# Patient Record
Sex: Male | Born: 1965 | Race: Asian | Hispanic: No | Marital: Married | Smoking: Never smoker
Health system: Southern US, Community
[De-identification: ages and names within clinical notes are randomized; demographics above are authoritative.]

## PROBLEM LIST (undated history)

## (undated) DIAGNOSIS — K4090 Unilateral inguinal hernia, without obstruction or gangrene, not specified as recurrent: Secondary | ICD-10-CM

## (undated) DIAGNOSIS — K429 Umbilical hernia without obstruction or gangrene: Secondary | ICD-10-CM

## (undated) DIAGNOSIS — I10 Essential (primary) hypertension: Secondary | ICD-10-CM

## (undated) DIAGNOSIS — I700 Atherosclerosis of aorta: Secondary | ICD-10-CM

## (undated) DIAGNOSIS — N185 Chronic kidney disease, stage 5: Secondary | ICD-10-CM

## (undated) DIAGNOSIS — N189 Chronic kidney disease, unspecified: Secondary | ICD-10-CM

## (undated) DIAGNOSIS — D509 Iron deficiency anemia, unspecified: Secondary | ICD-10-CM

## (undated) DIAGNOSIS — D689 Coagulation defect, unspecified: Secondary | ICD-10-CM

## (undated) DIAGNOSIS — D631 Anemia in chronic kidney disease: Secondary | ICD-10-CM

## (undated) DIAGNOSIS — N2581 Secondary hyperparathyroidism of renal origin: Secondary | ICD-10-CM

## (undated) DIAGNOSIS — D649 Anemia, unspecified: Secondary | ICD-10-CM

## (undated) DIAGNOSIS — Q613 Polycystic kidney, unspecified: Secondary | ICD-10-CM

## (undated) DIAGNOSIS — N184 Chronic kidney disease, stage 4 (severe): Secondary | ICD-10-CM

## (undated) HISTORY — DX: Essential (primary) hypertension: I10

## (undated) HISTORY — DX: Polycystic kidney, unspecified: Q61.3

## (undated) HISTORY — DX: Coagulation defect, unspecified: D68.9

## (undated) HISTORY — DX: Chronic kidney disease, unspecified: N18.9

---

## 2020-03-21 ENCOUNTER — Other Ambulatory Visit: Payer: Self-pay

## 2020-03-21 ENCOUNTER — Inpatient Hospital Stay
Admission: EM | Admit: 2020-03-21 | Discharge: 2020-03-26 | DRG: 300 | Disposition: A | Discharge status: Home / Self Care | Payer: Self-pay | Attending: Internal Medicine | Admitting: Internal Medicine

## 2020-03-21 ENCOUNTER — Emergency Department: Payer: Self-pay

## 2020-03-21 DIAGNOSIS — I10 Essential (primary) hypertension: Secondary | ICD-10-CM | POA: Diagnosis present

## 2020-03-21 DIAGNOSIS — R0602 Shortness of breath: Secondary | ICD-10-CM

## 2020-03-21 DIAGNOSIS — R81 Glycosuria: Secondary | ICD-10-CM | POA: Diagnosis present

## 2020-03-21 DIAGNOSIS — I7100 Dissection of unspecified site of aorta: Secondary | ICD-10-CM | POA: Diagnosis present

## 2020-03-21 DIAGNOSIS — I161 Hypertensive emergency: Secondary | ICD-10-CM | POA: Diagnosis present

## 2020-03-21 DIAGNOSIS — Z0184 Encounter for antibody response examination: Secondary | ICD-10-CM

## 2020-03-21 DIAGNOSIS — Q612 Polycystic kidney, adult type: Secondary | ICD-10-CM

## 2020-03-21 DIAGNOSIS — T508X5A Adverse effect of diagnostic agents, initial encounter: Secondary | ICD-10-CM | POA: Diagnosis present

## 2020-03-21 DIAGNOSIS — B951 Streptococcus, group B, as the cause of diseases classified elsewhere: Secondary | ICD-10-CM | POA: Diagnosis present

## 2020-03-21 DIAGNOSIS — G479 Sleep disorder, unspecified: Secondary | ICD-10-CM | POA: Diagnosis not present

## 2020-03-21 DIAGNOSIS — Z20822 Contact with and (suspected) exposure to covid-19: Secondary | ICD-10-CM | POA: Diagnosis present

## 2020-03-21 DIAGNOSIS — N179 Acute kidney failure, unspecified: Secondary | ICD-10-CM | POA: Diagnosis present

## 2020-03-21 DIAGNOSIS — Y92239 Unspecified place in hospital as the place of occurrence of the external cause: Secondary | ICD-10-CM | POA: Diagnosis present

## 2020-03-21 DIAGNOSIS — D649 Anemia, unspecified: Secondary | ICD-10-CM | POA: Diagnosis present

## 2020-03-21 DIAGNOSIS — R079 Chest pain, unspecified: Secondary | ICD-10-CM

## 2020-03-21 DIAGNOSIS — I7410 Embolism and thrombosis of unspecified parts of aorta: Secondary | ICD-10-CM

## 2020-03-21 DIAGNOSIS — I700 Atherosclerosis of aorta: Secondary | ICD-10-CM | POA: Diagnosis present

## 2020-03-21 DIAGNOSIS — Z888 Allergy status to other drugs, medicaments and biological substances status: Secondary | ICD-10-CM

## 2020-03-21 DIAGNOSIS — I7101 Dissection of thoracic aorta: Principal | ICD-10-CM | POA: Diagnosis present

## 2020-03-21 HISTORY — DX: Embolism and thrombosis of unspecified parts of aorta: I74.10

## 2020-03-21 LAB — CBC WITH DIFFERENTIAL/PLATELET
Abs Immature Granulocytes: 0.04 10*3/uL (ref 0.00–0.07)
Basophils Absolute: 0 10*3/uL (ref 0.0–0.1)
Basophils Relative: 0 %
Eosinophils Absolute: 0.1 10*3/uL (ref 0.0–0.5)
Eosinophils Relative: 1 %
HCT: 33.5 % — ABNORMAL LOW (ref 39.0–52.0)
Hemoglobin: 10.9 g/dL — ABNORMAL LOW (ref 13.0–17.0)
Immature Granulocytes: 1 %
Lymphocytes Relative: 16 %
Lymphs Abs: 1.4 10*3/uL (ref 0.7–4.0)
MCH: 29.9 pg (ref 26.0–34.0)
MCHC: 32.5 g/dL (ref 30.0–36.0)
MCV: 91.8 fL (ref 80.0–100.0)
Monocytes Absolute: 0.7 10*3/uL (ref 0.1–1.0)
Monocytes Relative: 8 %
Neutro Abs: 6.7 10*3/uL (ref 1.7–7.7)
Neutrophils Relative %: 74 %
Platelets: 165 10*3/uL (ref 150–400)
RBC: 3.65 MIL/uL — ABNORMAL LOW (ref 4.22–5.81)
RDW: 13.3 % (ref 11.5–15.5)
WBC: 8.8 10*3/uL (ref 4.0–10.5)
nRBC: 0 % (ref 0.0–0.2)

## 2020-03-21 LAB — COMPREHENSIVE METABOLIC PANEL
ALT: 17 U/L (ref 0–44)
AST: 23 U/L (ref 15–41)
Albumin: 3.9 g/dL (ref 3.5–5.0)
Alkaline Phosphatase: 40 U/L (ref 38–126)
Anion gap: 7 (ref 5–15)
BUN: 54 mg/dL — ABNORMAL HIGH (ref 6–20)
CO2: 24 mmol/L (ref 22–32)
Calcium: 8.1 mg/dL — ABNORMAL LOW (ref 8.9–10.3)
Chloride: 103 mmol/L (ref 98–111)
Creatinine, Ser: 2.8 mg/dL — ABNORMAL HIGH (ref 0.61–1.24)
GFR, Estimated: 26 mL/min — ABNORMAL LOW (ref >60)
Glucose, Bld: 111 mg/dL — ABNORMAL HIGH (ref 70–99)
Potassium: 3.6 mmol/L (ref 3.5–5.1)
Sodium: 134 mmol/L — ABNORMAL LOW (ref 135–145)
Total Bilirubin: 0.7 mg/dL (ref 0.3–1.2)
Total Protein: 6.9 g/dL (ref 6.5–8.1)

## 2020-03-21 LAB — TROPONIN I (HIGH SENSITIVITY): Troponin I (High Sensitivity): 7 ng/L (ref <18)

## 2020-03-21 MED ORDER — SODIUM CHLORIDE 0.9 % IV BOLUS (SEPSIS)
1000.0000 mL | Freq: Once | INTRAVENOUS | Status: AC
  Administered 2020-03-21: 1000 mL via INTRAVENOUS

## 2020-03-21 MED ORDER — MORPHINE SULFATE (PF) 4 MG/ML IV SOLN
4.0000 mg | Freq: Once | INTRAVENOUS | Status: AC
  Administered 2020-03-21: 4 mg via INTRAVENOUS
  Filled 2020-03-21: qty 1

## 2020-03-21 MED ORDER — ONDANSETRON HCL 4 MG/2ML IJ SOLN
4.0000 mg | Freq: Once | INTRAMUSCULAR | Status: AC
  Administered 2020-03-21: 4 mg via INTRAVENOUS
  Filled 2020-03-21: qty 2

## 2020-03-21 MED ORDER — IOHEXOL 350 MG/ML SOLN
80.0000 mL | Freq: Once | INTRAVENOUS | Status: AC | PRN
  Administered 2020-03-21: 80 mL via INTRAVENOUS

## 2020-03-21 MED ORDER — LABETALOL HCL 5 MG/ML IV SOLN
5.0000 mg | Freq: Once | INTRAVENOUS | Status: AC
  Administered 2020-03-21: 5 mg via INTRAVENOUS
  Filled 2020-03-21: qty 4

## 2020-03-21 NOTE — ED Provider Notes (Addendum)
[facility] Emergency Department Provider Note  ____________________________________________   Event Date/Time   First MD Initiated Contact with Patient 03/21/20 2303     (approximate)  I have reviewed the triage vital signs and the nursing notes.   HISTORY  Chief Complaint Back Pain    HPI Trevor Meyer is a 55 y.o. male with no significant past medical history who presents to the emergency department with complaints of chest, back and abdominal pain that started today.  He states pain started in his thoracic and lower back around 7:30 AM while taking a shower.  He denies any injury to his back, falls.  States pain then radiated into his abdomen, chest and into his throat.  He describes it as sharp, severe.  He has never had pain like this before.  He denies numbness, tingling, focal weakness, bowel or bladder incontinence, urinary retention.  States he has had chills but no fever.  No cough, vomiting or diarrhea.  Denies shortness of breath.  He states pain is worse with lying flat.  No alleviating factors.  He has no chronic medical issues.  No history of aortic pathology for himself or in his family.        History reviewed. No pertinent past medical history.  There are no problems to display for this patient.   History reviewed. No pertinent surgical history.  Prior to Admission medications   Not on File    Allergies Patient has no allergy information on record.  History reviewed. No pertinent family history.  Social History Social History   Tobacco Use  . Smoking status: Never Smoker  . Smokeless tobacco: Never Used  Substance Use Topics  . Alcohol use: Never    Review of Systems Constitutional: No fever. Eyes: No visual changes. ENT: No sore throat. Cardiovascular: + chest pain. Respiratory: Denies shortness of breath. Gastrointestinal: No nausea, vomiting, diarrhea. Genitourinary: Negative for dysuria. Musculoskeletal:  Negative for back pain. Skin: Negative for rash. Neurological: Negative for focal weakness or numbness.  ____________________________________________   PHYSICAL EXAM:  VITAL SIGNS: ED Triage Vitals  Enc Vitals Group     BP 03/21/20 2137 (!) 165/96     Pulse Rate 03/21/20 2137 75     Resp 03/21/20 2137 18     Temp 03/21/20 2137 98.2 F (36.8 C)     Temp Source 03/21/20 2137 Oral     SpO2 03/21/20 2136 100 %     Weight 03/21/20 2138 148 lb (67.1 kg)     Height 03/21/20 2138 '5\' 6"'$  (1.676 m)     Head Circumference --      Peak Flow --      Pain Score 03/21/20 2138 7     Pain Loc --      Pain Edu? --      Excl. in [place]? --    CONSTITUTIONAL: Alert and oriented and responds appropriately to questions. Well-appearing; well-nourished HEAD: Normocephalic EYES: Conjunctivae clear, pupils appear equal, EOM appear intact ENT: normal nose; moist mucous membranes, normal phonation NECK: Supple, normal ROM, no meningismus CARD: RRR; S1 and S2 appreciated; no murmurs, no clicks, no rubs, no gallops RESP: Normal chest excursion without splinting or tachypnea; breath sounds clear and equal bilaterally; no wheezes, no rhonchi, no rales, no hypoxia or respiratory distress, speaking full sentences ABD/GI: Normal bowel sounds; non-distended; soft, non-tender, no rebound, no guarding, no peritoneal signs, no hepatosplenomegaly BACK: The back appears normal, no midline spinal tenderness or step-off or deformity  EXT: Normal ROM in all joints; no deformity noted, no edema; no cyanosis, no calf tenderness or calf swelling, 2+ radial and DP pulses bilaterally SKIN: Normal color for age and race; warm; no rash on exposed skin NEURO: Moves all extremities equally, reports normal sensation diffusely, no saddle anesthesia, normal speech, no facial asymmetry PSYCH: The patient's mood and manner are appropriate.  ____________________________________________   LABS (all labs ordered are listed, but only  abnormal results are displayed)  Labs Reviewed  CBC WITH DIFFERENTIAL/PLATELET - Abnormal; Notable for the following components:      Result Value   RBC 3.65 (*)    Hemoglobin 10.9 (*)    HCT 33.5 (*)    All other components within normal limits  COMPREHENSIVE METABOLIC PANEL - Abnormal; Notable for the following components:   Sodium 134 (*)    Glucose, Bld 111 (*)    BUN 54 (*)    Creatinine, Ser 2.80 (*)    Calcium 8.1 (*)    GFR, Estimated 26 (*)    All other components within normal limits  LIPASE, BLOOD - Abnormal; Notable for the following components:   Lipase 60 (*)    All other components within normal limits  RESP PANEL BY RT-PCR (FLU A&B, COVID) ARPGX2  URINALYSIS, ROUTINE W REFLEX MICROSCOPIC  PROTIME-INR  APTT  TYPE AND SCREEN  TROPONIN I (HIGH SENSITIVITY)  TROPONIN I (HIGH SENSITIVITY)   ____________________________________________  EKG   EKG Interpretation  Date/Time:  Saturday March 21 2020 21:43:40 EST Ventricular Rate:  78 PR Interval:  154 QRS Duration: 80 QT Interval:  362 QTC Calculation: 412 R Axis:   17 Text Interpretation: Normal sinus rhythm Nonspecific T wave abnormality Abnormal ECG No old tracing to compare Confirmed by Pryor Curia [phone]) on 03/21/2020 11:05:37 PM       ____________________________________________  RADIOLOGY Jessie Foot Cartier Mapel, personally viewed and evaluated these images (plain radiographs) as part of my medical decision making, as well as reviewing the written report by the radiologist.  ED MD interpretation: Intramural aortic hematoma  Official radiology report(s): DG Chest 2 View  Result Date: 03/21/2020 CLINICAL DATA:  Thoracic back pain upon awakening this morning, intermittent chest pain EXAM: CHEST - 2 VIEW COMPARISON:  None. FINDINGS: Frontal and lateral views of the chest demonstrate an unremarkable cardiac silhouette. There is ectasia of the thoracic aorta, with prominence of the aortic knob. I do not have  any prior studies for direct comparison. If aortic pathology is suspected, CT angiography of the chest is recommended. No airspace disease, effusion, or pneumothorax. No acute bony abnormalities. IMPRESSION: 1. Dilated ectatic aortic arch. In light of patient's presenting symptoms and lack of comparison studies, CT angiography of the chest may be useful to assess for underlying aortic pathology. 2. No acute airspace disease. Electronically Signed   By: Randa Ngo M.D.   On: 03/21/2020 22:23   CT Angio Chest/Abd/Pel for Dissection W and/or Wo Contrast  Result Date: 03/22/2020 CLINICAL DATA:  Intermittent chest pain EXAM: CT ANGIOGRAPHY CHEST, ABDOMEN AND PELVIS TECHNIQUE: Non-contrast CT of the chest was initially obtained. Multidetector CT imaging through the chest, abdomen and pelvis was performed using the standard protocol during bolus administration of intravenous contrast. Multiplanar reconstructed images and MIPs were obtained and reviewed to evaluate the vascular anatomy. CONTRAST:  40m OMNIPAQUE IOHEXOL 350 MG/ML SOLN COMPARISON:  None. FINDINGS: CTA CHEST FINDINGS Cardiovascular: --Heart: The heart size is normal.  There is nopericardial effusion. --Aorta: There is crescentic hyperdensity around the  left lateral aspect of the thoracic aorta, beginning at the origin of the left subclavian artery and extending to the diaphragmatic hiatus. There is a conventional 3 vessel aortic arch branching pattern. The proximal arch vessels are widely patent. --Pulmonary Arteries: Contrast timing is optimized for preferential opacification of the aorta. Within that limitation, normal central pulmonary arteries. Mediastinum/Nodes: No mediastinal, hilar or axillary lymphadenopathy. The visualized thyroid and thoracic esophageal course are unremarkable. Lungs/Pleura: No pulmonary nodules or masses. No pleural effusion or pneumothorax. No focal airspace consolidation. No focal pleural abnormality. Musculoskeletal: No  chest wall abnormality. No acute osseous findings. Review of the MIP images confirms the above findings. CTA ABDOMEN AND PELVIS FINDINGS VASCULAR Aorta: Normal caliber aorta without aneurysm, dissection, vasculitis or hemodynamically significant stenosis. There is aortic atherosclerosis. Celiac: No aneurysm, dissection or hemodynamically significant stenosis. Normal branching pattern. SMA: Widely patent without dissection or stenosis. Renals: No aneurysm, dissection, stenosis or evidence of fibromuscular dysplasia. IMA: Patent without abnormality. Inflow: No aneurysm, stenosis or dissection. Veins: Normal course and caliber of the major veins. Assessment is otherwise limited by the arterial dominant contrast phase. Review of the MIP images confirms the above findings. NON-VASCULAR Hepatobiliary: Normal hepatic contours and density. No visible biliary dilatation. Normal gallbladder. Pancreas: Normal contours without ductal dilatation. No peripancreatic fluid collection. Spleen: Normal arterial phase splenic enhancement pattern. Adrenals/Urinary Tract: --Adrenal glands: Normal. --Right kidney/ureter: Enlarged with numerous large cysts. --Left kidney/ureter: Enlarged with numerous large cysts. --Urinary bladder: Unremarkable. Stomach/Bowel: --Stomach/Duodenum: No hiatal hernia or other gastric abnormality. Normal duodenal course and caliber. --Small bowel: No dilatation or inflammation. --Colon: No focal abnormality. --Appendix: Normal. Lymphatic:  No abdominal or pelvic lymphadenopathy. Reproductive: Normal prostate and seminal vesicles. Musculoskeletal. No bony spinal canal stenosis or focal osseous abnormality. Other: None. Review of the MIP images confirms the above findings. IMPRESSION: 1. Acute aortic syndrome - intramural hematoma extending from the left subclavian artery origin to the diaphragmatic hiatus. 2. Polycystic kidney disease with numerous large cysts. Aortic Atherosclerosis (ICD10-I70.0). Critical  Value/emergent results were called by telephone at the time of interpretation on 03/22/2020 at 12:31 am to provider [facility] , who verbally acknowledged these results. Electronically Signed   By: Ulyses Jarred M.D.   On: 03/22/2020 00:32    ____________________________________________   PROCEDURES  Procedure(s) performed (including Critical Care):  Procedures  CRITICAL CARE Performed by: Cyril Mourning Chelly Dombeck   Total critical care time: 62 minutes  Critical care time was exclusive of separately billable procedures and treating other patients.  Critical care was necessary to treat or prevent imminent or life-threatening deterioration.  Critical care was time spent personally by me on the following activities: development of treatment plan with patient and/or surrogate as well as nursing, discussions with consultants, evaluation of patient's response to treatment, examination of patient, obtaining history from patient or surrogate, ordering and performing treatments and interventions, ordering and review of laboratory studies, ordering and review of radiographic studies, pulse oximetry and re-evaluation of patient's condition.  ____________________________________________   INITIAL IMPRESSION / ASSESSMENT AND PLAN / ED COURSE  As part of my medical decision making, I reviewed the following data within the [place] History obtained from family, Nursing notes reviewed and incorporated, Interpreter needed, Labs reviewed , EKG interpreted , Old EKG reviewed, Old chart reviewed, Radiograph reviewed , A consult was requested and obtained from this/these consultant(s) Critical care and vascular and Notes from prior ED visits         Patient here with concerning story for  aortic pathology.  Chest x-ray obtained in triage shows an ectatic dilated aortic arch.  I feel he needs emergent CT imaging to rule out dissection, aneurysm, rupture.  He is hypertensive at this time with  equal blood pressures on both upper extremities.  No focal neurologic deficits.  His first troponin is 7.  His EKG is nonischemic.  He has no significant risk factors for ACS.  Doubt PE.  He has no focal neurologic deficits.  Low suspicion for cauda equina, spinal stenosis, epidural abscess or hematoma, discitis or osteomyelitis, transverse myelitis.    He does have a creatinine of 2.8 with no old for comparison.  He denies any known history of kidney disease.  Discussed with patient that this could be due to dissection.  I have counseled him extensively on risk and benefits of CT imaging with contrast with his nurse present at bedside.  Patient speaks English but an interpreter was used to clarify any medical terminology.  We will hydrate patient aggressively but he agrees that without further imaging done emergently that we could be missing life-threatening pathology that needs emergent intervention.  He agrees to CT imaging.  I also discussed this case with Dr. Jacalyn Lefevre with [facility] radiology who will agree is given abnormal chest x-ray, concerning story that patient needs CTA emergently.  CT tech aware of patient and will take him immediately after finishing the study on patient that is currently on the table.    Will give labetalol for elevated blood pressure, morphine for pain.  ED PROGRESS  11:41 PM  Pt transported to CT now.  BP improving.  Pain improving.  12:30 AM  Pt's CT reviewed by myself and radiologist Dr. Collins Scotland.  CT scan shows intramural hematoma extending from the left subclavian artery to the diaphragm without dissection, aneurysm, rupture, ulcer.  He has polycystic kidney disease as well.  Discussed with Dr. Bridgett Larsson on-call for vascular surgery.  He states this would be managed medically like a type B dissection with strict blood pressure control.  We will start him on IV esmolol and add nitroprusside as needed.  He recommends admission to critical care here at [facility].  States that this would  not be surgical at this time.  Recommends repeat imaging in 2 to 3 days as patient may develop a dissection at that time.  He states if patient does need surgical intervention it would be an endograft which she is not sure if this would be able to be done at [facility] or not but feels that given patient does not need emergent surgical evaluation now, he can be admitted here.  12:45 AM Spoke with Olga Millers, NP with critical care who accepts patient for admission.  Patient and family updated.  I suspect that this intramural hematoma is likely due to small intimal tears due to underlying hypertension from his polycystic kidney disease which he was unaware of.  1:30 AM  No significant improvement with esmolol.  We will continue to titrate and bolus as needed.  ICU provider at bedside.  Will likely need to start nitroprusside.  ICU team to assume care.  I reviewed all nursing notes and pertinent previous records as available.  I have reviewed and interpreted any EKGs, lab and urine results, imaging (as available).  ____________________________________________   FINAL CLINICAL IMPRESSION(S) / ED DIAGNOSES  Final diagnoses:  Intramural aortic hematoma [facility])     ED Discharge Orders    None      *Please note:  Trevor  Romel Meyer was evaluated in Emergency Department on 03/22/2020 for the symptoms described in the history of present illness. He was evaluated in the context of the global COVID-19 pandemic, which necessitated consideration that the patient might be at risk for infection with the SARS-CoV-2 virus that causes COVID-19. Institutional protocols and algorithms that pertain to the evaluation of patients at risk for COVID-19 are in a state of rapid change based on information released by regulatory bodies including the CDC and federal and state organizations. These policies and algorithms were followed during the patient's care in the ED.  Some ED evaluations and interventions may be delayed as  a result of limited staffing during and the pandemic.*   Note:  This document was prepared using Dragon voice recognition software and may include unintentional dictation errors.   Marinell Igarashi, Delice Bison, DO 03/22/20 0155    Khamari Sheehan, Delice Bison, DO 03/22/20 [place]:[id number]

## 2020-03-21 NOTE — ED Notes (Addendum)
Pt reports sharp/pressure pain in back, abdomen, and chest. Pt reports that pain began in throat and then moved to the back. Reports SOB when lying down only. Denies any numbness or tingling. Denies recent injury to back. No pain to back on palpation. Pt alert and oriented x4. Denies any cough, fever, or changes in bowel or bladder pattern.

## 2020-03-21 NOTE — ED Triage Notes (Signed)
Thoracic back pain without fall or injury. Reports onset upon waking this morning. Denies hx of back surgeries. Denies numbness or tingling in extremities. Reports non-specific cold like symptoms but also denies cough, congestion, or fever. Denies generalized body aches. Pt alert and oriented x4, breathing unlabored in NAD.

## 2020-03-21 NOTE — ED Notes (Signed)
Bilateral blood pressure obtained. Right arm 188/112. Left arm 188/101.

## 2020-03-21 NOTE — ED Triage Notes (Signed)
Pt now reporting intermittent chest pain and states that when pt lays flat chest pain occurs.

## 2020-03-21 NOTE — ED Notes (Signed)
MD at bedside. Translator used to communicate with pt to explain plan of care and assess.

## 2020-03-22 ENCOUNTER — Encounter: Payer: Self-pay | Admitting: Pulmonary Disease

## 2020-03-22 DIAGNOSIS — I776 Arteritis, unspecified: Secondary | ICD-10-CM

## 2020-03-22 DIAGNOSIS — I7100 Dissection of unspecified site of aorta: Secondary | ICD-10-CM | POA: Diagnosis present

## 2020-03-22 LAB — CBC
HCT: 29.5 % — ABNORMAL LOW (ref 39.0–52.0)
Hemoglobin: 9.6 g/dL — ABNORMAL LOW (ref 13.0–17.0)
MCH: 30.2 pg (ref 26.0–34.0)
MCHC: 32.5 g/dL (ref 30.0–36.0)
MCV: 92.8 fL (ref 80.0–100.0)
Platelets: 135 10*3/uL — ABNORMAL LOW (ref 150–400)
RBC: 3.18 MIL/uL — ABNORMAL LOW (ref 4.22–5.81)
RDW: 13.2 % (ref 11.5–15.5)
WBC: 7.9 10*3/uL (ref 4.0–10.5)
nRBC: 0 % (ref 0.0–0.2)

## 2020-03-22 LAB — LIPASE, BLOOD: Lipase: 60 U/L — ABNORMAL HIGH (ref 11–51)

## 2020-03-22 LAB — TYPE AND SCREEN
ABO/RH(D): B POS
Antibody Screen: NEGATIVE

## 2020-03-22 LAB — BASIC METABOLIC PANEL
Anion gap: 6 (ref 5–15)
BUN: 45 mg/dL — ABNORMAL HIGH (ref 6–20)
CO2: 22 mmol/L (ref 22–32)
Calcium: 7.6 mg/dL — ABNORMAL LOW (ref 8.9–10.3)
Chloride: 109 mmol/L (ref 98–111)
Creatinine, Ser: 2.75 mg/dL — ABNORMAL HIGH (ref 0.61–1.24)
GFR, Estimated: 26 mL/min — ABNORMAL LOW (ref >60)
Glucose, Bld: 130 mg/dL — ABNORMAL HIGH (ref 70–99)
Potassium: 4 mmol/L (ref 3.5–5.1)
Sodium: 137 mmol/L (ref 135–145)

## 2020-03-22 LAB — RESP PANEL BY RT-PCR (FLU A&B, COVID) ARPGX2
Influenza A by PCR: NEGATIVE
Influenza B by PCR: NEGATIVE
SARS Coronavirus 2 by RT PCR: NEGATIVE

## 2020-03-22 LAB — PHOSPHORUS: Phosphorus: 3.2 mg/dL (ref 2.5–4.6)

## 2020-03-22 LAB — APTT: aPTT: 30 seconds (ref 24–36)

## 2020-03-22 LAB — TROPONIN I (HIGH SENSITIVITY): Troponin I (High Sensitivity): 7 ng/L (ref <18)

## 2020-03-22 LAB — GLUCOSE, CAPILLARY
Glucose-Capillary: 99 mg/dL (ref 70–99)
Glucose-Capillary: 138 mg/dL — ABNORMAL HIGH (ref 70–99)
Glucose-Capillary: 143 mg/dL — ABNORMAL HIGH (ref 70–99)
Glucose-Capillary: 115 mg/dL — ABNORMAL HIGH (ref 70–99)
Glucose-Capillary: 121 mg/dL — ABNORMAL HIGH (ref 70–99)

## 2020-03-22 LAB — MRSA PCR SCREENING: MRSA by PCR: NEGATIVE

## 2020-03-22 LAB — HIV ANTIBODY (ROUTINE TESTING W REFLEX): HIV Screen 4th Generation wRfx: NONREACTIVE

## 2020-03-22 LAB — PROTIME-INR
INR: 1.1 (ref 0.8–1.2)
Prothrombin Time: 13.4 seconds (ref 11.4–15.2)

## 2020-03-22 LAB — SEDIMENTATION RATE: Sed Rate: 50 mm/hr — ABNORMAL HIGH (ref 0–20)

## 2020-03-22 LAB — SAR COV2 SEROLOGY (COVID19)AB(IGG),IA: SARS-CoV-2 Ab, IgG: REACTIVE — AB

## 2020-03-22 LAB — MAGNESIUM: Magnesium: 2 mg/dL (ref 1.7–2.4)

## 2020-03-22 LAB — C-REACTIVE PROTEIN: CRP: 4 mg/dL — ABNORMAL HIGH (ref <1.0)

## 2020-03-22 MED ORDER — LABETALOL HCL 100 MG PO TABS
100.0000 mg | ORAL_TABLET | Freq: Two times a day (BID) | ORAL | Status: DC
  Administered 2020-03-22 (×2): 100 mg via ORAL
  Filled 2020-03-22 (×4): qty 1

## 2020-03-22 MED ORDER — ISOSORBIDE MONONITRATE ER 30 MG PO TB24
30.0000 mg | ORAL_TABLET | Freq: Every day | ORAL | Status: DC
  Administered 2020-03-22 – 2020-03-26 (×5): 30 mg via ORAL
  Filled 2020-03-22 (×6): qty 1

## 2020-03-22 MED ORDER — LABETALOL HCL 5 MG/ML IV SOLN: 20.0000 mg | Freq: Once | INTRAVENOUS | Status: AC

## 2020-03-22 MED ORDER — SODIUM NITROPRUSSIDE 25 MG/ML IV SOLN: INTRAVENOUS | Status: DC

## 2020-03-22 MED ORDER — OXYCODONE HCL 5 MG PO TABS
10.0000 mg | ORAL_TABLET | ORAL | Status: DC | PRN
  Administered 2020-03-22: 10 mg via ORAL
  Filled 2020-03-22: qty 2

## 2020-03-22 MED ORDER — ESMOLOL BOLUS VIA INFUSION
500.0000 ug/kg | Freq: Once | INTRAVENOUS | Status: AC
  Administered 2020-03-22: 33550 ug via INTRAVENOUS

## 2020-03-22 MED ORDER — DOCUSATE SODIUM 100 MG PO CAPS: 100.0000 mg | ORAL_CAPSULE | Freq: Two times a day (BID) | ORAL | Status: DC | PRN

## 2020-03-22 MED ORDER — HEPARIN SODIUM (PORCINE) 5000 UNIT/ML IJ SOLN
5000.0000 [IU] | Freq: Three times a day (TID) | INTRAMUSCULAR | Status: DC
  Administered 2020-03-23 – 2020-03-26 (×7): 5000 [IU] via SUBCUTANEOUS
  Filled 2020-03-22 (×8): qty 1

## 2020-03-22 MED ORDER — NITROPRUSSIDE SODIUM-NACL 20-0.9 MG/100ML-% IV SOLN
0.0000 ug/kg/min | INTRAVENOUS | Status: DC
  Administered 2020-03-22: 0.3 ug/kg/min via INTRAVENOUS
  Administered 2020-03-22: 0.4 ug/kg/min via INTRAVENOUS
  Administered 2020-03-23: 1 ug/kg/min via INTRAVENOUS
  Filled 2020-03-22 (×6): qty 100

## 2020-03-22 MED ORDER — ESMOLOL HCL-SODIUM CHLORIDE 2000 MG/100ML IV SOLN
25.0000 ug/kg/min | INTRAVENOUS | Status: DC
  Administered 2020-03-22: 25 ug/kg/min via INTRAVENOUS
  Filled 2020-03-22 (×2): qty 100

## 2020-03-22 MED ORDER — LABETALOL HCL 5 MG/ML IV SOLN
0.5000 mg/min | Status: DC
  Administered 2020-03-22: 0.5 mg/min via INTRAVENOUS
  Administered 2020-03-22: 1 mg/min via INTRAVENOUS
  Filled 2020-03-22 (×2): qty 80

## 2020-03-22 MED ORDER — PANTOPRAZOLE SODIUM 40 MG IV SOLR
40.0000 mg | Freq: Every day | INTRAVENOUS | Status: DC
  Administered 2020-03-22: 40 mg via INTRAVENOUS
  Filled 2020-03-22: qty 40

## 2020-03-22 MED ORDER — CHLORHEXIDINE GLUCONATE CLOTH 2 % EX PADS
6.0000 | MEDICATED_PAD | Freq: Every day | CUTANEOUS | Status: DC
  Administered 2020-03-22 – 2020-03-23 (×2): 6 via TOPICAL

## 2020-03-22 MED ORDER — LABETALOL HCL 5 MG/ML IV SOLN
INTRAVENOUS | Status: AC
  Administered 2020-03-22: 20 mg via INTRAVENOUS
  Filled 2020-03-22: qty 4

## 2020-03-22 MED ORDER — POLYETHYLENE GLYCOL 3350 17 G PO PACK: 17.0000 g | PACK | Freq: Every day | ORAL | Status: DC | PRN

## 2020-03-22 NOTE — H&P (Signed)
NAME:  Trevor Meyer, MRN:  [id number], DOB:  1965/05/04, LOS: 0 ADMISSION DATE:  03/21/2020, CONSULTATION DATE: 03/22/2020 REFERRING MD: Dr. Leonides Schanz, CHIEF COMPLAINT: Back pain  Brief History:  55 year old male with an intramural aortic hematoma, admitted to ICU on a labetalol drip for blood pressure control.  History of Present Illness:  55 year old male with no significant past medical history presenting to the ED from home complaining of chest, back and abdominal pain that started earlier on 03/21/2020.  The patient reports this sharp/severe pain that started in his epigastric area early this morning radiating up his neck making his throat feels sore when swallowing.  He describes this pain as changing over the course of the morning settling more in the upper abdomen radiating to his back at times.  He stated the pain was worse when lying flat making it difficult to rest.  He took 2 doses of Tylenol at home without relief.  He denied any kind of injury or fall.  He denies taking any medication regularly at home or being followed regularly for any chronic medical issues.  No history of aortic pathology for himself or his family, and he states he has not experienced pain like this before. ED course: Chest x-ray obtained in triage showed an ectatic dilated aortic arch, and he was sent by Dr. Leonides Schanz in the ED for a stat CTa to rule out dissection, aneurysm.  CT demonstrated an intramural hematoma extending from the left subclavian artery to the diaphragm without dissection, aneurysm, rupture or ulcer.  Dr. Leonides Schanz discussed with Dr. Bridgett Larsson the on-call vascular surgeon who stated that this would be managed medically similar to a type B dissection with strict blood pressure control.  The patient was given a labetalol push which he responded to well and then started on an esmolol drip to maintain BP less than 120/80.  Dr. Bridgett Larsson did not feel the patient required emergent surgical intervention but may require transfer if at  any point the patient needed an endograft.  Dr. Bridgett Larsson stated he would see the patient as a consulting physician in the morning. Patient was not well controlled on esmolol drip and was transitioned to labetalol with better success. Initial vitals: Afebrile at 98.2, RR 18, NSR at 75, hypertensive at 165/96 with an SPO2 of 96% on room air. Significant labs: AKI with BUN 54/creatinine 2.8, hemoglobin 10.9. PCCM was consulted for admission.  Past Medical History:  No significant history  Significant Hospital Events:  03/22/20-admit to ICU on labetalol drip for BP control  Consults:  Vascular Nephrology  Procedures:    Significant Diagnostic Tests:  03/21/2020 CT angio chest abdomen pelvis >> acute aortic syndrome-intramural hematoma extending from the left subclavian artery origin to the diaphragmatic hiatus.  Polycystic kidney disease with numerous large cysts.  Aortic atherosclerosis.  Micro Data:  03/21/2020 COVID-19 >> negative 03/21/2020 influenza A/B >> negative 03/22/2020 MRSA PCR >> negative  Antimicrobials:     Interim History / Subjective:  Patient lying on bed appearing comfortable, states dull pain in mid-upper abdomen remains 5 out of 10.  Discussed plan of care, wife and son bedside.  No questions or concerns at this time. The results of previous CT scan and vascular surgeon recommendations were covered by Dr. Leonides Schanz with a [place] interpreter.  At the end of this explanation the patient stated he did not need interpreter for any further discussions of care.  Labs/ Imaging personally reviewed Na+/ K+: 134/3.6 BUN/Cr.:  54/2.8 Serum CO2/ AG: 24/7  Hgb: 10.9 WBC/ TMAX: 8.8/afebrile  CXR 03/21/2020: Dilated ectatic or aortic arch, no acute airspace disease Objective   Blood pressure (!) 166/90, pulse 95, temperature 98.2 F (36.8 C), temperature source Oral, resp. rate 16, height '5\' 6"'$  (1.676 m), weight 67.1 kg, SpO2 93 %.       No intake or output data in the 24 hours  ending 03/22/20 0105 Filed Weights   03/21/20 2138  Weight: 67.1 kg    Examination: General: Adult male, lying in bed, NAD HEENT: MM pink/moist, anicteric, atraumatic, neck supple Neuro: A&O x 4, able to follow commands, PERRL +3, MAE CV: s1s2 RRR, NSR on monitor, no r/m/g Pulm: Regular, non labored on room air, breath sounds clear-BUL & clear/diminished-BLL GI: soft, rounded, non tender, bs x 4 Skin: Limited exam- (patient still in street clothes) no rashes/lesions noted Extremities: warm/dry, pulses + 2 R/P, no edema noted  Resolved Hospital Problem list     Assessment & Plan:  Intramural aortic hematoma Per Dr. Bridgett Larsson with vascular surgery this would be medically managed similar to a type B with strict blood pressure control. Patient initially started on esmolol drip with very little impact on blood pressure.  Labetalol push dropped BP quickly and was started as a drip for beta-blockade. -Continuous cardiac monitoring -Strict BP control <120/80 -Continue labetalol drip to maintain BP parameters, initiate nitroprusside as second agent if needed -Oxycodone every 4 hours as needed for pain control -Vascular surgery consulted, appreciate input  Polycystic kidney disease- New finding for patient on CT angio, demonstrating right and left kidneys enlarged with numerous large cysts. Acute Kidney Injury  Cr on admission: 2.8 - Strict I/O's: alert provider if UOP < 0.5 mL/kg/hr - gentle IVF hydration  - Daily BMP, replace electrolytes PRN - Avoid nephrotoxic agents as able, ensure adequate renal perfusion - Nephrology consulted, appreciate input  -BP control as above  Best practice (evaluated daily)  Diet: N.p.o. Pain/Anxiety/Delirium protocol (if indicated): Oxycodone as needed VAP protocol (if indicated): N/A DVT prophylaxis: Heparin SQ GI prophylaxis: Protonix IV Glucose control: Monitor every 4 Mobility: Bedrest Disposition: ICU  Goals of Care:  Last date of  multidisciplinary goals of care discussion: 03/22/2020 Family and staff present: APP, patient, care RN, wife and son Summary of discussion: Plan of care discussed including blood pressure control and vascular surgery consult.  Follow up goals of care discussion due: 03/23/2020 Code Status: Full  Labs   CBC: Recent Labs  Lab 03/21/20 2147  WBC 8.8  NEUTROABS 6.7  HGB 10.9*  HCT 33.5*  MCV 91.8  PLT [id number]    Basic Metabolic Panel: Recent Labs  Lab 03/21/20 2147  NA 134*  K 3.6  CL 103  CO2 24  GLUCOSE 111*  BUN 54*  CREATININE 2.80*  CALCIUM 8.1*   GFR: Estimated Creatinine Clearance: 26.9 mL/min (A) (by C-G formula based on SCr of 2.8 mg/dL (H)). Recent Labs  Lab 03/21/20 2147  WBC 8.8    Liver Function Tests: Recent Labs  Lab 03/21/20 2147  AST 23  ALT 17  ALKPHOS 40  BILITOT 0.7  PROT 6.9  ALBUMIN 3.9   Recent Labs  Lab 03/21/20 2332  LIPASE 60*   No results for input(s): AMMONIA in the last 168 hours.  ABG No results found for: PHART, PCO2ART, PO2ART, HCO3, TCO2, ACIDBASEDEF, O2SAT   Coagulation Profile: No results for input(s): INR, PROTIME in the last 168 hours.  Cardiac Enzymes: No results for input(s): CKTOTAL, CKMB, CKMBINDEX, TROPONINI in the  last 168 hours.  HbA1C: No results found for: HGBA1C  CBG: No results for input(s): GLUCAP in the last 168 hours.  Review of Systems: Positives in bold  Gen: Denies fever, chills, weight change, fatigue, night sweats HEENT: Denies blurred vision, double vision, hearing loss, tinnitus, sinus congestion, rhinorrhea, sore throat, neck stiffness, dysphagia PULM: Denies shortness of breath, cough, sputum production, hemoptysis, wheezing CV: Denies chest pain, edema, orthopnea, paroxysmal nocturnal dyspnea, palpitations, back pain GI: Denies abdominal pain, nausea, vomiting, diarrhea, hematochezia, melena, constipation, change in bowel habits GU: Denies dysuria, hematuria, polyuria, oliguria, urethral  discharge Endocrine: Denies hot or cold intolerance, polyuria, polyphagia or appetite change Derm: Denies rash, dry skin, scaling or peeling skin change Heme: Denies easy bruising, bleeding, bleeding gums Neuro: Denies headache, numbness, weakness, slurred speech, loss of memory or consciousness Past Medical History:  He,  has no past medical history on file.   Surgical History:  History reviewed. No pertinent surgical history.   Social History:   reports that he has never smoked. He has never used smokeless tobacco. He reports that he does not drink alcohol.   Family History:  His family history is not on file.   Allergies Not on File   Home Medications  Prior to Admission medications   Not on File     Critical care time: 45 minutes      Venetia Night, AGACNP-BC Acute Care Nurse Practitioner [place]   [phone] / [phone] Please see Amion for pager details.

## 2020-03-22 NOTE — ED Notes (Signed)
Pt returned from CT °

## 2020-03-22 NOTE — Progress Notes (Signed)
Received patient from [facility], per critical care NP SBP should be under 120, when I got the patient his pressure was > 160, started Labetalol drip and blood pressure began to respond quickly. Blood pressure is now under 120.

## 2020-03-22 NOTE — ED Notes (Signed)
Per Domingo Pulse NP, titrate esmolol by 25 mcg per order every five minutes instead of every ten to decrease pt blood pressures.

## 2020-03-22 NOTE — Progress Notes (Signed)
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 Vascular Surgery was consulted and has recommended BP control and anti-impulse therapy with beta-blockade +/- nitroprusside drip. He is now admitted to the ICU for close titration of these medications and monitoring.  On exam, patient is sleeping comfortably. He is on a labetalol drip (suboptimal response to esmolol) although his BP is still elevated to 175/86. HR is 85.   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 Increase labetalol drip every 10 minutes until target SBP of 120 mmHg is reached. If HR < 60 and target SBP is not yet reached, start nitroprusside drip for isolated antihypertensive effect. Pain control PRN. - Repeat imaging as per Vascular Surgery's recommendation at ~48 hours.  # Respiratory: - NAI  # Renal: - Elevated creatinine: probably CKD (potentially from hypertensive nephropathy) as it is not from renal vascular compromise secondary to dissection (CTA excluded this); hydration and trend Cr for now.  # GI: - NPO for now.  DVT PPX: Heparin [facility] PPX: Not indicated Code Status: Full     Intervention Category Evaluation Type: New Patient Evaluation  Charlott Rakes 03/22/2020, 3:54 AM

## 2020-03-22 NOTE — Consult Note (Addendum)
VASCULAR SURGERY CONSULTATION  Requested by:  Pryor Curia, MD [facility] ED)  Reason for consultation: aortic intramural hematoma    History of Present Illness   Trevor Meyer is a 55 y.o. (Nov 05, 1965) male who presents with cc: chest, back, and abdominal pain.  Pt notes he was getting out of the shower yesterday when he developed pain starting in his abd/thoracic back and spread into chest.  Pt described the pain as sharp and severe.  He noted also some chills associated with this pain.  Pt denies any prior similar episodes.  Patient denies any prior chronic medical problems.  There is no known history of autoimmune disease or COVID exposure recently.   Past Medical History:  None.  Past Surgical History:  None   Social History   Socioeconomic History  . Marital status: Married    Spouse name: Not on file  . Number of children: Not on file  . Years of education: Not on file  . Highest education level: Not on file  Occupational History  . Not on file  Tobacco Use  . Smoking status: Never Smoker  . Smokeless tobacco: Never Used  Substance and Sexual Activity  . Alcohol use: Never  . Drug use: Not on file  . Sexual activity: Not on file  Other Topics Concern  . Not on file  Social History Narrative  . Not on file   Social Determinants of Health   Financial Resource Strain: Not on file  Food Insecurity: Not on file  Transportation Needs: Not on file  Physical Activity: Not on file  Stress: Not on file  Social Connections: Not on file  Intimate Partner Violence: Not on file    Family History: Pt denies any family history of similar sx   Current Facility-Administered Medications  Medication Dose Route Frequency Provider Last Rate Last Admin  . docusate sodium (COLACE) capsule 100 mg  100 mg Oral BID PRN Rust-Chester, Toribio Harbour L, NP      . heparin injection 5,000 Units  5,000 Units Subcutaneous Q8H Rust-Chester, Britton L, NP      . labetalol (NORMODYNE)  infusion 5 mg/mL  0.5-3 mg/min Intravenous Titrated Rust-Chester, Britton L, NP 6 mL/hr at 03/22/20 0935 0.5 mg/min at 03/22/20 0935  . nitroPRUSSide (NIPRIDE) 20 mg in NS 100 mL (0.2 mg/mL) infusion  0-1 mcg/kg/min Intravenous Titrated Rust-Chester, Britton L, NP 8.05 mL/hr at 03/22/20 0935 0.4 mcg/kg/min at 03/22/20 0935  . oxyCODONE (Oxy IR/ROXICODONE) immediate release tablet 10 mg  10 mg Oral Q4H PRN Rust-Chester, Britton L, NP   10 mg at 03/22/20 0159  . pantoprazole (PROTONIX) injection 40 mg  40 mg Intravenous QHS Rust-Chester, Britton L, NP   40 mg at 03/22/20 0159  . polyethylene glycol (MIRALAX / GLYCOLAX) packet 17 g  17 g Oral Daily PRN Rust-Chester, Britton L, NP        No Known Allergies  REVIEW OF SYSTEMS (negative unless checked):   Cardiac:  '[]'$  Chest pain or chest pressure? '[]'$  Shortness of breath upon activity? '[]'$  Shortness of breath when lying flat? '[]'$  Irregular heart rhythm?  Vascular:  '[]'$  Pain in calf, thigh, or hip brought on by walking? '[]'$  Pain in feet at night that wakes you up from your sleep? '[]'$  Blood clot in your veins? '[]'$  Leg swelling?  Pulmonary:  '[]'$  Oxygen at home? '[]'$  Productive cough? '[]'$  Wheezing?  Neurologic:  '[]'$  Sudden weakness in arms or legs? '[]'$  Sudden numbness in arms or legs? '[]'$   Sudden onset of difficult speaking or slurred speech? '[]'$  Temporary loss of vision in one eye? '[]'$  Problems with dizziness?  Gastrointestinal:  '[]'$  Blood in stool? '[]'$  Vomited blood?  Genitourinary:  '[]'$  Burning when urinating? '[]'$  Blood in urine?  Psychiatric:  '[]'$  Major depression  Hematologic:  '[]'$  Bleeding problems? '[]'$  Problems with blood clotting?  Dermatologic:  '[]'$  Rashes or ulcers?  Constitutional:  '[]'$  Fever or chills?  Ear/Nose/Throat:  '[]'$  Change in hearing? '[]'$  Nose bleeds? '[]'$  Sore throat?  Musculoskeletal:  '[x]'$  Back pain? '[]'$  Joint pain? '[]'$  Muscle pain?   Physical Examination     Vitals:   03/22/20 0845 03/22/20 0900 03/22/20 0915  03/22/20 0930  BP: 101/63 (!) 106/57 (!) 113/58 137/75  Pulse: 85 86 86 87  Resp: '14 14 18 15  '$ Temp:      TempSrc:      SpO2: 91% 94% 94% 93%  Weight:      Height:       Body mass index is 25.26 kg/m.  General Alert, O x 3, WD, NAD  Head [place]/AT,    Ear/Nose/ Throat Hearing grossly intact, nares without erythema or drainage, oropharynx without Erythema without Exudate, Mallampati score: 3,   Eyes PERRLA, EOMI,    Neck Supple, mid-line trachea, no LAD  Pulmonary Sym exp, good B air movt, CTA B  Cardiac RRR, Nl S1, S2, no Murmurs, No S3,S4  Vascular Vessel Right Left  Radial Palpable Palpable  Brachial Palpable Palpable  Carotid Palpable, No Bruit Palpable, No Bruit  Aorta Not palpable N/A  Femoral Palpable Palpable  Popliteal Not palpable Not palpable  PT Palpable Palpable  DP Palpable Palpable    Gastro- intestinal soft, non-distended, non-tender to palpation, No guarding or rebound, no HSM, no masses, no CVAT B, No palpable prominent aortic pulse  Musculo- skeletal M/S 5/5 throughout, Extremities without ischemic changes, No edema present,  Neurologic Cranial nerves grossly intact , Pain and light touch intact in extremities , Motor exam as listed above  Psychiatric Judgement intact, Mood & affect appropriate for pt's clinical situation  Dermatologic See M/S exam for extremity exam, No rashes otherwise noted  Lymphatic  Palpable lymph nodes: None    Laboratory   CBC CBC Latest Ref Rng & Units 03/22/2020 03/21/2020  WBC 4.0 - 10.5 K/uL 7.9 8.8  Hemoglobin 13.0 - 17.0 g/dL 9.6(L) 10.9(L)  Hematocrit 39.0 - 52.0 % 29.5(L) 33.5(L)  Platelets 150 - 400 K/uL 135(L) 165    BMP BMP Latest Ref Rng & Units 03/22/2020 03/21/2020  Glucose 70 - 99 mg/dL 130(H) 111(H)  BUN 6 - 20 mg/dL 45(H) 54(H)  Creatinine 0.61 - 1.24 mg/dL 2.75(H) 2.80(H)  Sodium 135 - 145 mmol/L 137 134(L)  Potassium 3.5 - 5.1 mmol/L 4.0 3.6  Chloride 98 - 111 mmol/L 109 103  CO2 22 - 32 mmol/L 22 24   Calcium 8.9 - 10.3 mg/dL 7.6(L) 8.1(L)    Coagulation Lab Results  Component Value Date   INR 1.1 03/22/2020   No results found for: PTT  Lipids No results found for: CHOL, TRIG, HDL, CHOLHDL, VLDL, LDLCALC, LDLDIRECT   Radiology     DG Chest 2 View  Result Date: 03/21/2020 CLINICAL DATA:  Thoracic back pain upon awakening this morning, intermittent chest pain EXAM: CHEST - 2 VIEW COMPARISON:  None. FINDINGS: Frontal and lateral views of the chest demonstrate an unremarkable cardiac silhouette. There is ectasia of the thoracic aorta, with prominence of the aortic knob. I do not have  any prior studies for direct comparison. If aortic pathology is suspected, CT angiography of the chest is recommended. No airspace disease, effusion, or pneumothorax. No acute bony abnormalities. IMPRESSION: 1. Dilated ectatic aortic arch. In light of patient's presenting symptoms and lack of comparison studies, CT angiography of the chest may be useful to assess for underlying aortic pathology. 2. No acute airspace disease. Electronically Signed   By: Randa Ngo M.D.   On: 03/21/2020 22:23   CT Angio Chest/Abd/Pel for Dissection W and/or Wo Contrast  Result Date: 03/22/2020 CLINICAL DATA:  Intermittent chest pain EXAM: CT ANGIOGRAPHY CHEST, ABDOMEN AND PELVIS TECHNIQUE: Non-contrast CT of the chest was initially obtained. Multidetector CT imaging through the chest, abdomen and pelvis was performed using the standard protocol during bolus administration of intravenous contrast. Multiplanar reconstructed images and MIPs were obtained and reviewed to evaluate the vascular anatomy. CONTRAST:  64m OMNIPAQUE IOHEXOL 350 MG/ML SOLN COMPARISON:  None. FINDINGS: CTA CHEST FINDINGS Cardiovascular: --Heart: The heart size is normal.  There is nopericardial effusion. --Aorta: There is crescentic hyperdensity around the left lateral aspect of the thoracic aorta, beginning at the origin of the left subclavian artery and  extending to the diaphragmatic hiatus. There is a conventional 3 vessel aortic arch branching pattern. The proximal arch vessels are widely patent. --Pulmonary Arteries: Contrast timing is optimized for preferential opacification of the aorta. Within that limitation, normal central pulmonary arteries. Mediastinum/Nodes: No mediastinal, hilar or axillary lymphadenopathy. The visualized thyroid and thoracic esophageal course are unremarkable. Lungs/Pleura: No pulmonary nodules or masses. No pleural effusion or pneumothorax. No focal airspace consolidation. No focal pleural abnormality. Musculoskeletal: No chest wall abnormality. No acute osseous findings. Review of the MIP images confirms the above findings. CTA ABDOMEN AND PELVIS FINDINGS VASCULAR Aorta: Normal caliber aorta without aneurysm, dissection, vasculitis or hemodynamically significant stenosis. There is aortic atherosclerosis. Celiac: No aneurysm, dissection or hemodynamically significant stenosis. Normal branching pattern. SMA: Widely patent without dissection or stenosis. Renals: No aneurysm, dissection, stenosis or evidence of fibromuscular dysplasia. IMA: Patent without abnormality. Inflow: No aneurysm, stenosis or dissection. Veins: Normal course and caliber of the major veins. Assessment is otherwise limited by the arterial dominant contrast phase. Review of the MIP images confirms the above findings. NON-VASCULAR Hepatobiliary: Normal hepatic contours and density. No visible biliary dilatation. Normal gallbladder. Pancreas: Normal contours without ductal dilatation. No peripancreatic fluid collection. Spleen: Normal arterial phase splenic enhancement pattern. Adrenals/Urinary Tract: --Adrenal glands: Normal. --Right kidney/ureter: Enlarged with numerous large cysts. --Left kidney/ureter: Enlarged with numerous large cysts. --Urinary bladder: Unremarkable. Stomach/Bowel: --Stomach/Duodenum: No hiatal hernia or other gastric abnormality. Normal  duodenal course and caliber. --Small bowel: No dilatation or inflammation. --Colon: No focal abnormality. --Appendix: Normal. Lymphatic:  No abdominal or pelvic lymphadenopathy. Reproductive: Normal prostate and seminal vesicles. Musculoskeletal. No bony spinal canal stenosis or focal osseous abnormality. Other: None. Review of the MIP images confirms the above findings. IMPRESSION: 1. Acute aortic syndrome - intramural hematoma extending from the left subclavian artery origin to the diaphragmatic hiatus. 2. Polycystic kidney disease with numerous large cysts. Aortic Atherosclerosis (ICD10-I70.0). Critical Value/emergent results were called by telephone at the time of interpretation on 03/22/2020 at 12:31 am [facility] who verbally acknowledged these results. Electronically Signed   By: KUlyses JarredM.D.   On: 03/22/2020 00:32     Medical Decision Making   VBjarne Fredais a 55y.o. male who presents with: intramural hematoma (IMH) vs aortitis   Over the last two year,  I have anecdotally notice an uptick in aortitis cases.  There also appears to be a COVID related vasculitis documented in the literature.  Pt's lack of co-morbidities suggests that the usual atherosclerotic penetrating aortic ulcer (PAU) mechanism for IMH development is less likely  CTA chest/abd/pelvis does not demonstrate any significant amount atherosclerotic plaque or aortic dissection  Regardless, impulse control with IV beta-blocker with transition to oral agent recommended  Vasculitis work-up with involvement of Rheumatology recommended  If initial labwork consistent with such, steroid use likely beneficial.  HTN might resolve as aortitis improves, attenuating the need for anti-HTN regimen  Dr. Lucky Cowboy and Ronalee Belts will be back tomorrow  Thank you for allowing [place] to participate in this patient's care.  Adele Barthel, MD, FACS, FSVS Covering for [facility]: [phone]  03/22/2020,  9:55 AM

## 2020-03-23 LAB — CBC WITH DIFFERENTIAL/PLATELET
Abs Immature Granulocytes: 0.04 10*3/uL (ref 0.00–0.07)
Basophils Absolute: 0 10*3/uL (ref 0.0–0.1)
Basophils Relative: 0 %
Eosinophils Absolute: 0.1 10*3/uL (ref 0.0–0.5)
Eosinophils Relative: 1 %
HCT: 29.3 % — ABNORMAL LOW (ref 39.0–52.0)
Hemoglobin: 9.8 g/dL — ABNORMAL LOW (ref 13.0–17.0)
Immature Granulocytes: 1 %
Lymphocytes Relative: 13 %
Lymphs Abs: 0.9 10*3/uL (ref 0.7–4.0)
MCH: 30.2 pg (ref 26.0–34.0)
MCHC: 33.4 g/dL (ref 30.0–36.0)
MCV: 90.2 fL (ref 80.0–100.0)
Monocytes Absolute: 0.7 10*3/uL (ref 0.1–1.0)
Monocytes Relative: 11 %
Neutro Abs: 4.8 10*3/uL (ref 1.7–7.7)
Neutrophils Relative %: 74 %
Platelets: 140 10*3/uL — ABNORMAL LOW (ref 150–400)
RBC: 3.25 MIL/uL — ABNORMAL LOW (ref 4.22–5.81)
RDW: 13.3 % (ref 11.5–15.5)
WBC: 6.6 10*3/uL (ref 4.0–10.5)
nRBC: 0 % (ref 0.0–0.2)

## 2020-03-23 LAB — COMPREHENSIVE METABOLIC PANEL
ALT: 13 U/L (ref 0–44)
AST: 15 U/L (ref 15–41)
Albumin: 3.1 g/dL — ABNORMAL LOW (ref 3.5–5.0)
Alkaline Phosphatase: 32 U/L — ABNORMAL LOW (ref 38–126)
Anion gap: 7 (ref 5–15)
BUN: 36 mg/dL — ABNORMAL HIGH (ref 6–20)
CO2: 23 mmol/L (ref 22–32)
Calcium: 8 mg/dL — ABNORMAL LOW (ref 8.9–10.3)
Chloride: 111 mmol/L (ref 98–111)
Creatinine, Ser: 2.74 mg/dL — ABNORMAL HIGH (ref 0.61–1.24)
GFR, Estimated: 27 mL/min — ABNORMAL LOW (ref >60)
Glucose, Bld: 117 mg/dL — ABNORMAL HIGH (ref 70–99)
Potassium: 3.7 mmol/L (ref 3.5–5.1)
Sodium: 141 mmol/L (ref 135–145)
Total Bilirubin: 1 mg/dL (ref 0.3–1.2)
Total Protein: 6 g/dL — ABNORMAL LOW (ref 6.5–8.1)

## 2020-03-23 LAB — MAGNESIUM: Magnesium: 2 mg/dL (ref 1.7–2.4)

## 2020-03-23 LAB — PHOSPHORUS: Phosphorus: 2.7 mg/dL (ref 2.5–4.6)

## 2020-03-23 MED ORDER — NITROPRUSSIDE SODIUM 25 MG/ML IV SOLN
0.0000 ug/kg/min | INTRAVENOUS | Status: DC
  Administered 2020-03-23: 1 ug/kg/min via INTRAVENOUS
  Filled 2020-03-23 (×3): qty 2

## 2020-03-23 MED ORDER — LABETALOL HCL 200 MG PO TABS
200.0000 mg | ORAL_TABLET | Freq: Two times a day (BID) | ORAL | Status: DC
  Administered 2020-03-23 – 2020-03-26 (×6): 200 mg via ORAL
  Filled 2020-03-23 (×7): qty 1

## 2020-03-23 MED ORDER — NITROPRUSSIDE SODIUM-NACL 20-0.9 MG/100ML-% IV SOLN: 0.0000 ug/kg/min | INTRAVENOUS | Status: DC

## 2020-03-23 MED ORDER — HYDRALAZINE HCL 20 MG/ML IJ SOLN
20.0000 mg | Freq: Once | INTRAMUSCULAR | Status: AC
  Administered 2020-03-23: 20 mg via INTRAVENOUS
  Filled 2020-03-23: qty 1

## 2020-03-23 MED ORDER — LABETALOL HCL 100 MG PO TABS
100.0000 mg | ORAL_TABLET | Freq: Once | ORAL | Status: AC
  Administered 2020-03-23: 100 mg via ORAL
  Filled 2020-03-23: qty 1

## 2020-03-23 MED ORDER — PANTOPRAZOLE SODIUM 40 MG IV SOLR
40.0000 mg | Freq: Every day | INTRAVENOUS | Status: DC
  Administered 2020-03-23: 40 mg via INTRAVENOUS
  Filled 2020-03-23: qty 40

## 2020-03-23 MED ORDER — NITROPRUSSIDE SODIUM-NACL 20-0.9 MG/100ML-% IV SOLN
0.0000 ug/kg/min | INTRAVENOUS | Status: DC
  Filled 2020-03-23: qty 100

## 2020-03-23 MED ORDER — CLONIDINE HCL 0.1 MG PO TABS
0.1000 mg | ORAL_TABLET | Freq: Two times a day (BID) | ORAL | Status: DC
  Administered 2020-03-23 – 2020-03-25 (×4): 0.1 mg via ORAL
  Filled 2020-03-23 (×4): qty 1

## 2020-03-23 MED ORDER — LABETALOL HCL 5 MG/ML IV SOLN
10.0000 mg | INTRAVENOUS | Status: DC | PRN
  Administered 2020-03-23 (×2): 10 mg via INTRAVENOUS
  Filled 2020-03-23 (×2): qty 4

## 2020-03-23 MED ORDER — AMLODIPINE BESYLATE 10 MG PO TABS
10.0000 mg | ORAL_TABLET | Freq: Every day | ORAL | Status: DC
  Administered 2020-03-23 – 2020-03-26 (×4): 10 mg via ORAL
  Filled 2020-03-23 (×4): qty 1

## 2020-03-23 MED ORDER — POTASSIUM CHLORIDE CRYS ER 20 MEQ PO TBCR
40.0000 meq | EXTENDED_RELEASE_TABLET | Freq: Once | ORAL | Status: AC
  Administered 2020-03-23: 40 meq via ORAL
  Filled 2020-03-23: qty 2

## 2020-03-23 NOTE — Progress Notes (Signed)
Noticed patient kneeling in bed, went to check that he was ok, he stated that he was just urinating. Then he stated that it hurts to urinate, asked where it hurt when he urinated he stated that his penis hurt when he urinated, notified NP of symptom, received order for UA.

## 2020-03-23 NOTE — Progress Notes (Signed)
Late note: Given prns for high B/Pall day. Goal today was SBP of 120s. Nipride remains at 20 mls or 1 mcg/kg/min. B/P remains in 140s SBP. States he has felt bad all day. Dr Mortimer Fries notified. Prn ordered.

## 2020-03-23 NOTE — Progress Notes (Signed)
At [place] patient received ordered dose of Hydralazine, at 1925 he complained of flushed feeling in face and feeling hot all over. Provided cool wash cloth and ice bag, then notified critical care NP of reaction.

## 2020-03-23 NOTE — Progress Notes (Signed)
PHARMACY CONSULT NOTE - FOLLOW UP  Pharmacy Consult for Electrolyte Monitoring and Replacement   Recent Labs: Potassium (mmol/L)  Date Value  03/23/2020 3.7   Magnesium (mg/dL)  Date Value  03/23/2020 2.0   Calcium (mg/dL)  Date Value  03/23/2020 8.0 (L)   Albumin (g/dL)  Date Value  03/23/2020 3.1 (L)   Phosphorus (mg/dL)  Date Value  03/23/2020 2.7   Sodium (mmol/L)  Date Value  03/23/2020 141   Corrected Ca: 8.72  Assessment: 55 yo M presented with chest, back and abdominal pain. 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 Pt with no pertinent PMH. Concern for aortitis or COVID-related vasculitis.  Goal of Therapy:  K 4.0-5.1 Mg 2.0-2.4 All other electrolytes WNL  Plan:  --K 3.7 - Ordered KCl 40 mEq PO x 1 dose --All other electrolytes WNL  --Monitor electrolytes with AM labs  Benn Moulder, PharmD Pharmacy Resident  03/23/2020 2:30 PM

## 2020-03-24 LAB — CBC
HCT: 30.7 % — ABNORMAL LOW (ref 39.0–52.0)
Hemoglobin: 10.4 g/dL — ABNORMAL LOW (ref 13.0–17.0)
MCH: 30.4 pg (ref 26.0–34.0)
MCHC: 33.9 g/dL (ref 30.0–36.0)
MCV: 89.8 fL (ref 80.0–100.0)
Platelets: 147 10*3/uL — ABNORMAL LOW (ref 150–400)
RBC: 3.42 MIL/uL — ABNORMAL LOW (ref 4.22–5.81)
RDW: 13.3 % (ref 11.5–15.5)
WBC: 6.2 10*3/uL (ref 4.0–10.5)
nRBC: 0 % (ref 0.0–0.2)

## 2020-03-24 LAB — BASIC METABOLIC PANEL
Anion gap: 5 (ref 5–15)
BUN: 32 mg/dL — ABNORMAL HIGH (ref 6–20)
CO2: 23 mmol/L (ref 22–32)
Calcium: 8.2 mg/dL — ABNORMAL LOW (ref 8.9–10.3)
Chloride: 109 mmol/L (ref 98–111)
Creatinine, Ser: 2.53 mg/dL — ABNORMAL HIGH (ref 0.61–1.24)
GFR, Estimated: 29 mL/min — ABNORMAL LOW (ref >60)
Glucose, Bld: 125 mg/dL — ABNORMAL HIGH (ref 70–99)
Potassium: 3.8 mmol/L (ref 3.5–5.1)
Sodium: 137 mmol/L (ref 135–145)

## 2020-03-24 LAB — URINALYSIS, ROUTINE W REFLEX MICROSCOPIC
Bilirubin Urine: NEGATIVE
Glucose, UA: 50 mg/dL — AB
Hgb urine dipstick: NEGATIVE
Ketones, ur: NEGATIVE mg/dL
Leukocytes,Ua: NEGATIVE
Nitrite: NEGATIVE
Protein, ur: NEGATIVE mg/dL
Specific Gravity, Urine: 1.009 (ref 1.005–1.030)
pH: 6 (ref 5.0–8.0)

## 2020-03-24 LAB — IGG 4: IgG, Subclass 4: 42 mg/dL (ref 2–96)

## 2020-03-24 LAB — PHOSPHORUS: Phosphorus: 2.5 mg/dL (ref 2.5–4.6)

## 2020-03-24 LAB — RPR: RPR Ser Ql: NONREACTIVE

## 2020-03-24 LAB — MAGNESIUM: Magnesium: 1.9 mg/dL (ref 1.7–2.4)

## 2020-03-24 MED ORDER — PANTOPRAZOLE SODIUM 40 MG PO TBEC
40.0000 mg | DELAYED_RELEASE_TABLET | Freq: Every day | ORAL | Status: DC
  Administered 2020-03-24 – 2020-03-25 (×2): 40 mg via ORAL
  Filled 2020-03-24 (×2): qty 1

## 2020-03-24 MED ORDER — POTASSIUM CHLORIDE CRYS ER 20 MEQ PO TBCR
40.0000 meq | EXTENDED_RELEASE_TABLET | Freq: Once | ORAL | Status: AC
  Administered 2020-03-24: 40 meq via ORAL
  Filled 2020-03-24: qty 2

## 2020-03-24 MED ORDER — MAGNESIUM SULFATE 2 GM/50ML IV SOLN
2.0000 g | Freq: Once | INTRAVENOUS | Status: AC
  Administered 2020-03-24: 2 g via INTRAVENOUS
  Filled 2020-03-24: qty 50

## 2020-03-24 MED ORDER — NICARDIPINE HCL IN NACL 20-0.86 MG/200ML-% IV SOLN
3.0000 mg/h | INTRAVENOUS | Status: DC
  Administered 2020-03-24: 3 mg/h via INTRAVENOUS
  Administered 2020-03-25: 5 mg/h via INTRAVENOUS
  Filled 2020-03-24 (×3): qty 200

## 2020-03-24 NOTE — H&P (Signed)
NAME:  Trevor Meyer, MRN:  [id number], DOB:  [birth year], LOS: 2 ADMISSION DATE:  03/21/2020, CONSULTATION DATE: 03/22/2020 REFERRING MD: Dr. Leonides Schanz, CHIEF COMPLAINT: Back pain  Brief History:  55 year old male with an intramural aortic hematoma, admitted to ICU on a labetalol drip for blood pressure control.  History of Present Illness:  55 year old male with no significant past medical history presenting to the ED from home complaining of chest, back and abdominal pain that started earlier on 03/21/2020.  The patient reports this sharp/severe pain that started in his epigastric area early this morning radiating up his neck making his throat feels sore when swallowing.  He describes this pain as changing over the course of the morning settling more in the upper abdomen radiating to his back at times.  He stated the pain was worse when lying flat making it difficult to rest.  He took 2 doses of Tylenol at home without relief.  He denied any kind of injury or fall.  He denies taking any medication regularly at home or being followed regularly for any chronic medical issues.  No history of aortic pathology for himself or his family, and he states he has not experienced pain like this before. ED course: Chest x-ray obtained in triage showed an ectatic dilated aortic arch, and he was sent by Dr. Leonides Schanz in the ED for a stat CTa to rule out dissection, aneurysm.  CT demonstrated an intramural hematoma extending from the left subclavian artery to the diaphragm without dissection, aneurysm, rupture or ulcer.  Dr. Leonides Schanz discussed with Dr. Bridgett Larsson the on-call vascular surgeon who stated that this would be managed medically similar to a type B dissection with strict blood pressure control.  The patient was given a labetalol push which he responded to well and then started on an esmolol drip to maintain BP less than 120/80.  Dr. Bridgett Larsson did not feel the patient required emergent surgical intervention but may require transfer if at  any point the patient needed an endograft.  Dr. Bridgett Larsson stated he would see the patient as a consulting physician in the morning. Patient was not well controlled on esmolol drip and was transitioned to labetalol with better success. Initial vitals: Afebrile at 98.2, RR 18, NSR at 75, hypertensive at 165/96 with an SPO2 of 96% on room air. Significant labs: AKI with BUN 54/creatinine 2.8, hemoglobin 10.9. PCCM was consulted for admission.  Past Medical History:  No significant history  Significant Hospital Events:  03/22/20-admit to ICU on labetalol drip for BP control 3/15 remains HTN Consults:  Vascular Nephrology  Procedures:    Significant Diagnostic Tests:  03/21/2020 CT angio chest abdomen pelvis >> acute aortic syndrome-intramural hematoma extending from the left subclavian artery origin to the diaphragmatic hiatus.  Polycystic kidney disease with numerous large cysts.  Aortic atherosclerosis.  Micro Data:  03/21/2020 COVID-19 >> negative 03/21/2020 influenza A/B >> negative 03/22/2020 MRSA PCR >> negative  Antimicrobials:     Interim History / Subjective:  Patient lying on bed appearing comfortable,  Elevated BP Remains on Nitro drip labetolol increased 200 mg BID added norvasc    Objective   Blood pressure (!) 142/88, pulse 87, temperature 98.4 F (36.9 C), temperature source Oral, resp. rate (!) 23, height '5\' 6"'$  (1.676 m), weight 71 kg, SpO2 95 %.        Intake/Output Summary (Last 24 hours) at 03/24/2020 0915 Last data filed at 03/24/2020 0844 Gross per 24 hour  Intake 480 ml  Output 1100 ml  Net -620 ml   Filed Weights   03/21/20 2138 03/22/20 0418  Weight: 67.1 kg 71 kg    Review of Systems:  Gen:  Denies  fever, sweats, chills weight loss  HEENT: Denies blurred vision, double vision, ear pain, eye pain, hearing loss, nose bleeds, sore throat Cardiac:  No dizziness, chest pain or heaviness, chest tightness,edema, No JVD Resp:   No cough, -sputum  production, -shortness of breath,-wheezing, -hemoptysis,  Gi: Denies swallowing difficulty, stomach pain, nausea or vomiting, diarrhea, constipation, bowel incontinence Gu:  Denies bladder incontinence, burning urine Ext:   Denies Joint pain, stiffness or swelling Skin: Denies  skin rash, easy bruising or bleeding or hives Endoc:  Denies polyuria, polydipsia , polyphagia or weight change Psych:   Denies depression, insomnia or hallucinations  Other:  All other systems negative Physical Examination:   General Appearance: No distress  Neuro:without focal findings,  speech normal,  HEENT: PERRLA, EOM intact.   Pulmonary: normal breath sounds, No wheezing.  CardiovascularNormal S1,S2.  No m/r/g.   Abdomen: Benign, Soft, non-tender. Renal:  No costovertebral tenderness  GU:  Not performed at this time. Endoc: No evident thyromegaly Skin:   warm, no rashes, no ecchymosis  Extremities: normal, no cyanosis, clubbing. PSYCHIATRIC: Mood, affect within normal limits.   ALL OTHER ROS ARE NEGATIVE   Resolved Hospital Problem list     Assessment & Plan:  Intramural aortic hematoma vasc surgery-medically managed similar to a type B with strict blood pressure control. -Continuous cardiac monitoring -Strict BP control <120/80  nitroprusside-wean as tolerated -Vascular surgery consulted, appreciate input  Polycystic kidney disease-  New finding for patient on CT angio, demonstrating right and left kidneys enlarged with numerous large cysts.   ACUTE KIDNEY INJURY/Renal Failure -continue Foley Catheter-assess need -Avoid nephrotoxic agents -Follow urine output, BMP -Ensure adequate renal perfusion, optimize oxygenation -Renal dose medications Nephrology consulted   DVT/GI PRX ordered and assessed TRANSFUSIONS AS NEEDED MONITOR FSBS I Assessed the need for Labs I Assessed the need for Foley I Assessed the need for Central Venous Line Family Discussion when available I Assessed  the need for Mobilization I made an Assessment of medications to be adjusted accordingly Safety Risk assessment completed  CASE DISCUSSED IN MULTIDISCIPLINARY ROUNDS WITH ICU TEAM     Labs   CBC: Recent Labs  Lab 03/21/20 2147 03/22/20 0501 03/23/20 0531 03/24/20 0455  WBC 8.8 7.9 6.6 6.2  NEUTROABS 6.7  --  4.8  --   HGB 10.9* 9.6* 9.8* 10.4*  HCT 33.5* 29.5* 29.3* 30.7*  MCV 91.8 92.8 90.2 89.8  PLT 165 135* 140* 147*    Basic Metabolic Panel: Recent Labs  Lab 03/21/20 2147 03/22/20 0501 03/23/20 0531 03/24/20 0455  NA 134* 137 141 137  K 3.6 4.0 3.7 3.8  CL 103 109 111 109  CO2 '24 22 23 23  '$ GLUCOSE 111* 130* 117* 125*  BUN 54* 45* 36* 32*  CREATININE 2.80* 2.75* 2.74* 2.53*  CALCIUM 8.1* 7.6* 8.0* 8.2*  MG  --  2.0 2.0 1.9  PHOS  --  3.2 2.7 2.5   GFR: Estimated Creatinine Clearance: 29.8 mL/min (A) (by C-G formula based on SCr of 2.53 mg/dL (H)). Recent Labs  Lab 03/21/20 2147 03/22/20 0501 03/23/20 0531 03/24/20 0455  WBC 8.8 7.9 6.6 6.2    Liver Function Tests: Recent Labs  Lab 03/21/20 2147 03/23/20 0531  AST 23 15  ALT 17 13  ALKPHOS 40 32*  BILITOT 0.7 1.0  PROT 6.9  6.0*  ALBUMIN 3.9 3.1*   Recent Labs  Lab 03/21/20 2332  LIPASE 60*   No results for input(s): AMMONIA in the last 168 hours.  ABG No results found for: PHART, PCO2ART, PO2ART, HCO3, TCO2, ACIDBASEDEF, O2SAT   Coagulation Profile: Recent Labs  Lab 03/22/20 0101  INR 1.1    Cardiac Enzymes: No results for input(s): CKTOTAL, CKMB, CKMBINDEX, TROPONINI in the last 168 hours.  HbA1C: No results found for: HGBA1C  CBG: Recent Labs  Lab 03/22/20 0316 03/22/20 0754 03/22/20 1115 03/22/20 1529 03/22/20 1922  GLUCAP 99 138* 143* 115* 121*     Allergies Allergies  Allergen Reactions  . Hydralazine Hcl Other (See Comments)    Flushing and tachycardia     Home Medications  Prior to Admission medications   Not on File    Remains SD status  Sheray Grist  Patricia Pesa, M.D.  [facility]  Medical Director [place] Director [facility] Cardio-Pulmonary Department

## 2020-03-24 NOTE — Progress Notes (Signed)
Pt's SBP 109 w/ Cardene at 3 mg/hr, reduced to 1 mg/hr.  SBP 125.  Turned Cardene off, SBP in low 130s.  Per Dr Candiss Norse ok to leave off for now (cardene SBP goal is 125) and see how patient does with his PO meds.  Per Dr Mortimer Fries tx to stepdown status

## 2020-03-24 NOTE — Progress Notes (Signed)
PHARMACY CONSULT NOTE - FOLLOW UP  Pharmacy Consult for Electrolyte Monitoring and Replacement   Recent Labs: Potassium (mmol/L)  Date Value  03/24/2020 3.8   Magnesium (mg/dL)  Date Value  03/24/2020 1.9   Calcium (mg/dL)  Date Value  03/24/2020 8.2 (L)   Albumin (g/dL)  Date Value  03/23/2020 3.1 (L)   Phosphorus (mg/dL)  Date Value  03/24/2020 2.5   Sodium (mmol/L)  Date Value  03/24/2020 137   Corrected Ca: 8.92  Assessment: 55 yo M presented with chest, back and abdominal pain. 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 Pt with no pertinent PMH. Concern for aortitis or COVID-related vasculitis.  Goal of Therapy:  K 4.0-5.1 Mg 2.0-2.4 All other electrolytes WNL  Plan:  --K 3.8 - Ordered KCl 40 mEq PO x 1 dose --Mg 1.9 - Ordered Mg 2g IV x 1 dose --All other electrolytes WNL  --Monitor electrolytes with AM labs  Benn Moulder, PharmD Pharmacy Resident  03/24/2020 7:15 AM

## 2020-03-24 NOTE — Consult Note (Signed)
[facility] Consult Note: Date of consult 03/24/20    Date of Admission:  03/21/2020           Reason for Consult: AKI    Referring Provider: Flora Lipps, MD Primary Care Provider: Patient, No Pcp Per   History of Presenting Illness:  Trevor Meyer is a 55 y.o. male no known past medical problems.  No care everywhere records.  He presents to the emergency room from home complaining of chest, back and abdominal pain which was sharp.  Localized in the epigastric area radiating up his neck. Patient underwent CT angiogram of the chest abdomen and pelvis on March 13.  Results show acute aortic syndrome-intramural hematoma extending from the left subclavian artery origin to the diaphragmatic hiatus.  Polycystic kidneys with numerous large cysts. No baseline creatinine information is available Admission creatinine was 2.80, which is slowly improving on a daily basis.  Today's creatinine is down to 2.5 today.  Patient is also noted to be anemic with hemoglobin of 10.4 ESR elevated at 50. C-reactive protein, RPR, ESR, HIV tests have been ordered and results are pending  Review of Systems: Review of Systems  Constitutional: Negative.   HENT: Negative.   Eyes: Negative.   Respiratory: Negative.   Cardiovascular: Negative.   Gastrointestinal: Negative.   Genitourinary: Negative.   Musculoskeletal: Negative.   Skin: Negative.   Neurological: Negative.   Endo/Heme/Allergies: Negative.   Psychiatric/Behavioral: Negative.     History reviewed. No pertinent past medical history.  Social History   Tobacco Use  . Smoking status: Never Smoker  . Smokeless tobacco: Never Used  Substance Use Topics  . Alcohol use: Never    History reviewed. No pertinent family history.   OBJECTIVE: Blood pressure (!) 142/88, pulse 87, temperature 98.4 F (36.9 C), temperature source Oral, resp. rate (!) 23, height 5' 6"  (1.676 m), weight 71 kg, SpO2 95 %.  Physical  Exam Constitutional:      Appearance: Normal appearance.  HENT:     Head: Normocephalic.     Nose: Nose normal.     Mouth/Throat:     Mouth: Mucous membranes are moist.     Pharynx: Oropharynx is clear.  Eyes:     Extraocular Movements: Extraocular movements intact.     Conjunctiva/sclera: Conjunctivae normal.  Cardiovascular:     Rate and Rhythm: Normal rate and regular rhythm.     Heart sounds: Normal heart sounds.  Pulmonary:     Effort: Pulmonary effort is normal.  Abdominal:     General: Abdomen is flat.     Palpations: Abdomen is soft.  Musculoskeletal:     Cervical back: Normal range of motion and neck supple.  Skin:    General: Skin is warm and dry.  Neurological:     General: No focal deficit present.     Mental Status: He is alert and oriented to person, place, and time.  Psychiatric:        Mood and Affect: Mood normal.        Behavior: Behavior normal.     Lab Results Lab Results  Component Value Date   WBC 6.2 03/24/2020   HGB 10.4 (L) 03/24/2020   HCT 30.7 (L) 03/24/2020   MCV 89.8 03/24/2020   PLT 147 (L) 03/24/2020    Lab Results  Component Value Date   CREATININE 2.53 (H) 03/24/2020   BUN 32 (H) 03/24/2020   NA 137 03/24/2020   K 3.8 03/24/2020   CL 109  03/24/2020   CO2 23 03/24/2020    Lab Results  Component Value Date   ALT 13 03/23/2020   AST 15 03/23/2020   ALKPHOS 32 (L) 03/23/2020   BILITOT 1.0 03/23/2020     Microbiology: Recent Results (from the past 240 hour(s))  Resp Panel by RT-PCR (Flu A&B, Covid) Nasopharyngeal Swab     Status: None   Collection Time: 03/21/20 11:32 PM   Specimen: Nasopharyngeal Swab; Nasopharyngeal(NP) swabs in vial transport medium  Result Value Ref Range Status   SARS Coronavirus 2 by RT PCR NEGATIVE NEGATIVE Final    Comment: (NOTE) SARS-CoV-2 target nucleic acids are NOT DETECTED.  The SARS-CoV-2 RNA is generally detectable in upper respiratory specimens during the acute phase of infection. The  lowest concentration of SARS-CoV-2 viral copies this assay can detect is 138 copies/mL. A negative result does not preclude SARS-Cov-2 infection and should not be used as the sole basis for treatment or other patient management decisions. A negative result may occur with  improper specimen collection/handling, submission of specimen other than nasopharyngeal swab, presence of viral mutation(s) within the areas targeted by this assay, and inadequate number of viral copies(<138 copies/mL). A negative result must be combined with clinical observations, patient history, and epidemiological information. The expected result is Negative.  Fact Sheet for Patients:  EntrepreneurPulse.com.au  Fact Sheet for Healthcare Providers:  IncredibleEmployment.be  This test is no t yet approved or cleared by the [place] FDA and  has been authorized for detection and/or diagnosis of SARS-CoV-2 by FDA under an Emergency Use Authorization (EUA). This EUA will remain  in effect (meaning this test can be used) for the duration of the COVID-19 declaration under Section 564(b)(1) of the Act, 21 U.S.C.section 360bbb-3(b)(1), unless the authorization is terminated  or revoked sooner.       Influenza A by PCR NEGATIVE NEGATIVE Final   Influenza B by PCR NEGATIVE NEGATIVE Final    Comment: (NOTE) The Xpert Xpress SARS-CoV-2/FLU/RSV plus assay is intended as an aid in the diagnosis of influenza from Nasopharyngeal swab specimens and should not be used as a sole basis for treatment. Nasal washings and aspirates are unacceptable for Xpert Xpress SARS-CoV-2/FLU/RSV testing.  Fact Sheet for Patients: EntrepreneurPulse.com.au  Fact Sheet for Healthcare Providers: IncredibleEmployment.be  This test is not yet approved or cleared by the [place] FDA and has been authorized for detection and/or diagnosis of SARS-CoV-2 by FDA under  an Emergency Use Authorization (EUA). This EUA will remain in effect (meaning this test can be used) for the duration of the COVID-19 declaration under Section 564(b)(1) of the Act, 21 U.S.C. section 360bbb-3(b)(1), unless the authorization is terminated or revoked.  Performed at [facility], [address] [place], [place] [address]   MRSA PCR Screening     Status: None   Collection Time: 03/22/20  6:18 AM   Specimen: Nasopharyngeal  Result Value Ref Range Status   MRSA by PCR NEGATIVE NEGATIVE Final    Comment:        The GeneXpert MRSA Assay (FDA approved for NASAL specimens only), is one component of a comprehensive MRSA colonization surveillance program. It is not intended to diagnose MRSA infection nor to guide or monitor treatment for MRSA infections. Performed at [facility], [address] [place], [place] [address]     Medications: Scheduled Meds: . amLODipine  10 mg Oral Daily  . Chlorhexidine Gluconate Cloth  6 each Topical Daily  . cloNIDine  0.1 mg Oral  BID  . heparin  5,000 Units Subcutaneous Q8H  . isosorbide mononitrate  30 mg Oral Daily  . labetalol  200 mg Oral BID  . pantoprazole (PROTONIX) IV  40 mg Intravenous QHS  . potassium chloride  40 mEq Oral Once   Continuous Infusions: . magnesium sulfate bolus IVPB    . nitroPRUSSide 1 mcg/kg/min (03/23/20 1209)   PRN Meds:.docusate sodium, labetalol, oxyCODONE, polyethylene glycol  Allergies  Allergen Reactions  . Hydralazine Hcl Other (See Comments)    Flushing and tachycardia    Urinalysis: Recent Labs    03/23/20 2344  COLORURINE STRAW*  LABSPEC 1.009  PHURINE 6.0  GLUCOSEU 50*  HGBUR NEGATIVE  BILIRUBINUR NEGATIVE  KETONESUR NEGATIVE  PROTEINUR NEGATIVE  NITRITE NEGATIVE  LEUKOCYTESUR NEGATIVE      Imaging: No results found.    Assessment/Plan:  Trevor Meyer is a 55 y.o. 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  Negative for protein and blood. Imaging: CT angiogram with IV contrast.  March 13.  No baseline creatinine is available.  IV contrast likely contributed to acute kidney injury. Neg for smoking and alcohol. No PCP. No known family h/o Kidney disease  Plan: Agree with strict BP control We briefly discussed natural course of PCKD We also discussed that he will need regular medical follow ups Will follow  Case d/w wife also.  Harmeet Candiss Norse 03/24/20

## 2020-03-25 ENCOUNTER — Inpatient Hospital Stay: Payer: Self-pay

## 2020-03-25 LAB — BASIC METABOLIC PANEL
Anion gap: 7 (ref 5–15)
BUN: 28 mg/dL — ABNORMAL HIGH (ref 6–20)
CO2: 24 mmol/L (ref 22–32)
Calcium: 8.6 mg/dL — ABNORMAL LOW (ref 8.9–10.3)
Chloride: 109 mmol/L (ref 98–111)
Creatinine, Ser: 2.4 mg/dL — ABNORMAL HIGH (ref 0.61–1.24)
GFR, Estimated: 31 mL/min — ABNORMAL LOW (ref >60)
Glucose, Bld: 98 mg/dL (ref 70–99)
Potassium: 4.2 mmol/L (ref 3.5–5.1)
Sodium: 140 mmol/L (ref 135–145)

## 2020-03-25 LAB — URINE CULTURE: Culture: 10000 — AB

## 2020-03-25 LAB — CBC
HCT: 33.2 % — ABNORMAL LOW (ref 39.0–52.0)
Hemoglobin: 11.4 g/dL — ABNORMAL LOW (ref 13.0–17.0)
MCH: 30.6 pg (ref 26.0–34.0)
MCHC: 34.3 g/dL (ref 30.0–36.0)
MCV: 89.2 fL (ref 80.0–100.0)
Platelets: 175 10*3/uL (ref 150–400)
RBC: 3.72 MIL/uL — ABNORMAL LOW (ref 4.22–5.81)
RDW: 12.7 % (ref 11.5–15.5)
WBC: 6.1 10*3/uL (ref 4.0–10.5)
nRBC: 0 % (ref 0.0–0.2)

## 2020-03-25 LAB — MAGNESIUM: Magnesium: 2.1 mg/dL (ref 1.7–2.4)

## 2020-03-25 LAB — PHOSPHORUS: Phosphorus: 2.9 mg/dL (ref 2.5–4.6)

## 2020-03-25 MED ORDER — CLONIDINE HCL 0.1 MG PO TABS
0.2000 mg | ORAL_TABLET | Freq: Two times a day (BID) | ORAL | Status: DC
  Administered 2020-03-25 – 2020-03-26 (×2): 0.2 mg via ORAL
  Filled 2020-03-25 (×2): qty 2

## 2020-03-25 MED ORDER — DM-GUAIFENESIN ER 30-600 MG PO TB12: 1.0000 | ORAL_TABLET | Freq: Two times a day (BID) | ORAL | Status: DC | PRN

## 2020-03-25 MED ORDER — CLONIDINE HCL 0.1 MG PO TABS
0.1000 mg | ORAL_TABLET | Freq: Once | ORAL | Status: AC
  Administered 2020-03-25: 0.1 mg via ORAL
  Filled 2020-03-25: qty 1

## 2020-03-25 MED ORDER — MAGNESIUM SULFATE 2 GM/50ML IV SOLN
2.0000 g | Freq: Once | INTRAVENOUS | Status: AC
  Administered 2020-03-25: 2 g via INTRAVENOUS
  Filled 2020-03-25: qty 50

## 2020-03-25 MED ORDER — IRBESARTAN 150 MG PO TABS
75.0000 mg | ORAL_TABLET | Freq: Every day | ORAL | Status: DC
  Administered 2020-03-25 – 2020-03-26 (×2): 75 mg via ORAL
  Filled 2020-03-25 (×2): qty 1

## 2020-03-25 MED ORDER — SODIUM CHLORIDE 0.9 % IV SOLN
1.0000 g | INTRAVENOUS | Status: DC
  Administered 2020-03-25: 1 g via INTRAVENOUS
  Filled 2020-03-25: qty 1

## 2020-03-25 NOTE — Progress Notes (Signed)
NAME:  Trevor Meyer, MRN:  [id number], DOB:  June 29, 1965, LOS: 3 ADMISSION DATE:  03/21/2020, CONSULTATION DATE: 03/22/2020 REFERRING MD: Dr. Leonides Schanz, CHIEF COMPLAINT: Back pain  Brief History:  55 year old male with an intramural aortic hematoma, admitted to ICU on a labetalol drip for blood pressure control.  History of Present Illness:  55 year old male with no significant past medical history presenting to the ED from home complaining of chest, back and abdominal pain that started earlier on 03/21/2020.  The patient reports this sharp/severe pain that started in his epigastric area early this morning radiating up his neck making his throat feels sore when swallowing.  He describes this pain as changing over the course of the morning settling more in the upper abdomen radiating to his back at times.  He stated the pain was worse when lying flat making it difficult to rest.  He took 2 doses of Tylenol at home without relief.  He denied any kind of injury or fall.  He denies taking any medication regularly at home or being followed regularly for any chronic medical issues.  No history of aortic pathology for himself or his family, and he states he has not experienced pain like this before. ED course: Chest x-ray obtained in triage showed an ectatic dilated aortic arch, and he was sent by Dr. Leonides Schanz in the ED for a stat CTa to rule out dissection, aneurysm.  CT demonstrated an intramural hematoma extending from the left subclavian artery to the diaphragm without dissection, aneurysm, rupture or ulcer.  Dr. Leonides Schanz discussed with Dr. Bridgett Larsson the on-call vascular surgeon who stated that this would be managed medically similar to a type B dissection with strict blood pressure control.  The patient was given a labetalol push which he responded to well and then started on an esmolol drip to maintain BP less than 120/80.  Dr. Bridgett Larsson did not feel the patient required emergent surgical intervention but may require transfer if at  any point the patient needed an endograft.  Dr. Bridgett Larsson stated he would see the patient as a consulting physician in the morning. Patient was not well controlled on esmolol drip and was transitioned to labetalol with better success. Initial vitals: Afebrile at 98.2, RR 18, NSR at 75, hypertensive at 165/96 with an SPO2 of 96% on room air. Significant labs: AKI with BUN 54/creatinine 2.8, hemoglobin 10.9. PCCM was consulted for admission.  Past Medical History:  No significant history  Significant Hospital Events:  03/22/20-admit to ICU on labetalol drip for BP control 3/15: Remains Hypertensive, Cardene gtt started and Nitroglycerin gtt d/c 3/16: Cardene gtt weaned off this morning; to remain in Stepdown today to observe BP trend on PO meds  Consults:  Vascular Surgery Nephrology  Procedures:  N/A  Significant Diagnostic Tests:  03/21/2020 CT angio chest abdomen pelvis >> acute aortic syndrome-intramural hematoma extending from the left subclavian artery origin to the diaphragmatic hiatus.  Polycystic kidney disease with numerous large cysts.  Aortic atherosclerosis.  Micro Data:  03/21/2020 COVID-19 >> negative 03/21/2020 influenza A/B >> negative 03/22/2020 MRSA PCR >> negative 03/23/2020 Urine>>  Antimicrobials:  N/A  Interim History / Subjective:  -No acute events reported overnight -Afebrile, Hemodynamically stable -Nicardipine has been weaned off since approximately 06:00 this morning -Urine output 1.4 L yesterday, Creatinine slightly improved today (2.40 today) -Pt reports "feels like there is fluid in my lower chest" and "feel like I have some phlegm stuck in my throat" ~ Chest X-ray and Mucinex ordered -Denies chest  pain, back pain, SOB, cough, wheezing, abdominal pain, N/V/D, fever, chills   Objective   Blood pressure 136/80, pulse 88, temperature (!) 97.5 F (36.4 C), temperature source Oral, resp. rate (!) 22, height '5\' 6"'$  (1.676 m), weight 67.2 kg, SpO2 96 %.         Intake/Output Summary (Last 24 hours) at 03/25/2020 0906 Last data filed at 03/25/2020 0640 Gross per 24 hour  Intake 1444.94 ml  Output 1175 ml  Net 269.94 ml   Filed Weights   03/21/20 2138 03/22/20 0418 03/25/20 0500  Weight: 67.1 kg 71 kg 67.2 kg    Examination: General: Adult male, sitting up in bed, on room air, no acute distress HEENT: Atraumatic, normocephalic, neck supple, no JVD, mucous membranes moist Neuro: Awake, alert and oriented x4, follows commands, no focal deficits, speech clear, pupils PERRLA CV: Regular rate and rhythm (normal sinus rhythm on telemetry), S1-S2, no murmurs, rubs, gallops, 2+ distal pulses Pulm: Breath sounds clear bilaterally, diminished in the bases bilaterally, no wheezing, even, nonlabored GI: Soft, nontender, nondistended, no guarding rebound tenderness, bowel sounds positive x4 Skin: Warm and dry.  No obvious rashes, lesions, ulcerations Extremities: Warm and dry.  2+ radial and DP pulses, no deformities, no edema  Resolved Hospital Problem list     Assessment & Plan:   Intramural aortic hematoma Per Vascular surgery this would be medically managed similar to a type B with strict blood pressure control. -Vascular Surgery following, appreciate input -Continuous Cardiac monitoring -Strict BP control: Per discussion with Dr. Lucky Cowboy, goal SBP 120-140 -Nicardipine if needed ~ currently weaned off -Continue PO Norvasc, Clonidine, Labetalol, Imdur -Oxycodone q4h prn for pain control   Polycystic kidney disease- New finding for patient on CT angio, demonstrating right and left kidneys enlarged with numerous large cysts. Acute Kidney Injury  Cr on admission: 2.8 -Monitor I&O's / urinary output -Follow BMP -Ensure adequate renal perfusion -Avoid nephrotoxic agents as able -Replace electrolytes as indicated -Nephrology following, appreciate input of Dr. Deatra Ina practice (evaluated daily)  Diet: Heart  healthy Pain/Anxiety/Delirium protocol (if indicated): Oxycodone as needed VAP protocol (if indicated): N/A DVT prophylaxis: Heparin SQ GI prophylaxis: Protonix IV Glucose control: Monitor every 4 Mobility: As tolerated Disposition: Stepdown  Goals of Care:  Last date of multidisciplinary goals of care discussion: 03/25/2020 Family and staff present: APP, patient and wife, care RN Summary of discussion: Plan of care discussed including blood pressure control, will keep in Stepdown today to see BP trend with PO meds now that Nicardipine weaned off this morning Follow up goals of care discussion due: 03/26/2020 Code Status: Full  Labs   CBC: Recent Labs  Lab 03/21/20 2147 03/22/20 0501 03/23/20 0531 03/24/20 0455 03/25/20 0509  WBC 8.8 7.9 6.6 6.2 6.1  NEUTROABS 6.7  --  4.8  --   --   HGB 10.9* 9.6* 9.8* 10.4* 11.4*  HCT 33.5* 29.5* 29.3* 30.7* 33.2*  MCV 91.8 92.8 90.2 89.8 89.2  PLT 165 135* 140* 147* 0000000    Basic Metabolic Panel: Recent Labs  Lab 03/21/20 2147 03/22/20 0501 03/23/20 0531 03/24/20 0455 03/25/20 0509 03/25/20 0510  NA 134* 137 141 137 140  --   K 3.6 4.0 3.7 3.8 4.2  --   CL 103 109 111 109 109  --   CO2 '24 22 23 23 24  '$ --   GLUCOSE 111* 130* 117* 125* 98  --   BUN 54* 45* 36* 32* 28*  --  CREATININE 2.80* 2.75* 2.74* 2.53* 2.40*  --   CALCIUM 8.1* 7.6* 8.0* 8.2* 8.6*  --   MG  --  2.0 2.0 1.9  --  2.1  PHOS  --  3.2 2.7 2.5  --  2.9   GFR: Estimated Creatinine Clearance: 31.4 mL/min (A) (by C-G formula based on SCr of 2.4 mg/dL (H)). Recent Labs  Lab 03/22/20 0501 03/23/20 0531 03/24/20 0455 03/25/20 0509  WBC 7.9 6.6 6.2 6.1    Liver Function Tests: Recent Labs  Lab 03/21/20 2147 03/23/20 0531  AST 23 15  ALT 17 13  ALKPHOS 40 32*  BILITOT 0.7 1.0  PROT 6.9 6.0*  ALBUMIN 3.9 3.1*   Recent Labs  Lab 03/21/20 2332  LIPASE 60*   No results for input(s): AMMONIA in the last 168 hours.  ABG No results found for: PHART,  PCO2ART, PO2ART, HCO3, TCO2, ACIDBASEDEF, O2SAT   Coagulation Profile: Recent Labs  Lab 03/22/20 0101  INR 1.1    Cardiac Enzymes: No results for input(s): CKTOTAL, CKMB, CKMBINDEX, TROPONINI in the last 168 hours.  HbA1C: No results found for: HGBA1C  CBG: Recent Labs  Lab 03/22/20 0316 03/22/20 0754 03/22/20 1115 03/22/20 1529 03/22/20 1922  GLUCAP 99 138* 143* 115* 121*    Review of Systems: Positives in bold: Pt denies all complaints  Gen: Denies fever, chills, weight change, fatigue, night sweats HEENT: Denies blurred vision, double vision, hearing loss, tinnitus, sinus congestion, rhinorrhea, sore throat, neck stiffness, dysphagia PULM: Denies shortness of breath, cough, sputum production, hemoptysis, wheezing CV: Denies chest pain, edema, orthopnea, paroxysmal nocturnal dyspnea, palpitations, back pain GI: Denies abdominal pain, nausea, vomiting, diarrhea, hematochezia, melena, constipation, change in bowel habits GU: Denies dysuria, hematuria, polyuria, oliguria, urethral discharge Endocrine: Denies hot or cold intolerance, polyuria, polyphagia or appetite change Derm: Denies rash, dry skin, scaling or peeling skin change Heme: Denies easy bruising, bleeding, bleeding gums Neuro: Denies headache, numbness, weakness, slurred speech, loss of memory or consciousness Past Medical History:  He,  has no past medical history on file.   Surgical History:  History reviewed. No pertinent surgical history.   Social History:   reports that he has never smoked. He has never used smokeless tobacco. He reports that he does not drink alcohol.   Family History:  His family history is not on file.   Allergies Allergies  Allergen Reactions  . Hydralazine Hcl Other (See Comments)    Flushing and tachycardia     Home Medications  Prior to Admission medications   Not on File     Critical care time: 32 minutes     Darel Hong, [facility] Pager: [phone]

## 2020-03-25 NOTE — Progress Notes (Signed)
Notified by nursing that Urine Culture growing Group B Strep (S. Agalactiae).  Will initiate Ceftriaxone 2g IV q24 hours.    Darel Hong, AGACNP-BC [facility] Pager: [phone]

## 2020-03-25 NOTE — Progress Notes (Signed)
PHARMACY CONSULT NOTE - FOLLOW UP  Pharmacy Consult for Electrolyte Monitoring and Replacement   Recent Labs: Potassium (mmol/L)  Date Value  03/25/2020 4.2   Magnesium (mg/dL)  Date Value  03/25/2020 2.1   Calcium (mg/dL)  Date Value  03/25/2020 8.6 (L)   Albumin (g/dL)  Date Value  03/23/2020 3.1 (L)   Phosphorus (mg/dL)  Date Value  03/25/2020 2.9   Sodium (mmol/L)  Date Value  03/25/2020 140   Corrected Ca: 8.92  Assessment: 55 yo M presented with chest, back and abdominal pain. 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 Pt with no pertinent PMH. Concern for aortitis or COVID-related vasculitis.  Goal of Therapy:  K 4.0-5.1 Mg 2.0-2.4 All other electrolytes WNL  Plan:  --Mg 2 g IV x 1 dose ordered by NP --Electrolytes WNL, no replacement needed at this time --Monitor electrolytes with AM labs  Benn Moulder, PharmD Pharmacy Resident  03/25/2020 7:42 AM

## 2020-03-25 NOTE — Progress Notes (Signed)
[place], [place] 03/25/20  Subjective:   Hospital day # 3  Patient is doing fair.  Denies any acute complaints. Off nicardipine drip since this morning.  Blood pressure still above goal.  Current blood pressure 139/83 No shortness of breath Able to eat without nausea or vomiting No leg edema  Objective:  Vital signs in last 24 hours:  Temp:  [97.5 F (36.4 C)-98.5 F (36.9 C)] 97.5 F (36.4 C) (03/16 0300) Pulse Rate:  [82-99] 99 (03/16 0921) Resp:  [15-26] 22 (03/16 0600) BP: (119-172)/(71-116) 140/89 (03/16 0921) SpO2:  [86 %-100 %] 96 % (03/16 0600) Weight:  [67.2 kg] 67.2 kg (03/16 0500)  Weight change:  Filed Weights   03/21/20 2138 03/22/20 0418 03/25/20 0500  Weight: 67.1 kg 71 kg 67.2 kg    Intake/Output:    Intake/Output Summary (Last 24 hours) at 03/25/2020 1006 Last data filed at 03/25/2020 0640 Gross per 24 hour  Intake 1375.03 ml  Output 1175 ml  Net 200.03 ml    Physical Exam: General:  No acute distress, laying in the bed  HEENT  anicteric, moist oral mucous membrane  Pulm/lungs  normal breathing effort, lungs are clear to auscultation  CVS/Heart  regular rhythm, no rub or gallop  Abdomen:   Soft, nontender  Extremities:  No peripheral edema  Neurologic:  Alert, oriented, able to follow commands  Skin:  No acute rashes    Basic Metabolic Panel:  Recent Labs  Lab 03/21/20 2147 03/22/20 0501 03/23/20 0531 03/24/20 0455 03/25/20 0509 03/25/20 0510  NA 134* 137 141 137 140  --   K 3.6 4.0 3.7 3.8 4.2  --   CL 103 109 111 109 109  --   CO2 '24 22 23 23 24  '$ --   GLUCOSE 111* 130* 117* 125* 98  --   BUN 54* 45* 36* 32* 28*  --   CREATININE 2.80* 2.75* 2.74* 2.53* 2.40*  --   CALCIUM 8.1* 7.6* 8.0* 8.2* 8.6*  --   MG  --  2.0 2.0 1.9  --  2.1  PHOS  --  3.2 2.7 2.5  --  2.9     CBC: Recent Labs  Lab 03/21/20 2147 03/22/20 0501 03/23/20 0531 03/24/20 0455 03/25/20 0509  WBC 8.8 7.9 6.6 6.2 6.1   NEUTROABS 6.7  --  4.8  --   --   HGB 10.9* 9.6* 9.8* 10.4* 11.4*  HCT 33.5* 29.5* 29.3* 30.7* 33.2*  MCV 91.8 92.8 90.2 89.8 89.2  PLT 165 135* 140* 147* 175     No results found for: HEPBSAG, HEPBSAB, HEPBIGM    Microbiology:  Recent Results (from the past 240 hour(s))  Resp Panel by RT-PCR (Flu A&B, Covid) Nasopharyngeal Swab     Status: None   Collection Time: 03/21/20 11:32 PM   Specimen: Nasopharyngeal Swab; Nasopharyngeal(NP) swabs in vial transport medium  Result Value Ref Range Status   SARS Coronavirus 2 by RT PCR NEGATIVE NEGATIVE Final    Comment: (NOTE) SARS-CoV-2 target nucleic acids are NOT DETECTED.  The SARS-CoV-2 RNA is generally detectable in upper respiratory specimens during the acute phase of infection. The lowest concentration of SARS-CoV-2 viral copies this assay can detect is 138 copies/mL. A negative result does not preclude SARS-Cov-2 infection and should not be used as the sole basis for treatment or other patient management decisions. A negative result may occur with  improper specimen collection/handling, submission of specimen other than nasopharyngeal swab, presence of viral  mutation(s) within the areas targeted by this assay, and inadequate number of viral copies(<138 copies/mL). A negative result must be combined with clinical observations, patient history, and epidemiological information. The expected result is Negative.  Fact Sheet for Patients:  EntrepreneurPulse.com.au  Fact Sheet for Healthcare Providers:  IncredibleEmployment.be  This test is no t yet approved or cleared by the [place] FDA and  has been authorized for detection and/or diagnosis of SARS-CoV-2 by FDA under an Emergency Use Authorization (EUA). This EUA will remain  in effect (meaning this test can be used) for the duration of the COVID-19 declaration under Section 564(b)(1) of the Act, 21 U.S.C.section 360bbb-3(b)(1), unless  the authorization is terminated  or revoked sooner.       Influenza A by PCR NEGATIVE NEGATIVE Final   Influenza B by PCR NEGATIVE NEGATIVE Final    Comment: (NOTE) The Xpert Xpress SARS-CoV-2/FLU/RSV plus assay is intended as an aid in the diagnosis of influenza from Nasopharyngeal swab specimens and should not be used as a sole basis for treatment. Nasal washings and aspirates are unacceptable for Xpert Xpress SARS-CoV-2/FLU/RSV testing.  Fact Sheet for Patients: EntrepreneurPulse.com.au  Fact Sheet for Healthcare Providers: IncredibleEmployment.be  This test is not yet approved or cleared by the [place] FDA and has been authorized for detection and/or diagnosis of SARS-CoV-2 by FDA under an Emergency Use Authorization (EUA). This EUA will remain in effect (meaning this test can be used) for the duration of the COVID-19 declaration under Section 564(b)(1) of the Act, 21 U.S.C. section 360bbb-3(b)(1), unless the authorization is terminated or revoked.  Performed at [facility], [address] [place], [place] [address]   MRSA PCR Screening     Status: None   Collection Time: 03/22/20  6:18 AM   Specimen: Nasopharyngeal  Result Value Ref Range Status   MRSA by PCR NEGATIVE NEGATIVE Final    Comment:        The GeneXpert MRSA Assay (FDA approved for NASAL specimens only), is one component of a comprehensive MRSA colonization surveillance program. It is not intended to diagnose MRSA infection nor to guide or monitor treatment for MRSA infections. Performed at [facility], [address] [place], [place] [address]     Coagulation Studies: No results for input(s): LABPROT, INR in the last 72 hours.  Urinalysis: Recent Labs    03/23/20 2344  COLORURINE STRAW*  LABSPEC 1.009  PHURINE 6.0  GLUCOSEU 50*  HGBUR NEGATIVE  BILIRUBINUR NEGATIVE  KETONESUR NEGATIVE  PROTEINUR NEGATIVE  NITRITE  NEGATIVE  LEUKOCYTESUR NEGATIVE      Imaging: No results found.   Medications:   . niCARDipine Stopped (03/25/20 [id number])   . amLODipine  10 mg Oral Daily  . Chlorhexidine Gluconate Cloth  6 each Topical Daily  . cloNIDine  0.1 mg Oral BID  . heparin  5,000 Units Subcutaneous Q8H  . isosorbide mononitrate  30 mg Oral Daily  . labetalol  200 mg Oral BID  . pantoprazole  40 mg Oral QHS   dextromethorphan-guaiFENesin, docusate sodium, labetalol, oxyCODONE, polyethylene glycol  Assessment/ Plan:  55 y.o. 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  Negative for protein and blood. Imaging: CT angiogram with IV contrast.  March 13.  No baseline creatinine is available.  IV contrast likely contributed to acute kidney injury. Neg for smoking and  alcohol. No PCP. Patient works at [employer] No known family h/o Kidney disease  03/15 0701 - 03/16 0700 In: 1813.9 [P.O.:1530; I.V.:242.4; IV Piggyback:41.6] Out: 1475 [Urine:1475]   Lab Results  Component Value Date   CREATININE 2.40 (H) 03/25/2020   CREATININE 2.53 (H) 03/24/2020   CREATININE 2.74 (H) 03/23/2020   S Creatinine continues to improve slowly   Plan: Agree with strict BP control We briefly discussed natural course of PCKD We also discussed that he will need regular medical follow ups Start low dose irbesartan Will follow  Case d/w wife and patients brother by phone on 3/15    LOS: [place] 3/16/202210:06 AM  [place], [place]  Note: This note was prepared with Dragon dictation. Any transcription errors are unintentional

## 2020-03-26 ENCOUNTER — Other Ambulatory Visit (INDEPENDENT_AMBULATORY_CARE_PROVIDER_SITE_OTHER): Payer: Self-pay | Admitting: Vascular Surgery

## 2020-03-26 DIAGNOSIS — R079 Chest pain, unspecified: Secondary | ICD-10-CM

## 2020-03-26 DIAGNOSIS — R0602 Shortness of breath: Secondary | ICD-10-CM

## 2020-03-26 LAB — CBC
HCT: 31.4 % — ABNORMAL LOW (ref 39.0–52.0)
Hemoglobin: 10.5 g/dL — ABNORMAL LOW (ref 13.0–17.0)
MCH: 30.3 pg (ref 26.0–34.0)
MCHC: 33.4 g/dL (ref 30.0–36.0)
MCV: 90.5 fL (ref 80.0–100.0)
Platelets: 160 10*3/uL (ref 150–400)
RBC: 3.47 MIL/uL — ABNORMAL LOW (ref 4.22–5.81)
RDW: 12.6 % (ref 11.5–15.5)
WBC: 6.3 10*3/uL (ref 4.0–10.5)
nRBC: 0 % (ref 0.0–0.2)

## 2020-03-26 LAB — BASIC METABOLIC PANEL
Anion gap: 8 (ref 5–15)
BUN: 40 mg/dL — ABNORMAL HIGH (ref 6–20)
CO2: 23 mmol/L (ref 22–32)
Calcium: 8.7 mg/dL — ABNORMAL LOW (ref 8.9–10.3)
Chloride: 106 mmol/L (ref 98–111)
Creatinine, Ser: 2.81 mg/dL — ABNORMAL HIGH (ref 0.61–1.24)
GFR, Estimated: 26 mL/min — ABNORMAL LOW (ref >60)
Glucose, Bld: 100 mg/dL — ABNORMAL HIGH (ref 70–99)
Potassium: 4.7 mmol/L (ref 3.5–5.1)
Sodium: 137 mmol/L (ref 135–145)

## 2020-03-26 LAB — MAGNESIUM: Magnesium: 2.3 mg/dL (ref 1.7–2.4)

## 2020-03-26 LAB — PHOSPHORUS: Phosphorus: 4.5 mg/dL (ref 2.5–4.6)

## 2020-03-26 MED ORDER — LABETALOL HCL 200 MG PO TABS: 200.0000 mg | ORAL_TABLET | Freq: Two times a day (BID) | ORAL | 0 refills | Status: AC

## 2020-03-26 MED ORDER — IRBESARTAN 150 MG PO TABS: 75.0000 mg | ORAL_TABLET | Freq: Every evening | ORAL | Status: DC

## 2020-03-26 MED ORDER — DIPHENHYDRAMINE HCL 25 MG PO CAPS
25.0000 mg | ORAL_CAPSULE | Freq: Every evening | ORAL | Status: DC | PRN
  Filled 2020-03-26: qty 1

## 2020-03-26 MED ORDER — ISOSORBIDE MONONITRATE ER 30 MG PO TB24
30.0000 mg | ORAL_TABLET | Freq: Every day | ORAL | 0 refills | Status: AC
Start: 2020-03-27 — End: 2027-07-21

## 2020-03-26 MED ORDER — CLONIDINE HCL 0.1 MG PO TABS: 0.1000 mg | ORAL_TABLET | Freq: Two times a day (BID) | ORAL | Status: DC

## 2020-03-26 MED ORDER — IRBESARTAN 75 MG PO TABS: 75.0000 mg | ORAL_TABLET | Freq: Every evening | ORAL | 0 refills | Status: DC

## 2020-03-26 MED ORDER — AMLODIPINE BESYLATE 10 MG PO TABS: 10.0000 mg | ORAL_TABLET | Freq: Every day | ORAL | 0 refills | Status: AC

## 2020-03-26 MED ORDER — CLONIDINE HCL 0.1 MG PO TABS: 0.1000 mg | ORAL_TABLET | Freq: Two times a day (BID) | ORAL | 11 refills | Status: AC

## 2020-03-26 NOTE — Discharge Instructions (Signed)

## 2020-03-26 NOTE — Progress Notes (Signed)
[place], [place] 03/26/20  Subjective:   Hospital day # 4  Patient is doing fair.  Denies any acute complaints. No shortness of breath Able to eat without nausea or vomiting No leg edema BP now low normal Reports difficulty sleeping at night  Objective:  Vital signs in last 24 hours:  Temp:  [97.9 F (36.6 C)-99.3 F (37.4 C)] 99.3 F (37.4 C) (03/17 0800) Pulse Rate:  [69-85] 73 (03/17 1100) Resp:  [14-28] 18 (03/17 1100) BP: (106-156)/(66-104) 109/67 (03/17 1100) SpO2:  [96 %-99 %] 97 % (03/17 1100) Weight:  [73.8 kg] 73.8 kg (03/17 0500)  Weight change: 6.6 kg Filed Weights   03/22/20 0418 03/25/20 0500 03/26/20 0500  Weight: 71 kg 67.2 kg 73.8 kg    Intake/Output:    Intake/Output Summary (Last 24 hours) at 03/26/2020 1137 Last data filed at 03/26/2020 0800 Gross per 24 hour  Intake 430.13 ml  Output 2225 ml  Net -1794.87 ml    Physical Exam: General:  No acute distress, laying in the bed  HEENT  anicteric, moist oral mucous membrane  Pulm/lungs  normal breathing effort, lungs are clear to auscultation  CVS/Heart  regular rhythm, no rub or gallop  Abdomen:   Soft, nontender  Extremities:  No peripheral edema  Neurologic:  Alert, oriented, able to follow commands  Skin:  No acute rashes    Basic Metabolic Panel:  Recent Labs  Lab 03/22/20 0501 03/23/20 0531 03/24/20 0455 03/25/20 0509 03/25/20 0510 03/26/20 0438  NA 137 141 137 140  --  137  K 4.0 3.7 3.8 4.2  --  4.7  CL 109 111 109 109  --  106  CO2 '22 23 23 24  '$ --  23  GLUCOSE 130* 117* 125* 98  --  100*  BUN 45* 36* 32* 28*  --  40*  CREATININE 2.75* 2.74* 2.53* 2.40*  --  2.81*  CALCIUM 7.6* 8.0* 8.2* 8.6*  --  8.7*  MG 2.0 2.0 1.9  --  2.1 2.3  PHOS 3.2 2.7 2.5  --  2.9 4.5     CBC: Recent Labs  Lab 03/21/20 2147 03/22/20 0501 03/23/20 0531 03/24/20 0455 03/25/20 0509 03/26/20 0438  WBC 8.8 7.9 6.6 6.2 6.1 6.3  NEUTROABS 6.7  --  4.8  --   --    --   HGB 10.9* 9.6* 9.8* 10.4* 11.4* 10.5*  HCT 33.5* 29.5* 29.3* 30.7* 33.2* 31.4*  MCV 91.8 92.8 90.2 89.8 89.2 90.5  PLT 165 135* 140* 147* 175 160     No results found for: HEPBSAG, HEPBSAB, HEPBIGM    Microbiology:  Recent Results (from the past 240 hour(s))  Resp Panel by RT-PCR (Flu A&B, Covid) Nasopharyngeal Swab     Status: None   Collection Time: 03/21/20 11:32 PM   Specimen: Nasopharyngeal Swab; Nasopharyngeal(NP) swabs in vial transport medium  Result Value Ref Range Status   SARS Coronavirus 2 by RT PCR NEGATIVE NEGATIVE Final    Comment: (NOTE) SARS-CoV-2 target nucleic acids are NOT DETECTED.  The SARS-CoV-2 RNA is generally detectable in upper respiratory specimens during the acute phase of infection. The lowest concentration of SARS-CoV-2 viral copies this assay can detect is 138 copies/mL. A negative result does not preclude SARS-Cov-2 infection and should not be used as the sole basis for treatment or other patient management decisions. A negative result may occur with  improper specimen collection/handling, submission of specimen other than nasopharyngeal swab, presence of viral mutation(s)  within the areas targeted by this assay, and inadequate number of viral copies(<138 copies/mL). A negative result must be combined with clinical observations, patient history, and epidemiological information. The expected result is Negative.  Fact Sheet for Patients:  EntrepreneurPulse.com.au  Fact Sheet for Healthcare Providers:  IncredibleEmployment.be  This test is no t yet approved or cleared by the [place] FDA and  has been authorized for detection and/or diagnosis of SARS-CoV-2 by FDA under an Emergency Use Authorization (EUA). This EUA will remain  in effect (meaning this test can be used) for the duration of the COVID-19 declaration under Section 564(b)(1) of the Act, 21 U.S.C.section 360bbb-3(b)(1), unless the  authorization is terminated  or revoked sooner.       Influenza A by PCR NEGATIVE NEGATIVE Final   Influenza B by PCR NEGATIVE NEGATIVE Final    Comment: (NOTE) The Xpert Xpress SARS-CoV-2/FLU/RSV plus assay is intended as an aid in the diagnosis of influenza from Nasopharyngeal swab specimens and should not be used as a sole basis for treatment. Nasal washings and aspirates are unacceptable for Xpert Xpress SARS-CoV-2/FLU/RSV testing.  Fact Sheet for Patients: EntrepreneurPulse.com.au  Fact Sheet for Healthcare Providers: IncredibleEmployment.be  This test is not yet approved or cleared by the [place] FDA and has been authorized for detection and/or diagnosis of SARS-CoV-2 by FDA under an Emergency Use Authorization (EUA). This EUA will remain in effect (meaning this test can be used) for the duration of the COVID-19 declaration under Section 564(b)(1) of the Act, 21 U.S.C. section 360bbb-3(b)(1), unless the authorization is terminated or revoked.  Performed at [facility], [address] [place], [place] [address]   MRSA PCR Screening     Status: None   Collection Time: 03/22/20  6:18 AM   Specimen: Nasopharyngeal  Result Value Ref Range Status   MRSA by PCR NEGATIVE NEGATIVE Final    Comment:        The GeneXpert MRSA Assay (FDA approved for NASAL specimens only), is one component of a comprehensive MRSA colonization surveillance program. It is not intended to diagnose MRSA infection nor to guide or monitor treatment for MRSA infections. Performed at [facility], [address] [place], [place] [address]   Urine Culture     Status: Abnormal   Collection Time: 03/23/20 11:44 PM   Specimen: Urine, Clean Catch  Result Value Ref Range Status   Specimen Description   Final    URINE, CLEAN CATCH Performed at [facility], [address], [place], [place] [address]    Special Requests    Final    NONE Performed at [facility], [place], [place] [address]    Culture (A)  Final    10,000 COLONIES/mL GROUP B STREP(S.AGALACTIAE)ISOLATED TESTING AGAINST S. AGALACTIAE NOT ROUTINELY PERFORMED DUE TO PREDICTABILITY OF AMP/PEN/VAN SUSCEPTIBILITY. Performed at [facility], [place] [address] [place], [place] [address]    Report Status 03/25/2020 FINAL  Final    Coagulation Studies: No results for input(s): LABPROT, INR in the last 72 hours.  Urinalysis: Recent Labs    03/23/20 2344  COLORURINE STRAW*  LABSPEC 1.009  PHURINE 6.0  GLUCOSEU 50*  HGBUR NEGATIVE  BILIRUBINUR NEGATIVE  KETONESUR NEGATIVE  PROTEINUR NEGATIVE  NITRITE NEGATIVE  LEUKOCYTESUR NEGATIVE      Imaging: DG Chest Port 1 View  Result Date: 03/25/2020 CLINICAL DATA:  Short of breath EXAM: PORTABLE CHEST 1 VIEW COMPARISON:  03/21/2020 FINDINGS: Heart size upper normal.  Vascularity normal. Small left pleural effusion slightly progressive. Left lower lobe airspace disease unchanged. Right lung clear. IMPRESSION: Left lower lobe atelectasis/infiltrate unchanged. Improved aeration right lung. Small left pleural effusion slightly increased. Electronically Signed   By: Franchot Gallo M.D.   On: 03/25/2020 11:06     Medications:    . amLODipine  10 mg Oral Daily  . Chlorhexidine Gluconate Cloth  6 each Topical Daily  . cloNIDine  0.2 mg Oral BID  . heparin  5,000 Units Subcutaneous Q8H  . irbesartan  75 mg Oral Daily  . isosorbide mononitrate  30 mg Oral Daily  . labetalol  200 mg Oral BID  . pantoprazole  40 mg Oral QHS   dextromethorphan-guaiFENesin, docusate sodium, labetalol, oxyCODONE, polyethylene glycol  Assessment/ Plan:  55 y.o. 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   Negative for protein and blood. Imaging: CT angiogram with IV contrast.  March 13.  No baseline creatinine is available.  IV contrast likely contributed to acute kidney injury. Neg for smoking and alcohol. No PCP. Patient works at [employer] No known family h/o Kidney disease  03/16 0701 - 03/17 0700 In: 827.9 [P.O.:700; IV Piggyback:127.9] Out: 2075 [Urine:2075]   Lab Results  Component Value Date   CREATININE 2.81 (H) 03/26/2020   CREATININE 2.40 (H) 03/25/2020   CREATININE 2.53 (H) 03/24/2020   S Creatinine higher today ? Whether related to low BP vs ARB  Plan: Agree with strict BP control We briefly discussed natural course of PCKD We also discussed that he will need regular medical follow ups Continue irbesartan Lower dose of clonidine to 0.1 mg Goal BP  [id number] systolic Will follow  Case d/w wife and patients brother by phone on 3/15    LOS: [place] 3/17/202211:37 AM  [facility] [place], [place]  Note: This note was prepared with Dragon dictation. Any transcription errors are unintentional

## 2020-03-26 NOTE — Progress Notes (Signed)
PHARMACY CONSULT NOTE - FOLLOW UP  Pharmacy Consult for Electrolyte Monitoring and Replacement   Recent Labs: Potassium (mmol/L)  Date Value  03/26/2020 4.7   Magnesium (mg/dL)  Date Value  03/26/2020 2.3   Calcium (mg/dL)  Date Value  03/26/2020 8.7 (L)   Albumin (g/dL)  Date Value  03/23/2020 3.1 (L)   Phosphorus (mg/dL)  Date Value  03/26/2020 4.5   Sodium (mmol/L)  Date Value  03/26/2020 137   Corrected Ca: 9.42  Assessment: 55 yo M presented with chest, back and abdominal pain. Found to have aortic intramural hematoma and polycystic kidney disease. Pt with no pertinent PMH. Concern for aortitis or COVID-related vasculitis. Pt on irbesartan. Pharmacy consulted to monitor and replace electrolytes.   Goal of Therapy:  K 4.0-5.1 Mg 2.0-2.4 All other electrolytes WNL  Plan:  --Electrolytes WNL, no replacement needed at this time --Monitor electrolytes with AM labs  Benn Moulder, PharmD Pharmacy Resident  03/26/2020 7:19 AM

## 2020-03-27 LAB — C4 COMPLEMENT: Complement C4, Body Fluid: 29 mg/dL (ref 12–38)

## 2020-03-27 LAB — C3 COMPLEMENT: C3 Complement: 136 mg/dL (ref 82–167)

## 2020-03-27 LAB — ANA W/REFLEX IF POSITIVE: Anti Nuclear Antibody (ANA): NEGATIVE

## 2020-03-28 NOTE — Discharge Summary (Signed)
[facility] at [place] NAME: Jaylani Sonsalla    MR#:  [id number]  DATE OF BIRTH:  23-Aug-1965  DATE OF ADMISSION:  03/21/2020   ADMITTING PHYSICIAN: No admitting provider for patient encounter.  DATE OF DISCHARGE: 03/26/2020  2:46 PM  PRIMARY CARE PHYSICIAN: Patient, No Pcp Per   ADMISSION DIAGNOSIS:  Intramural aortic hematoma (HCC) [I71.00] Chest pain [R07.9] DISCHARGE DIAGNOSIS:  Active Problems:   Intramural aortic hematoma (HCC)   Shortness of breath   Chest pain  SECONDARY DIAGNOSIS:  History reviewed. No pertinent past medical history. HOSPITAL COURSE:  55 y m with intramural aortic hematoma initially admitted to ICU on a labetalol drip for blood pressure control  Intramural aortic hematoma Per Vascular surgery this would be medically managed similar to a type B with strict blood pressure control. - BP much better controlled and he was recommended to have outpt vascular surgery f/up  Polycystic kidney disease- New finding for patient on CT angio, demonstrating right and left kidneys enlarged with numerous large cysts. Acute Kidney Injury  Cr on admission: 2.8 No baseline creatinine is available. IV contrast likely contributed to acute kidney injury. Patient feeling fine and has good UOP. He prefers to go home and agreeable to f/up outpt with Nephro and vascular surgery.  He is requested to have close f/up with nephro (will need monitoring of kidney function) and vascular surgery   DISCHARGE CONDITIONS:  stable CONSULTS OBTAINED:  Treatment Team:  Algernon Huxley, MD DRUG ALLERGIES:   Allergies  Allergen Reactions  . Hydralazine Hcl Other (See Comments)    Flushing and tachycardia   DISCHARGE MEDICATIONS:   Allergies as of 03/26/2020      Reactions   Hydralazine Hcl Other (See Comments)   Flushing and tachycardia      Medication List    TAKE these medications   acetaminophen 160 MG/5ML liquid Commonly known as: TYLENOL Take by mouth  every 4 (four) hours as needed for fever.   amLODipine 10 MG tablet Commonly known as: NORVASC Take 1 tablet (10 mg total) by mouth daily.   cloNIDine 0.1 MG tablet Commonly known as: CATAPRES Take 1 tablet (0.1 mg total) by mouth 2 (two) times daily.   Fish Oil 1000 MG Caps Take by mouth.   irbesartan 75 MG tablet Commonly known as: AVAPRO Take 1 tablet (75 mg total) by mouth every evening.   isosorbide mononitrate 30 MG 24 hr tablet Commonly known as: IMDUR Take 1 tablet (30 mg total) by mouth daily.   labetalol 200 MG tablet Commonly known as: NORMODYNE Take 1 tablet (200 mg total) by mouth 2 (two) times daily.   PROSTATE HEALTH PO Take by mouth.   Protein Powd Take by mouth.      DISCHARGE INSTRUCTIONS:   DIET:  Renal diet DISCHARGE CONDITION:  Stable ACTIVITY:  Activity as tolerated OXYGEN:  Home Oxygen: No.  Oxygen Delivery: room air DISCHARGE LOCATION:  home   If you experience worsening of your admission symptoms, develop shortness of breath, life threatening emergency, suicidal or homicidal thoughts you must seek medical attention immediately by calling 911 or calling your MD immediately  if symptoms less severe.  You Must read complete instructions/literature along with all the possible adverse reactions/side effects for all the Medicines you take and that have been prescribed to you. Take any new Medicines after you have completely understood and accpet all the possible adverse reactions/side effects.   Please note  You were cared  for by a hospitalist during your hospital stay. If you have any questions about your discharge medications or the care you received while you were in the hospital after you are discharged, you can call the unit and asked to speak with the hospitalist on call if the hospitalist that took care of you is not available. Once you are discharged, your primary care physician will handle any further medical issues. Please note that NO  REFILLS for any discharge medications will be authorized once you are discharged, as it is imperative that you return to your primary care physician (or establish a relationship with a primary care physician if you do not have one) for your aftercare needs so that they can reassess your need for medications and monitor your lab values.    On the day of Discharge:  VITAL SIGNS:  Blood pressure (!) 100/52, pulse 71, temperature 98.9 F (37.2 C), temperature source Oral, resp. rate 18, height '5\' 6"'$  (1.676 m), weight 73.8 kg, SpO2 98 %. PHYSICAL EXAMINATION:  GENERAL:  55 y.o.-year-old patient lying in the bed with no acute distress.  EYES: Pupils equal, round, reactive to light and accommodation. No scleral icterus. Extraocular muscles intact.  HEENT: Head atraumatic, normocephalic. Oropharynx and nasopharynx clear.  NECK:  Supple, no jugular venous distention. No thyroid enlargement, no tenderness.  LUNGS: Normal breath sounds bilaterally, no wheezing, rales,rhonchi or crepitation. No use of accessory muscles of respiration.  CARDIOVASCULAR: S1, S2 normal. No murmurs, rubs, or gallops.  ABDOMEN: Soft, non-tender, non-distended. Bowel sounds present. No organomegaly or mass.  EXTREMITIES: No pedal edema, cyanosis, or clubbing.  NEUROLOGIC: Cranial nerves II through XII are intact. Muscle strength 5/5 in all extremities. Sensation intact. Gait not checked.  PSYCHIATRIC: The patient is alert and oriented x 3.  SKIN: No obvious rash, lesion, or ulcer.  DATA REVIEW:   CBC Recent Labs  Lab 03/26/20 0438  WBC 6.3  HGB 10.5*  HCT 31.4*  PLT 160    Chemistries  Recent Labs  Lab 03/23/20 0531 03/24/20 0455 03/26/20 0438  NA 141   < > 137  K 3.7   < > 4.7  CL 111   < > 106  CO2 23   < > 23  GLUCOSE 117*   < > 100*  BUN 36*   < > 40*  CREATININE 2.74*   < > 2.81*  CALCIUM 8.0*   < > 8.7*  MG 2.0   < > 2.3  AST 15  --   --   ALT 13  --   --   ALKPHOS 32*  --   --   BILITOT 1.0  --    --    < > = values in this interval not displayed.     Outpatient follow-up  Follow-up Information    Dew, Erskine Squibb, MD. Schedule an appointment as soon as possible for a visit in 1 week(s).   Specialties: Vascular Surgery, Radiology, Interventional Cardiology Contact information: [place] [address] [phone]        Emmaline Kluver., MD. Schedule an appointment as soon as possible for a visit in 2 week(s).   Specialty: Rheumatology Contact information: [place] [address] [phone]        Murlean Iba, MD. Schedule an appointment as soon as possible for a visit in 2 week(s).   Specialty: Nephrology Contact information: [place] [address] [phone]  30 Day Unplanned Readmission Risk Score   Flowsheet Row ED to Hosp-Admission (Discharged) from 03/21/2020 in No Name ICU/CCU  30 Day Unplanned Readmission Risk Score (%) 15.24 Filed at 03/26/2020 1200     This score is the patient's risk of an unplanned readmission within 30 days of being discharged (0 -100%). The score is based on dignosis, age, lab data, medications, orders, and past utilization.   Low:  0-14.9   Medium: 15-21.9   High: 22-29.9   Extreme: 30 and above         Management plans discussed with the patient, family and they are in agreement.  CODE STATUS: Prior   TOTAL TIME TAKING CARE OF THIS PATIENT: 45 minutes.    Max Sane M.D on 03/28/2020 at 9:13 PM  [facility]   CC: Primary care physician; Patient, No Pcp Per   Note: This dictation was prepared with Dragon dictation along with smaller phrase technology. Any transcriptional errors that result from this process are unintentional.

## 2020-04-07 ENCOUNTER — Other Ambulatory Visit (INDEPENDENT_AMBULATORY_CARE_PROVIDER_SITE_OTHER): Payer: Self-pay | Admitting: Nurse Practitioner

## 2020-04-07 ENCOUNTER — Encounter (INDEPENDENT_AMBULATORY_CARE_PROVIDER_SITE_OTHER): Payer: Self-pay | Admitting: Vascular Surgery

## 2020-04-07 DIAGNOSIS — I7100 Dissection of unspecified site of aorta: Secondary | ICD-10-CM

## 2021-02-18 ENCOUNTER — Other Ambulatory Visit
Admission: RE | Admit: 2021-02-18 | Discharge: 2021-02-18 | Disposition: A | Discharge status: Home / Self Care | Payer: Self-pay | Attending: Nephrology | Admitting: Nephrology

## 2021-02-18 DIAGNOSIS — I129 Hypertensive chronic kidney disease with stage 1 through stage 4 chronic kidney disease, or unspecified chronic kidney disease: Secondary | ICD-10-CM | POA: Insufficient documentation

## 2021-02-18 DIAGNOSIS — N184 Chronic kidney disease, stage 4 (severe): Secondary | ICD-10-CM | POA: Insufficient documentation

## 2021-02-18 DIAGNOSIS — Q613 Polycystic kidney, unspecified: Secondary | ICD-10-CM | POA: Insufficient documentation

## 2021-02-18 LAB — URINALYSIS, COMPLETE (UACMP) WITH MICROSCOPIC
Bacteria, UA: NONE SEEN
Bilirubin Urine: NEGATIVE
Glucose, UA: NEGATIVE mg/dL
Hgb urine dipstick: NEGATIVE
Ketones, ur: NEGATIVE mg/dL
Leukocytes,Ua: NEGATIVE
Nitrite: NEGATIVE
Protein, ur: NEGATIVE mg/dL
Specific Gravity, Urine: 1.011 (ref 1.005–1.030)
pH: 6 (ref 5.0–8.0)

## 2021-02-18 LAB — RENAL FUNCTION PANEL
Albumin: 3.8 g/dL (ref 3.5–5.0)
Anion gap: 5 (ref 5–15)
BUN: 58 mg/dL — ABNORMAL HIGH (ref 6–20)
CO2: 25 mmol/L (ref 22–32)
Calcium: 8.8 mg/dL — ABNORMAL LOW (ref 8.9–10.3)
Chloride: 110 mmol/L (ref 98–111)
Creatinine, Ser: 3.85 mg/dL — ABNORMAL HIGH (ref 0.61–1.24)
GFR, Estimated: 18 mL/min — ABNORMAL LOW (ref >60)
Glucose, Bld: 101 mg/dL — ABNORMAL HIGH (ref 70–99)
Phosphorus: 3.3 mg/dL (ref 2.5–4.6)
Potassium: 4.4 mmol/L (ref 3.5–5.1)
Sodium: 140 mmol/L (ref 135–145)

## 2021-02-18 LAB — CBC
HCT: 36.3 % — ABNORMAL LOW (ref 39.0–52.0)
Hemoglobin: 12 g/dL — ABNORMAL LOW (ref 13.0–17.0)
MCH: 30.3 pg (ref 26.0–34.0)
MCHC: 33.1 g/dL (ref 30.0–36.0)
MCV: 91.7 fL (ref 80.0–100.0)
Platelets: 171 10*3/uL (ref 150–400)
RBC: 3.96 MIL/uL — ABNORMAL LOW (ref 4.22–5.81)
RDW: 13.2 % (ref 11.5–15.5)
WBC: 5.8 10*3/uL (ref 4.0–10.5)
nRBC: 0 % (ref 0.0–0.2)

## 2021-02-18 LAB — PROTEIN / CREATININE RATIO, URINE
Creatinine, Urine: 78 mg/dL
Protein Creatinine Ratio: 0.28 mg/mg{Cre} — ABNORMAL HIGH (ref 0.00–0.15)
Total Protein, Urine: 22 mg/dL

## 2022-04-15 IMAGING — CR DG CHEST 2V
1 series · 2 of 2 positions shown · non-contrast
Comparison: None.

CLINICAL DATA: Thoracic back pain upon awakening this morning,
intermittent chest pain

EXAM:
CHEST - 2 VIEW

[Series 1: dg chest 2 view · 0.14mm/px · 2 of 2 slices shown]
[im 1/2]
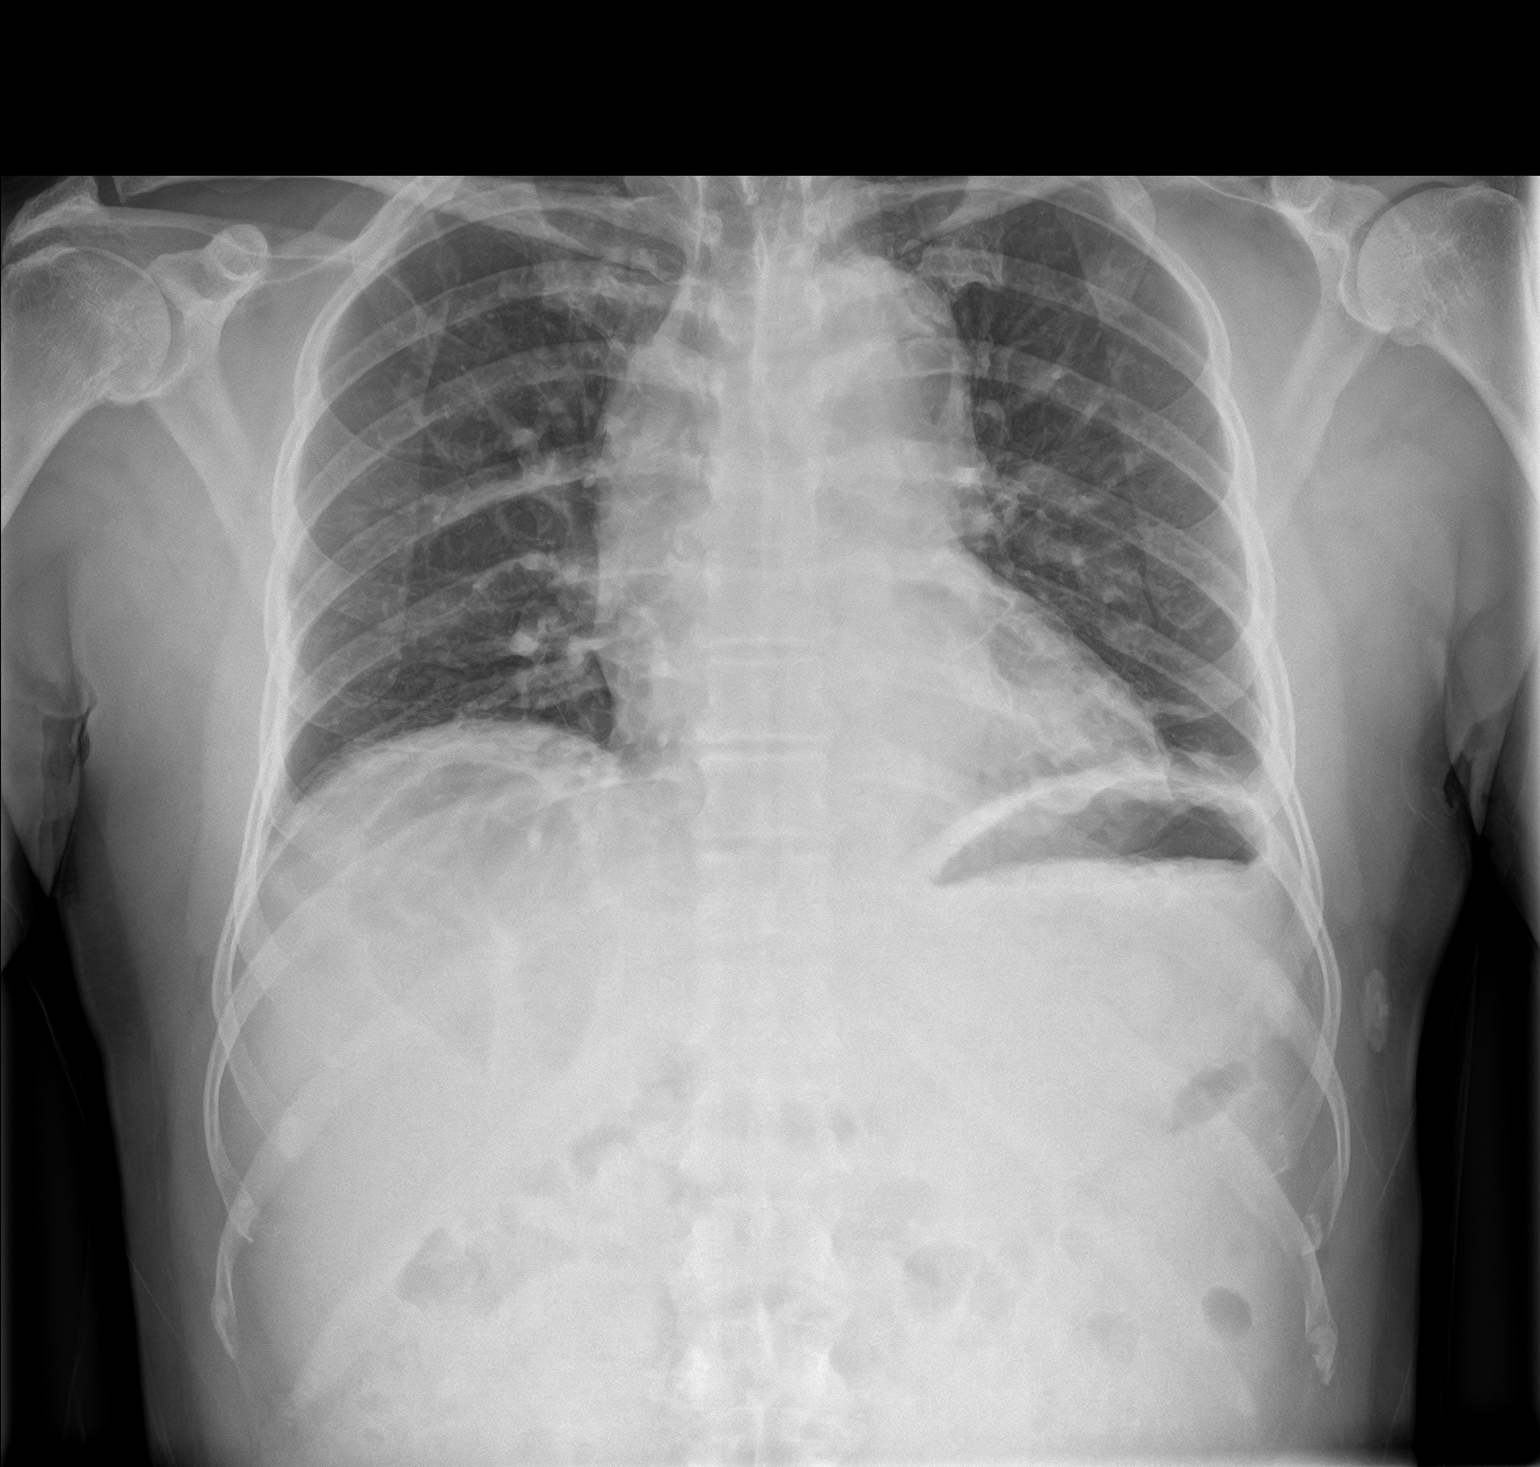
[im 2/2]
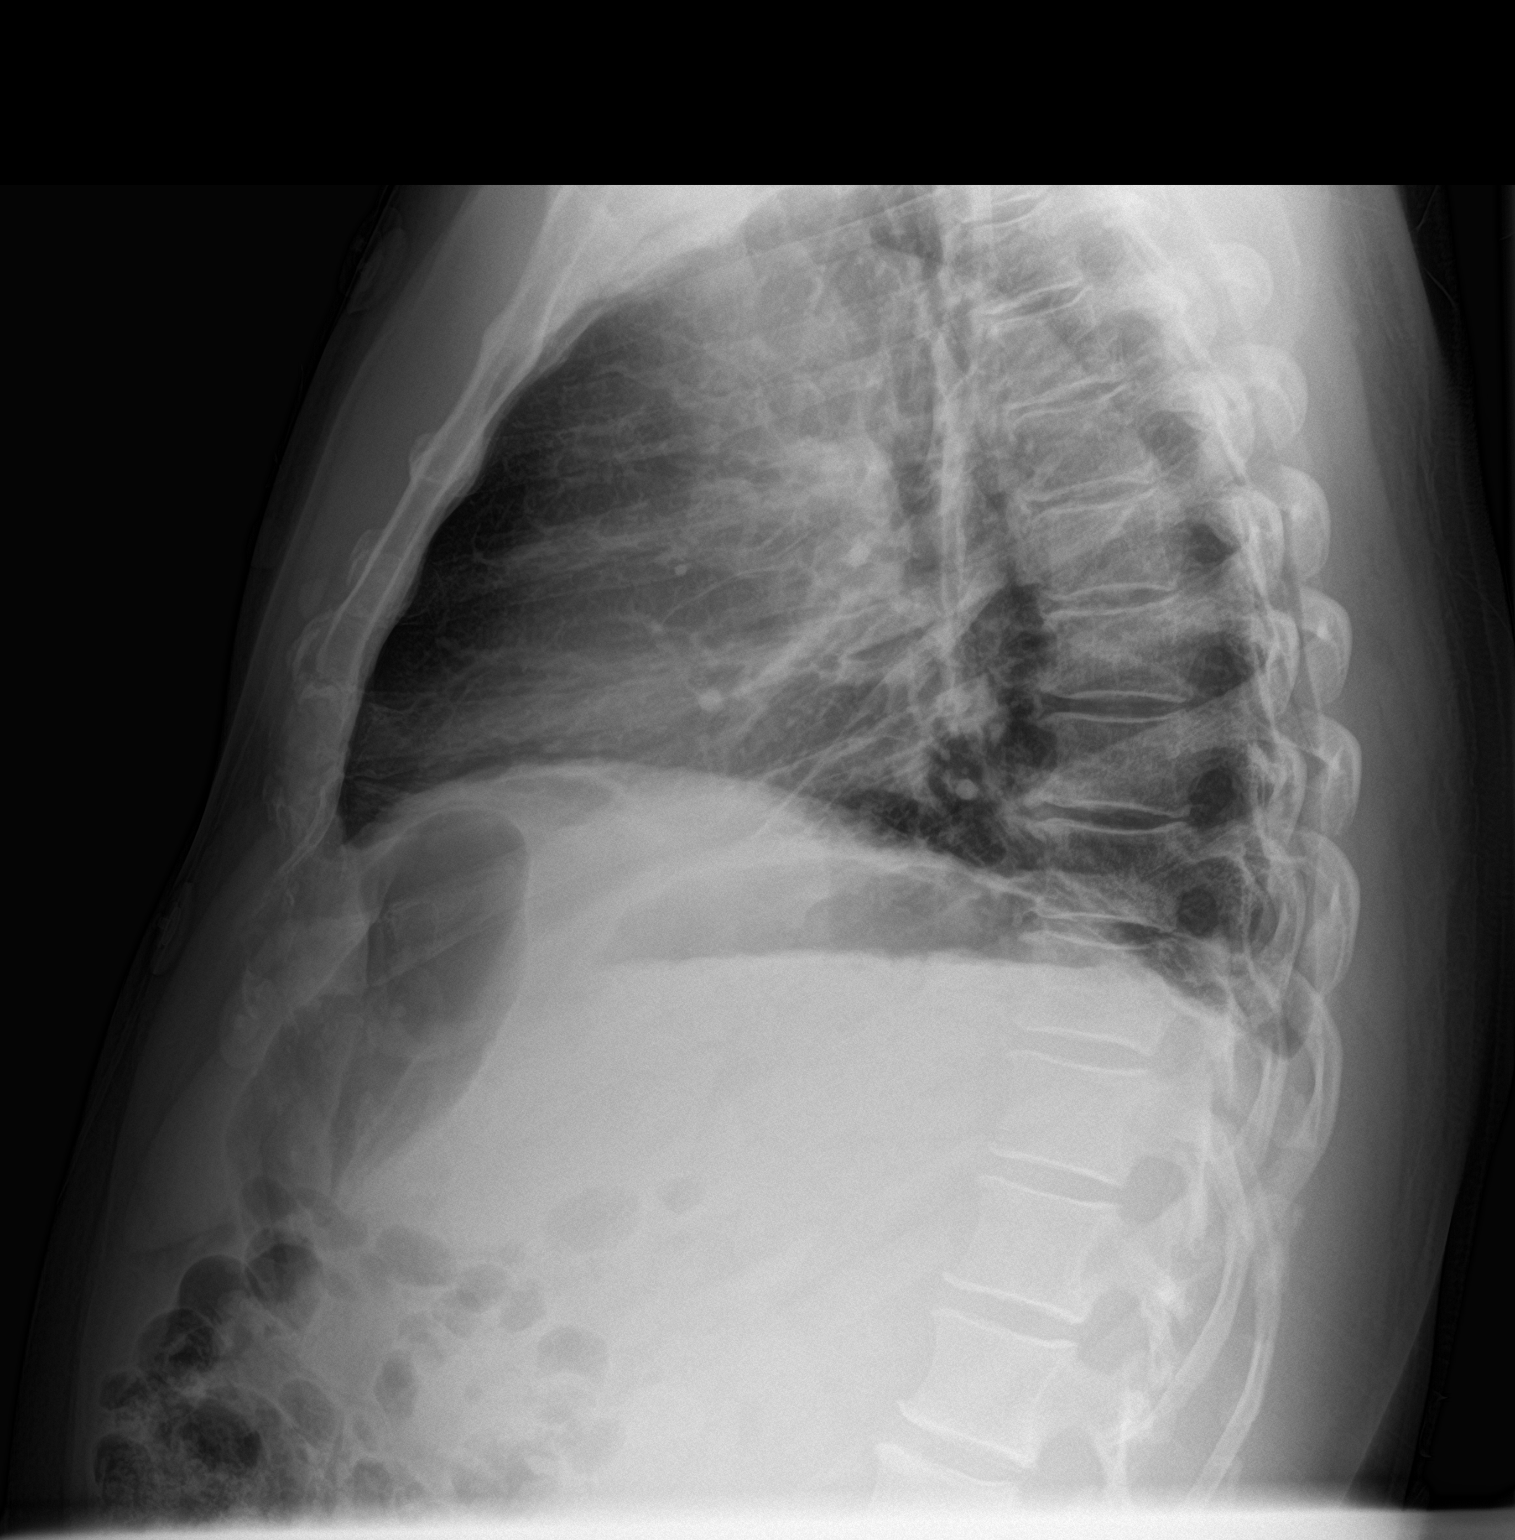

[2 of 2 positions shown; findings below may reference images not displayed]

FINDINGS: Frontal and lateral views of the chest demonstrate an unremarkable
cardiac silhouette. There is ectasia of the thoracic aorta, with
prominence of the aortic knob. I do not have any prior studies for
direct comparison. If aortic pathology is suspected, CT angiography
of the chest is recommended. No airspace disease, effusion, or
pneumothorax. No acute bony abnormalities.
IMPRESSION: 1. Dilated ectatic aortic arch. In light of patient's presenting
symptoms and lack of comparison studies, CT angiography of the chest
may be useful to assess for underlying aortic pathology.
2. No acute airspace disease.

## 2023-06-01 ENCOUNTER — Ambulatory Visit: Payer: Self-pay | Admitting: Physician Assistant

## 2023-06-01 ENCOUNTER — Telehealth: Payer: Self-pay | Admitting: Physician Assistant

## 2023-06-01 ENCOUNTER — Encounter: Payer: Self-pay | Admitting: Physician Assistant

## 2023-06-01 VITALS — BP 150/84 | HR 77 | Temp 97.8°F | Ht 66.0 in | Wt 148.0 lb

## 2023-06-01 DIAGNOSIS — Z23 Encounter for immunization: Secondary | ICD-10-CM | POA: Diagnosis not present

## 2023-06-01 DIAGNOSIS — Z1159 Encounter for screening for other viral diseases: Secondary | ICD-10-CM

## 2023-06-01 DIAGNOSIS — I1A0 Resistant hypertension: Secondary | ICD-10-CM | POA: Insufficient documentation

## 2023-06-01 DIAGNOSIS — N184 Chronic kidney disease, stage 4 (severe): Secondary | ICD-10-CM | POA: Insufficient documentation

## 2023-06-01 DIAGNOSIS — Q612 Polycystic kidney, adult type: Secondary | ICD-10-CM | POA: Insufficient documentation

## 2023-06-01 DIAGNOSIS — Z1211 Encounter for screening for malignant neoplasm of colon: Secondary | ICD-10-CM

## 2023-06-01 DIAGNOSIS — Z125 Encounter for screening for malignant neoplasm of prostate: Secondary | ICD-10-CM

## 2023-06-01 DIAGNOSIS — I10 Essential (primary) hypertension: Secondary | ICD-10-CM | POA: Insufficient documentation

## 2023-06-01 DIAGNOSIS — Z13220 Encounter for screening for lipoid disorders: Secondary | ICD-10-CM

## 2023-06-01 DIAGNOSIS — Z131 Encounter for screening for diabetes mellitus: Secondary | ICD-10-CM

## 2023-06-01 DIAGNOSIS — I700 Atherosclerosis of aorta: Secondary | ICD-10-CM | POA: Insufficient documentation

## 2023-06-01 DIAGNOSIS — I71012 Dissection of descending thoracic aorta: Secondary | ICD-10-CM | POA: Insufficient documentation

## 2023-06-01 DIAGNOSIS — Z0000 Encounter for general adult medical examination without abnormal findings: Secondary | ICD-10-CM

## 2023-06-01 NOTE — Patient Instructions (Addendum)
-  It was a pleasure to see you today! Please review your visit summary for helpful information -Lab results are usually available within 1-2 days and we will call once reviewed -I would encourage you to follow your care via MyChart where you can access lab results, notes, messages, and more -If you feel that we did a nice job today, please complete your after-visit survey and leave us  a Google review! Your CMA today was Kieandra and your provider was Cody Das, PA-C, DMSc -Please return for follow-up in about 2 weeks. Please record your blood pressure twice per day and bring me a log!

## 2023-06-01 NOTE — Progress Notes (Signed)
 Date:  06/01/2023   Name:  Trevor Meyer   DOB:  Dec 31, 1965   MRN:  [id number]   Chief Complaint: Establish Care  HPI Trevor Meyer is a very pleasant 59 y.o. male with history of CKD4, polycystic kidney, resistant HTN, and history of aortic dissection/hematoma in 2022 who presents new to the clinic to establish care. Surprisingly, he has not had any medical follow up since his last nephrology visit with Dr. Zelda Hickman in Feb 2023; this provider has been filling his antihypertensives for the last several years and encouraging him to get PCP.   Patient reports good compliance with medications, has a BP cuff at home but has not been checking recently. Due to getting lightheaded with medications, he usually takes most of them in the late morning with food, then for clonidine  and labetalol  he takes a second dose later in the evening.   He reports good urine output, clear-yellow in color. Denies hematuria or dark urine.  Regarding the aorta,  seen by Rheum 04/07/20 to r/o vasculitis but they suspected intramural descending aortic dissection secondary to uncontrolled HTN and suggested to proceed with vascular and nephrology. Apparently vascular recommended management as a "type B dissection"  He is long overdue for all routine care and has never had an adult physical. Reports no particular concerns today though he is curious to see his kidney function.   Last Physical: Never Last Dental Exam: Mar 2025 Last Eye Exam: 1 ago [facility] Last CRC screen: Never Last PSA: Never Immunizations Due: Tdap, Shingrix   Medication list has been reviewed and updated.  Current Meds  Medication Sig   amLODipine  (NORVASC ) 10 MG tablet Take 1 tablet (10 mg total) by mouth daily.   cloNIDine  (CATAPRES ) 0.1 MG tablet Take 1 tablet (0.1 mg total) by mouth 2 (two) times daily.   irbesartan  (AVAPRO ) 75 MG tablet Take 1 tablet (75 mg total) by mouth every evening.   isosorbide  mononitrate (IMDUR ) 30 MG 24  hr tablet Take 1 tablet (30 mg total) by mouth daily.   labetalol  (NORMODYNE ) 200 MG tablet Take 1 tablet (200 mg total) by mouth 2 (two) times daily.   Misc Natural Products (PROSTATE HEALTH PO) Take by mouth.   Omega-3 Fatty Acids (FISH OIL) 1000 MG CAPS Take by mouth.   [DISCONTINUED] acetaminophen (TYLENOL) 160 MG/5ML liquid Take by mouth every 4 (four) hours as needed for fever.   [DISCONTINUED] Protein POWD Take by mouth.     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  History reviewed. No pertinent surgical history.  Social History   Tobacco Use   Smoking status: Never   Smokeless tobacco: Never  Vaping Use   Vaping status: Never Used  Substance Use Topics   Alcohol use: Never   Drug use: Never    Family History  Problem Relation Age of Onset  Cancer Mother    Stroke Father         06/01/2023   10:31 AM  GAD 7 : Generalized Anxiety Score  Nervous, Anxious, on Edge 0  Control/stop worrying 0  Worry too much - different things 0  Trouble relaxing 0  Restless 0  Easily annoyed or irritable 0  Afraid - awful might happen 0  Total GAD 7 Score 0  Anxiety Difficulty Not difficult at all       06/01/2023   10:31 AM  Depression screen PHQ 2/9  Decreased Interest 0  Down, Depressed, Hopeless 0  PHQ - 2 Score 0    BP Readings from Last 3 Encounters:  06/01/23 (!) 150/84   03/26/20 (!) 100/52    Wt Readings from Last 3 Encounters:  06/01/23 148 lb (67.1 kg)  03/26/20 162 lb 11.2 oz (73.8 kg)    BP (!) 150/84 (BP Location: Left Arm, Cuff Size: Normal)   Pulse 77   Temp 97.8 F (36.6 C)   Ht 5\' 6"  (1.676 m)   Wt 148 lb (67.1 kg)   SpO2 98%   BMI 23.89 kg/m   Physical Exam Vitals and nursing note reviewed.  Constitutional:      Appearance: Normal appearance.  HENT:     Ears:     Comments: EAC clear bilaterally with good view of TM which is without effusion or erythema.     Nose: Nose normal.     Mouth/Throat:     Mouth: Mucous membranes are moist. No oral lesions.     Dentition: Normal dentition.     Pharynx: No posterior oropharyngeal erythema.  Eyes:     Extraocular Movements: Extraocular movements intact.     Conjunctiva/sclera: Conjunctivae normal.     Pupils: Pupils are equal, round, and reactive to light.  Neck:     Thyroid: No thyromegaly.  Cardiovascular:     Rate and Rhythm: Normal rate and regular rhythm.     Heart sounds: No murmur heard.    No friction rub. No gallop.     Comments: Pulses 2+ at radial, PT, DP bilaterally. No carotid bruit. No peripheral edema Pulmonary:     Effort: Pulmonary effort is normal.     Breath sounds: Normal breath sounds.  Abdominal:     General: Bowel sounds are normal.     Palpations: Abdomen is soft. There is no mass.     Tenderness: There is no abdominal tenderness.  Genitourinary:    Prostate: Normal. Not enlarged, not tender and no nodules present.     Rectum: Normal. Guaiac result negative. No mass.     Comments: Genital exam deferred.  Musculoskeletal:     Comments: Full ROM with strength 5/5 bilateral upper and lower extremities  Lymphadenopathy:     Cervical: No cervical adenopathy.  Skin:    General: Skin is warm.     Capillary Refill: Capillary refill takes less than 2 seconds.     Findings: No lesion or rash.  Neurological:     Mental Status: He is alert and oriented to  person, place, and time.     Gait: Gait is intact.  Psychiatric:        Mood and Affect: Mood normal.        Behavior: Behavior normal.     Recent Labs     Component Value Date/Time   NA 140 02/18/2021 1057   K 4.4 02/18/2021 1057   CL 110 02/18/2021 1057   CO2  25 02/18/2021 1057   GLUCOSE 101 (H) 02/18/2021 1057   BUN 58 (H) 02/18/2021 1057   CREATININE 3.85 (H) 02/18/2021 1057   CALCIUM 8.8 (L) 02/18/2021 1057   PROT 6.0 (L) 03/23/2020 0531   ALBUMIN 3.8 02/18/2021 1057   AST 15 03/23/2020 0531   ALT 13 03/23/2020 0531   ALKPHOS 32 (L) 03/23/2020 0531   BILITOT 1.0 03/23/2020 0531   GFRNONAA 18 (L) 02/18/2021 1057    Lab Results  Component Value Date   WBC 5.8 02/18/2021   HGB 12.0 (L) 02/18/2021   HCT 36.3 (L) 02/18/2021   MCV 91.7 02/18/2021   PLT 171 02/18/2021   No results found for: "HGBA1C" No results found for: "CHOL", "HDL", "LDLCALC", "LDLDIRECT", "TRIG", "CHOLHDL" No results found for: "TSH"   Assessment and Plan:  1. Annual physical exam (Primary) No abnormalities on exam, though he does have a complex medical history. Encouraged healthy lifestyle including regular physical activity and consumption of whole fruits and vegetables. Encouraged routine dental and eye exams. Vaccinations updated today.   - CBC with Differential/Platelet - Comprehensive metabolic panel with GFR - TSH - Lipid panel - PSA - Hepatitis C antibody - Microalbumin / creatinine urine ratio - Hemoglobin A1c  2. Autosomal dominant polycystic kidney disease Check labs, continue ongoing management with nephrology.  - CBC with Differential/Platelet - Comprehensive metabolic panel with GFR  3. Resistant hypertension Elevated today, but not terribly so. This is also before he has taken most of his daily medications. Encouraged home bp log for my review.  Short interval f/y 2 weeks. Take all meds as prescribed until then.  - CBC with Differential/Platelet - Comprehensive metabolic  panel with GFR - TSH - Lipid panel  4. Chronic kidney disease, stage 4 (severe) (HCC) Check renal function and micral.  - CBC with Differential/Platelet - Comprehensive metabolic panel with GFR - Microalbumin / creatinine urine ratio - Hemoglobin A1c  5. Dissection of descending thoracic aorta (HCC) Plan for CTA chest/abd/pelv if renal function allows for contrast.   6. Aortic atherosclerosis (HCC) Check fasting lipids, assess appropriateness of statin therapy once labs have returned.   7. Need for hepatitis C screening test - Hepatitis C antibody  8. Screening for colon cancer Discussed colonoscopy vs Cologuard. Through shared decision making, will proceed with colonoscopy for initial CRC screen in this patient long overdue.  - Ambulatory referral to Gastroenterology  9. Screening PSA (prostate specific antigen) Normal prostate exam today, check PSA - PSA  10. Screening for hyperlipidemia Check fasting lipids. - Lipid panel  11. Screening for diabetes mellitus Check A1c due to advanced renal disease - Hemoglobin A1c  12. Encounter for immunization Tdap and Shingrix #1 given today. Will be due in 2 months for Shingrix #2 - Tdap vaccine greater than or equal to 7yo IM - Varicella-zoster vaccine IM    Return in about 2 weeks (around 06/15/2023) for OV f/u chronic conditions.    Cody Das, PA-C, DMSc, Nutritionist [facility] [phone]

## 2023-06-01 NOTE — Telephone Encounter (Signed)
 Called [facility], confirmed that they actually never saw this patient at all for outpatient follow up after hospitalization 2022.

## 2023-06-02 ENCOUNTER — Emergency Department

## 2023-06-02 ENCOUNTER — Inpatient Hospital Stay
Admission: EM | Admit: 2023-06-02 | Discharge: 2023-06-05 | DRG: 683 | Disposition: A | Discharge status: Home / Self Care | Source: Ambulatory Visit | Attending: Student | Admitting: Student

## 2023-06-02 ENCOUNTER — Telehealth: Payer: Self-pay | Admitting: Physician Assistant

## 2023-06-02 ENCOUNTER — Other Ambulatory Visit: Payer: Self-pay

## 2023-06-02 ENCOUNTER — Ambulatory Visit: Payer: Self-pay | Admitting: Physician Assistant

## 2023-06-02 DIAGNOSIS — D649 Anemia, unspecified: Secondary | ICD-10-CM

## 2023-06-02 DIAGNOSIS — Z809 Family history of malignant neoplasm, unspecified: Secondary | ICD-10-CM

## 2023-06-02 DIAGNOSIS — N186 End stage renal disease: Secondary | ICD-10-CM | POA: Diagnosis present

## 2023-06-02 DIAGNOSIS — I10 Essential (primary) hypertension: Secondary | ICD-10-CM | POA: Diagnosis present

## 2023-06-02 DIAGNOSIS — Z888 Allergy status to other drugs, medicaments and biological substances status: Secondary | ICD-10-CM | POA: Diagnosis not present

## 2023-06-02 DIAGNOSIS — E8721 Acute metabolic acidosis: Secondary | ICD-10-CM | POA: Diagnosis present

## 2023-06-02 DIAGNOSIS — Z823 Family history of stroke: Secondary | ICD-10-CM | POA: Diagnosis not present

## 2023-06-02 DIAGNOSIS — D631 Anemia in chronic kidney disease: Secondary | ICD-10-CM | POA: Diagnosis present

## 2023-06-02 DIAGNOSIS — N179 Acute kidney failure, unspecified: Principal | ICD-10-CM | POA: Diagnosis present

## 2023-06-02 DIAGNOSIS — Q612 Polycystic kidney, adult type: Secondary | ICD-10-CM

## 2023-06-02 DIAGNOSIS — I1A0 Resistant hypertension: Secondary | ICD-10-CM | POA: Diagnosis present

## 2023-06-02 DIAGNOSIS — R7303 Prediabetes: Secondary | ICD-10-CM | POA: Insufficient documentation

## 2023-06-02 DIAGNOSIS — E875 Hyperkalemia: Secondary | ICD-10-CM | POA: Diagnosis present

## 2023-06-02 DIAGNOSIS — N2581 Secondary hyperparathyroidism of renal origin: Secondary | ICD-10-CM | POA: Diagnosis present

## 2023-06-02 DIAGNOSIS — Z79899 Other long term (current) drug therapy: Secondary | ICD-10-CM | POA: Diagnosis not present

## 2023-06-02 DIAGNOSIS — N185 Chronic kidney disease, stage 5: Secondary | ICD-10-CM

## 2023-06-02 DIAGNOSIS — E782 Mixed hyperlipidemia: Secondary | ICD-10-CM | POA: Insufficient documentation

## 2023-06-02 LAB — CBC WITH DIFFERENTIAL/PLATELET
Basophils Absolute: 0 10*3/uL (ref 0.0–0.2)
Basos: 1 %
EOS (ABSOLUTE): 0.2 10*3/uL (ref 0.0–0.4)
Eos: 4 %
Hematocrit: 24.6 % — ABNORMAL LOW (ref 37.5–51.0)
Hemoglobin: 7.9 g/dL — ABNORMAL LOW (ref 13.0–17.7)
Immature Grans (Abs): 0 10*3/uL (ref 0.0–0.1)
Immature Granulocytes: 0 %
Lymphocytes Absolute: 1 10*3/uL (ref 0.7–3.1)
Lymphs: 24 %
MCH: 30.2 pg (ref 26.6–33.0)
MCHC: 32.1 g/dL (ref 31.5–35.7)
MCV: 94 fL (ref 79–97)
Monocytes Absolute: 0.4 10*3/uL (ref 0.1–0.9)
Monocytes: 10 %
Neutrophils Absolute: 2.6 10*3/uL (ref 1.4–7.0)
Neutrophils: 61 %
Platelets: 173 10*3/uL (ref 150–450)
RBC: 2.62 x10E6/uL — CL (ref 4.14–5.80)
RDW: 12.3 % (ref 11.6–15.4)
WBC: 4.3 10*3/uL (ref 3.4–10.8)

## 2023-06-02 LAB — COMPREHENSIVE METABOLIC PANEL WITH GFR
ALT: 11 IU/L (ref 0–44)
ALT: 14 U/L (ref 0–44)
AST: 11 IU/L (ref 0–40)
AST: 13 U/L — ABNORMAL LOW (ref 15–41)
Albumin: 4.3 g/dL (ref 3.8–4.9)
Albumin: 3.6 g/dL (ref 3.5–5.0)
Alkaline Phosphatase: 53 IU/L (ref 44–121)
Alkaline Phosphatase: 44 U/L (ref 38–126)
Anion gap: 12 (ref 5–15)
BUN/Creatinine Ratio: 11 (ref 9–20)
BUN: 99 mg/dL (ref 6–24)
BUN: 97 mg/dL — ABNORMAL HIGH (ref 6–20)
Bilirubin Total: <0.2 mg/dL (ref 0.0–1.2)
CO2: 15 mmol/L — ABNORMAL LOW (ref 20–29)
CO2: 17 mmol/L — ABNORMAL LOW (ref 22–32)
Calcium: 8.6 mg/dL — ABNORMAL LOW (ref 8.7–10.2)
Calcium: 8.7 mg/dL — ABNORMAL LOW (ref 8.9–10.3)
Chloride: 113 mmol/L — ABNORMAL HIGH (ref 96–106)
Chloride: 111 mmol/L (ref 98–111)
Creatinine, Ser: 8.68 mg/dL — ABNORMAL HIGH (ref 0.76–1.27)
Creatinine, Ser: 8.39 mg/dL — ABNORMAL HIGH (ref 0.61–1.24)
GFR, Estimated: 7 mL/min — ABNORMAL LOW (ref >60)
Globulin, Total: 2.4 g/dL (ref 1.5–4.5)
Glucose, Bld: 116 mg/dL — ABNORMAL HIGH (ref 70–99)
Glucose: 95 mg/dL (ref 70–99)
Potassium: 6.3 mmol/L — ABNORMAL HIGH (ref 3.5–5.2)
Potassium: 6.1 mmol/L — ABNORMAL HIGH (ref 3.5–5.1)
Sodium: 144 mmol/L (ref 134–144)
Sodium: 140 mmol/L (ref 135–145)
Total Bilirubin: 0.7 mg/dL (ref 0.0–1.2)
Total Protein: 6.7 g/dL (ref 6.0–8.5)
Total Protein: 7 g/dL (ref 6.5–8.1)
eGFR: 7 mL/min/{1.73_m2} — ABNORMAL LOW (ref >59)

## 2023-06-02 LAB — CBC
HCT: 26.1 % — ABNORMAL LOW (ref 39.0–52.0)
Hemoglobin: 7.9 g/dL — ABNORMAL LOW (ref 13.0–17.0)
MCH: 29.8 pg (ref 26.0–34.0)
MCHC: 30.3 g/dL (ref 30.0–36.0)
MCV: 98.5 fL (ref 80.0–100.0)
Platelets: 153 10*3/uL (ref 150–400)
RBC: 2.65 MIL/uL — ABNORMAL LOW (ref 4.22–5.81)
RDW: 12.5 % (ref 11.5–15.5)
WBC: 4.3 10*3/uL (ref 4.0–10.5)
nRBC: 0 % (ref 0.0–0.2)

## 2023-06-02 LAB — LIPID PANEL
Chol/HDL Ratio: 5.4 ratio — ABNORMAL HIGH (ref 0.0–5.0)
Cholesterol, Total: 212 mg/dL — ABNORMAL HIGH (ref 100–199)
HDL: 39 mg/dL — ABNORMAL LOW (ref >39)
LDL Chol Calc (NIH): 153 mg/dL — ABNORMAL HIGH (ref 0–99)
Triglycerides: 110 mg/dL (ref 0–149)
VLDL Cholesterol Cal: 20 mg/dL (ref 5–40)

## 2023-06-02 LAB — HEPATITIS C ANTIBODY: Hep C Virus Ab: NONREACTIVE

## 2023-06-02 LAB — TSH: TSH: 1.94 u[IU]/mL (ref 0.450–4.500)

## 2023-06-02 LAB — POTASSIUM: Potassium: 5.5 mmol/L — ABNORMAL HIGH (ref 3.5–5.1)

## 2023-06-02 LAB — HEMOGLOBIN A1C
Est. average glucose Bld gHb Est-mCnc: 117 mg/dL
Hgb A1c MFr Bld: 5.7 % — ABNORMAL HIGH (ref 4.8–5.6)

## 2023-06-02 LAB — PROTIME-INR
INR: 1 (ref 0.8–1.2)
Prothrombin Time: 13.7 s (ref 11.4–15.2)

## 2023-06-02 LAB — MICROALBUMIN / CREATININE URINE RATIO
Creatinine, Urine: 69.7 mg/dL
Microalb/Creat Ratio: 24 mg/g{creat} (ref 0–29)
Microalbumin, Urine: 16.8 ug/mL

## 2023-06-02 LAB — PSA: Prostate Specific Ag, Serum: 2.7 ng/mL (ref 0.0–4.0)

## 2023-06-02 MED ORDER — SODIUM ZIRCONIUM CYCLOSILICATE 5 G PO PACK
5.0000 g | PACK | ORAL | Status: AC
  Administered 2023-06-02: 5 g via ORAL
  Filled 2023-06-02: qty 1

## 2023-06-02 MED ORDER — ALBUTEROL SULFATE (2.5 MG/3ML) 0.083% IN NEBU: 2.5000 mg | INHALATION_SOLUTION | RESPIRATORY_TRACT | Status: DC

## 2023-06-02 MED ORDER — HEPARIN SODIUM (PORCINE) 5000 UNIT/ML IJ SOLN
5000.0000 [IU] | Freq: Three times a day (TID) | INTRAMUSCULAR | Status: DC
  Administered 2023-06-03 (×3): 5000 [IU] via SUBCUTANEOUS
  Filled 2023-06-02 (×6): qty 1

## 2023-06-02 MED ORDER — LABETALOL HCL 100 MG PO TABS
200.0000 mg | ORAL_TABLET | Freq: Two times a day (BID) | ORAL | Status: DC
  Administered 2023-06-03 – 2023-06-05 (×5): 200 mg via ORAL
  Filled 2023-06-02 (×3): qty 2
  Filled 2023-06-02: qty 1
  Filled 2023-06-02 (×4): qty 2

## 2023-06-02 MED ORDER — POLYETHYLENE GLYCOL 3350 17 G PO PACK: 17.0000 g | PACK | Freq: Every day | ORAL | Status: DC | PRN

## 2023-06-02 MED ORDER — ALBUTEROL SULFATE (2.5 MG/3ML) 0.083% IN NEBU: 2.5000 mg | INHALATION_SOLUTION | RESPIRATORY_TRACT | Status: AC | PRN

## 2023-06-02 MED ORDER — ISOSORBIDE MONONITRATE ER 30 MG PO TB24
30.0000 mg | ORAL_TABLET | Freq: Every day | ORAL | Status: DC
  Administered 2023-06-03 – 2023-06-05 (×3): 30 mg via ORAL
  Filled 2023-06-02 (×3): qty 1

## 2023-06-02 MED ORDER — AMLODIPINE BESYLATE 10 MG PO TABS
10.0000 mg | ORAL_TABLET | Freq: Every day | ORAL | Status: DC
  Administered 2023-06-03 – 2023-06-05 (×3): 10 mg via ORAL
  Filled 2023-06-02 (×3): qty 1

## 2023-06-02 MED ORDER — OMEGA-3-ACID ETHYL ESTERS 1 G PO CAPS
1.0000 g | ORAL_CAPSULE | Freq: Every day | ORAL | Status: DC
  Administered 2023-06-03 – 2023-06-05 (×3): 1 g via ORAL
  Filled 2023-06-02 (×3): qty 1

## 2023-06-02 MED ORDER — CLONIDINE HCL 0.1 MG PO TABS
0.1000 mg | ORAL_TABLET | Freq: Two times a day (BID) | ORAL | Status: DC
  Administered 2023-06-03 – 2023-06-05 (×5): 0.1 mg via ORAL
  Filled 2023-06-02 (×6): qty 1

## 2023-06-02 MED ORDER — SODIUM ZIRCONIUM CYCLOSILICATE 10 G PO PACK
10.0000 g | PACK | Freq: Once | ORAL | Status: AC
  Administered 2023-06-02: 10 g via ORAL
  Filled 2023-06-02: qty 1

## 2023-06-02 MED ORDER — SODIUM CHLORIDE 0.9 % IV BOLUS
1000.0000 mL | Freq: Once | INTRAVENOUS | Status: AC
  Administered 2023-06-02: 1000 mL via INTRAVENOUS

## 2023-06-02 MED ORDER — ACETAMINOPHEN 650 MG RE SUPP: 650.0000 mg | Freq: Four times a day (QID) | RECTAL | Status: DC | PRN

## 2023-06-02 MED ORDER — ACETAMINOPHEN 325 MG PO TABS: 650.0000 mg | ORAL_TABLET | Freq: Four times a day (QID) | ORAL | Status: DC | PRN

## 2023-06-02 MED ORDER — SODIUM CHLORIDE 0.9% FLUSH
3.0000 mL | Freq: Two times a day (BID) | INTRAVENOUS | Status: DC
  Administered 2023-06-02 – 2023-06-05 (×7): 3 mL via INTRAVENOUS

## 2023-06-02 MED ORDER — SODIUM BICARBONATE 650 MG PO TABS
650.0000 mg | ORAL_TABLET | Freq: Two times a day (BID) | ORAL | Status: DC
  Administered 2023-06-02: 650 mg via ORAL
  Filled 2023-06-02: qty 1

## 2023-06-02 NOTE — Telephone Encounter (Signed)
 Spoke with patient just now, expressed the emergent need for hospital. Somehow symptomatically he seems to be feeling well but his labs are very much abnormal. He verbalizes understanding, and knows he will likely be admitted at least for 1 night.

## 2023-06-02 NOTE — ED Triage Notes (Signed)
 Pt sts that he was told that he needed to come to the ED by his PCP. Pt sts that the PCP told him that he could be bleeding somewhere.

## 2023-06-02 NOTE — H&P (Addendum)
 History and Physical    Patient: Trevor Meyer ZOX:[id number] DOB: Dec 07, 1965 DOA: 06/02/2023 DOS: the patient was seen and examined on 06/02/2023 PCP: Leopoldo Rancher, PA  Patient coming from: [facility] by PCP to come in  Chief Complaint:  Chief Complaint  Patient presents with   Abnormal Lab   HPI: [name] is a 58 y.o. male with medical history significant of prior known CKD with baseline creatinine around 2.  Patient also has hypertension for which patient reports taking his medications as prescribed as listed below.  Patient was in his usual state of health till yesterday when he had routine visit to PCP after at least a year and blood testing revealed his creatinine was markedly elevated.  Patient advised to come to the ER.  Patient reports chronic slow stream but otherwise no blood in his urine no hesitancy no back pain no flank pain.  In general he reports his urine output is good.  No vomiting no nausea no diarrhea appetites been okay.  ER course is notable for consultation with Dr. Erminio Hazy pending patient has received Lokelma Review of Systems: As mentioned in the history of present illness. All other systems reviewed and are negative. Past Medical History:  Diagnosis Date   Chronic kidney disease 03/2020   Clotting disorder (HCC) 2000   Hypertension 03/2020   History reviewed. No pertinent surgical history. Social History:  reports that he has never smoked. He has never used smokeless tobacco. He reports that he does not drink alcohol and does not use drugs.  Allergies  Allergen Reactions   Hydralazine  Other (See Comments)    Flushing and tachycardia  Other reaction(s): Other (See Comments)  Flushing and tachycardia  Flushing and tachycardia   Hydralazine  Hcl Other (See Comments)    Flushing and tachycardia    Family History  Problem Relation Age of Onset   Cancer Mother    Stroke Father     Prior to Admission medications   Medication Sig Start Date End  Date Taking? Authorizing Provider  amLODipine  (NORVASC ) 10 MG tablet Take 1 tablet (10 mg total) by mouth daily. 03/27/20 06/02/23 Yes Brenna Cam, MD  cloNIDine  (CATAPRES ) 0.1 MG tablet Take 1 tablet (0.1 mg total) by mouth 2 (two) times daily. 03/26/20  Yes Brenna Cam, MD  irbesartan  (AVAPRO ) 75 MG tablet Take 1 tablet (75 mg total) by mouth every evening. 03/27/20 06/02/23 Yes Brenna Cam, MD  isosorbide  mononitrate (IMDUR ) 30 MG 24 hr tablet Take 1 tablet (30 mg total) by mouth daily. 03/27/20 06/02/23 Yes Brenna Cam, MD  labetalol  (NORMODYNE ) 200 MG tablet Take 1 tablet (200 mg total) by mouth 2 (two) times daily. 03/26/20 06/02/23 Yes Brenna Cam, MD  Misc Natural Products (PROSTATE HEALTH PO) Take by mouth.    [provider]  Omega-3 Fatty Acids (FISH OIL) 1000 MG CAPS Take by mouth.    [provider]    Physical Exam: Vitals:   06/02/23 1241 06/02/23 1445 06/02/23 1500 06/02/23 1530  BP:  120/74 106/71 124/78  Pulse:  69 65 66  Resp:  (!) 26 14 14   Temp:      TempSrc:      SpO2:  100% 100% 100%  Weight: 64.9 kg     Height: 5\' 7"  (1.702 m)      General: Patient is alert and awake, speaks in [place] and declined transfer translator services Respiratory exam: Bilateral intravesicular Cardiovascular exam S1-S2 normal Abdomen all quadrants soft nontender Extremities warm without  edema  Data Reviewed:  Labs on Admission:  Results for orders placed or performed during the hospital encounter of 06/02/23 (from the past 24 hours)  Comprehensive metabolic panel     Status: Abnormal   Collection Time: 06/02/23 12:43 PM  Result Value Ref Range   Sodium 140 135 - 145 mmol/L   Potassium 6.1 (H) 3.5 - 5.1 mmol/L   Chloride 111 98 - 111 mmol/L   CO2 17 (L) 22 - 32 mmol/L   Glucose, Bld 116 (H) 70 - 99 mg/dL   BUN 97 (H) 6 - 20 mg/dL   Creatinine, Ser 9.81 (H) 0.61 - 1.24 mg/dL   Calcium 8.7 (L) 8.9 - 10.3 mg/dL   Total Protein 7.0 6.5 - 8.1 g/dL   Albumin 3.6 3.5 - 5.0  g/dL   AST 13 (L) 15 - 41 U/L   ALT 14 0 - 44 U/L   Alkaline Phosphatase 44 38 - 126 U/L   Total Bilirubin 0.7 0.0 - 1.2 mg/dL   GFR, Estimated 7 (L) >60 mL/min   Anion gap 12 5 - 15  CBC     Status: Abnormal   Collection Time: 06/02/23 12:43 PM  Result Value Ref Range   WBC 4.3 4.0 - 10.5 K/uL   RBC 2.65 (L) 4.22 - 5.81 MIL/uL   Hemoglobin 7.9 (L) 13.0 - 17.0 g/dL   HCT 19.1 (L) 47.8 - 29.5 %   MCV 98.5 80.0 - 100.0 fL   MCH 29.8 26.0 - 34.0 pg   MCHC 30.3 30.0 - 36.0 g/dL   RDW 62.1 30.8 - 65.7 %   Platelets 153 150 - 400 K/uL   nRBC 0.0 0.0 - 0.2 %  Type and screen [facility]     Status: None   Collection Time: 06/02/23 12:43 PM  Result Value Ref Range   ABO/RH(D) B POS    Antibody Screen NEG    Sample Expiration      06/05/2023,2359 Performed at [facility], [address] [place], [place] [address]   Protime-INR - (order if Patient is taking Coumadin / Warfarin)     Status: None   Collection Time: 06/02/23 12:43 PM  Result Value Ref Range   Prothrombin Time 13.7 11.4 - 15.2 seconds   INR 1.0 0.8 - 1.2   Basic Metabolic Panel: Recent Labs  Lab 06/01/23 1119 06/02/23 1243  NA 144 140  K 6.3* 6.1*  CL 113* 111  CO2 15* 17*  GLUCOSE 95 116*  BUN 99* 97*  CREATININE 8.68* 8.39*  CALCIUM 8.6* 8.7*   Liver Function Tests: Recent Labs  Lab 06/01/23 1119 06/02/23 1243  AST 11 13*  ALT 11 14  ALKPHOS 53 44  BILITOT <0.2 0.7  PROT 6.7 7.0  ALBUMIN 4.3 3.6   No results for input(s): "LIPASE", "AMYLASE" in the last 168 hours. No results for input(s): "AMMONIA" in the last 168 hours. CBC: Recent Labs  Lab 06/01/23 1119 06/02/23 1243  WBC 4.3 4.3  NEUTROABS 2.6  --   HGB 7.9* 7.9*  HCT 24.6* 26.1*  MCV 94 98.5  PLT 173 153   Cardiac Enzymes: No results for input(s): "CKTOTAL", "CKMB", "CKMBINDEX", "TROPONINIHS" in the last 168 hours.  BNP (last 3 results) No results for input(s): "PROBNP" in the last 8760  hours. CBG: No results for input(s): "GLUCAP" in the last 168 hours.  Radiological Exams on Admission:  No results found.  chest X-ray  EKG: Independently reviewed. No peaked  T waves  No intake/output data recorded. No intake/output data recorded.     Assessment and Plan: * ESRD (end stage renal disease) (HCC) T/r/o AKI. A.w. hyperkalemia K is 6.1 with no EKG changes. Check US  renal. UA, lytes and Cr. Will give 1 liter  NS bolus empiricaly.  Keep on telemetry. S.p lokelma. Will treat with albuterol and low k diet. Dr. Rhesa Celeste engaged by ER doc  Metabolic acidosis. Start sodium bicarboante.  Anemia Normo cytic normochronic. Will send anemia panel for AM. Not at transfusion threshold. Check SPEP  Resistant hypertension Chornic. I hae ordeed imdur , clonidine , norvasc  and labetalol  as advised by patient to me. Hold ARB due to high K      Advance Care Planning:   Code Status: Full Code   Consults: Renal  Family Communication: per pateint. Declined ofer to call spouse.  Severity of Illness: The appropriate patient status for this patient is INPATIENT. Inpatient status is judged to be reasonable and necessary in order to provide the required intensity of service to ensure the patient's safety. The patient's presenting symptoms, physical exam findings, and initial radiographic and laboratory data in the context of their chronic comorbidities is felt to place them at high risk for further clinical deterioration. Furthermore, it is not anticipated that the patient will be medically stable for discharge from the hospital within 2 midnights of admission.   * I certify that at the point of admission it is my clinical judgment that the patient will require inpatient hospital care spanning beyond 2 midnights from the point of admission due to high intensity of service, high risk for further deterioration and high frequency of surveillance required.*  Author: Bennie Brave, MD 06/02/2023  4:54 PM  For on call review [email or web address].

## 2023-06-02 NOTE — Assessment & Plan Note (Addendum)
 T/r/o AKI. A.w. hyperkalemia K is 6.1 with no EKG changes. Check US  renal. UA, lytes and Cr. Will give 1 liter  NS bolus empiricaly.  Keep on telemetry. S.p lokelma. Will treat with albuterol and low k diet. Dr. Rhesa Celeste engaged by ER doc  Metabolic acidosis. Start sodium bicarboante.

## 2023-06-02 NOTE — ED Notes (Signed)
 [place] at bedside

## 2023-06-02 NOTE — Telephone Encounter (Signed)
 Called twice, LVM, also called wife and LVM. Patient needs to go to the hospital today for severe anemia and failing kidneys. He needs to have imaging to look for an active bleed in the chest or abdomen. This cannot wait.

## 2023-06-02 NOTE — ED Provider Notes (Signed)
 [facility] Provider Note    Event Date/Time   First MD Initiated Contact with Patient 06/02/23 1500     (approximate)   History   Abnormal Lab   HPI  Trevor Meyer is a 58 y.o. male sent in by PCP for abnormal lab values.  Patient reports he feels well and has no physical complaints at this time.  Review of record demonstrates that he follows with nephrology for chronic kidney disease.     Physical Exam   Triage Vital Signs: ED Triage Vitals  Encounter Vitals Group     BP 06/02/23 1240 (!) 160/98     Systolic BP Percentile --      Diastolic BP Percentile --      Pulse Rate 06/02/23 1240 91     Resp 06/02/23 1240 18     Temp 06/02/23 1240 98 F (36.7 C)     Temp Source 06/02/23 1240 Oral     SpO2 06/02/23 1240 100 %     Weight 06/02/23 1241 64.9 kg (143 lb)     Height 06/02/23 1241 1.702 m (5\' 7" )     Head Circumference --      Peak Flow --      Pain Score 06/02/23 1241 0     Pain Loc --      Pain Education --      Exclude from Growth Chart --     Most recent vital signs: Vitals:   06/02/23 1445 06/02/23 1500  BP: 120/74 106/71  Pulse: 69 65  Resp: (!) 26 14  Temp:    SpO2: 100% 100%     General: Awake, no distress.  CV:  Good peripheral perfusion.  Resp:  Normal effort.  Abd:  No distention. Other:     ED Results / Procedures / Treatments   Labs (all labs ordered are listed, but only abnormal results are displayed) Labs Reviewed  COMPREHENSIVE METABOLIC PANEL WITH GFR - Abnormal; Notable for the following components:      Result Value   Potassium 6.1 (*)    CO2 17 (*)    Glucose, Bld 116 (*)    BUN 97 (*)    Creatinine, Ser 8.39 (*)    Calcium 8.7 (*)    AST 13 (*)    GFR, Estimated 7 (*)    All other components within normal limits  CBC - Abnormal; Notable for the following components:   RBC 2.65 (*)    Hemoglobin 7.9 (*)    HCT 26.1 (*)    All other components within normal limits  PROTIME-INR  POC OCCULT  BLOOD, ED  TYPE AND SCREEN     EKG  ED ECG REPORT I, Bryson Carbine, the attending physician, personally viewed and interpreted this ECG.  Date: 06/02/2023  Rhythm: normal sinus rhythm QRS Axis: normal Intervals: normal ST/T Wave abnormalities: normal Narrative Interpretation: no evidence of acute ischemia    RADIOLOGY  Ultrasound renal ordered at nephrology request   PROCEDURES:  Critical Care performed: no  Procedures   MEDICATIONS ORDERED IN ED: Medications  sodium zirconium cyclosilicate (LOKELMA) packet 10 g (has no administration in time range)     IMPRESSION / MDM / ASSESSMENT AND PLAN / ED COURSE  I reviewed the triage vital signs and the nursing notes. Patient's presentation is most consistent with severe exacerbation of chronic illness.  Patient sent in by PCP for abnormal lab values.  Recheck labs today.  Baseline creatinine typically between  2 and 3 several years ago, creatinine today is 8.68 with a BUN of 99, his potassium is elevated at 6.3  His hemoglobin is low at 7.9, this is unchanged from yesterday, he reports normal stools no abdominal pain or abnormalities there.  I suspect this is related to anemia of chronic disease  Discussed with Dr. Rhesa Celeste of nephrology he recommends renal ultrasound, Lokelma for elevated potassium, he will consult on the patient, admission to the hospitalist team, have consulted the hospitalist        FINAL CLINICAL IMPRESSION(S) / ED DIAGNOSES   Final diagnoses:  ESRD (end stage renal disease) (HCC)  Hyperkalemia     Rx / DC Orders   ED Discharge Orders     None        Note:  This document was prepared using Dragon voice recognition software and may include unintentional dictation errors.   Bryson Carbine, MD 06/02/23 [phone]

## 2023-06-02 NOTE — Assessment & Plan Note (Addendum)
 Normo cytic normochronic. Will send anemia panel for AM. Not at transfusion threshold. Check SPEP

## 2023-06-02 NOTE — Assessment & Plan Note (Signed)
 Chornic. I hae ordeed imdur , clonidine , norvasc  and labetalol  as advised by patient to me. Hold ARB due to high K

## 2023-06-02 NOTE — Progress Notes (Signed)
 Pt states he took own bedtime meds. Instructed he can't take own meds

## 2023-06-03 DIAGNOSIS — N186 End stage renal disease: Secondary | ICD-10-CM | POA: Diagnosis not present

## 2023-06-03 LAB — CBC
HCT: 20.9 % — ABNORMAL LOW (ref 39.0–52.0)
HCT: 22.6 % — ABNORMAL LOW (ref 39.0–52.0)
Hemoglobin: 6.7 g/dL — ABNORMAL LOW (ref 13.0–17.0)
Hemoglobin: 7.2 g/dL — ABNORMAL LOW (ref 13.0–17.0)
MCH: 30.5 pg (ref 26.0–34.0)
MCH: 29.9 pg (ref 26.0–34.0)
MCHC: 32.1 g/dL (ref 30.0–36.0)
MCHC: 31.9 g/dL (ref 30.0–36.0)
MCV: 95 fL (ref 80.0–100.0)
MCV: 93.8 fL (ref 80.0–100.0)
Platelets: 132 10*3/uL — ABNORMAL LOW (ref 150–400)
Platelets: 124 10*3/uL — ABNORMAL LOW (ref 150–400)
RBC: 2.2 MIL/uL — ABNORMAL LOW (ref 4.22–5.81)
RBC: 2.41 MIL/uL — ABNORMAL LOW (ref 4.22–5.81)
RDW: 12.3 % (ref 11.5–15.5)
RDW: 12.5 % (ref 11.5–15.5)
WBC: 3.6 10*3/uL — ABNORMAL LOW (ref 4.0–10.5)
WBC: 3.7 10*3/uL — ABNORMAL LOW (ref 4.0–10.5)
nRBC: 0 % (ref 0.0–0.2)
nRBC: 0 % (ref 0.0–0.2)

## 2023-06-03 LAB — RETICULOCYTES
Immature Retic Fract: 15.8 % (ref 2.3–15.9)
RBC.: 2.2 MIL/uL — ABNORMAL LOW (ref 4.22–5.81)
Retic Count, Absolute: 42.2 10*3/uL (ref 19.0–186.0)
Retic Ct Pct: 1.9 % (ref 0.4–3.1)

## 2023-06-03 LAB — PHOSPHORUS: Phosphorus: 5.2 mg/dL — ABNORMAL HIGH (ref 2.5–4.6)

## 2023-06-03 LAB — IRON AND TIBC
Iron: 38 ug/dL — ABNORMAL LOW (ref 45–182)
Saturation Ratios: 13 % — ABNORMAL LOW (ref 17.9–39.5)
TIBC: 301 ug/dL (ref 250–450)
UIBC: 263 ug/dL

## 2023-06-03 LAB — BASIC METABOLIC PANEL WITH GFR
Anion gap: 10 (ref 5–15)
BUN: 96 mg/dL — ABNORMAL HIGH (ref 6–20)
CO2: 16 mmol/L — ABNORMAL LOW (ref 22–32)
Calcium: 7.7 mg/dL — ABNORMAL LOW (ref 8.9–10.3)
Chloride: 112 mmol/L — ABNORMAL HIGH (ref 98–111)
Creatinine, Ser: 8.45 mg/dL — ABNORMAL HIGH (ref 0.61–1.24)
GFR, Estimated: 7 mL/min — ABNORMAL LOW (ref >60)
Glucose, Bld: 108 mg/dL — ABNORMAL HIGH (ref 70–99)
Potassium: 5.5 mmol/L — ABNORMAL HIGH (ref 3.5–5.1)
Sodium: 138 mmol/L (ref 135–145)

## 2023-06-03 LAB — URINALYSIS, COMPLETE (UACMP) WITH MICROSCOPIC
Bacteria, UA: NONE SEEN
Bilirubin Urine: NEGATIVE
Glucose, UA: 150 mg/dL — AB
Hgb urine dipstick: NEGATIVE
Ketones, ur: NEGATIVE mg/dL
Leukocytes,Ua: NEGATIVE
Nitrite: NEGATIVE
Protein, ur: NEGATIVE mg/dL
RBC / HPF: 0 RBC/hpf (ref 0–5)
Specific Gravity, Urine: 1.009 (ref 1.005–1.030)
Squamous Epithelial / HPF: 0 /HPF (ref 0–5)
pH: 6 (ref 5.0–8.0)

## 2023-06-03 LAB — HIV ANTIBODY (ROUTINE TESTING W REFLEX): HIV Screen 4th Generation wRfx: NONREACTIVE

## 2023-06-03 LAB — APTT: aPTT: 33 s (ref 24–36)

## 2023-06-03 LAB — FERRITIN: Ferritin: 13 ng/mL — ABNORMAL LOW (ref 24–336)

## 2023-06-03 LAB — CREATININE, URINE, RANDOM: Creatinine, Urine: 65 mg/dL

## 2023-06-03 LAB — PREPARE RBC (CROSSMATCH)

## 2023-06-03 LAB — FOLATE: Folate: 19.9 ng/mL (ref >5.9)

## 2023-06-03 LAB — SODIUM, URINE, RANDOM: Sodium, Ur: 90 mmol/L

## 2023-06-03 LAB — PROTIME-INR
INR: 1.1 (ref 0.8–1.2)
Prothrombin Time: 14.7 s (ref 11.4–15.2)

## 2023-06-03 LAB — VITAMIN D 25 HYDROXY (VIT D DEFICIENCY, FRACTURES): Vit D, 25-Hydroxy: 110.1 ng/mL — ABNORMAL HIGH (ref 30–100)

## 2023-06-03 LAB — MAGNESIUM: Magnesium: 3.3 mg/dL — ABNORMAL HIGH (ref 1.7–2.4)

## 2023-06-03 LAB — HEMOGLOBIN AND HEMATOCRIT, BLOOD
HCT: 22.3 % — ABNORMAL LOW (ref 39.0–52.0)
Hemoglobin: 7 g/dL — ABNORMAL LOW (ref 13.0–17.0)

## 2023-06-03 LAB — VITAMIN B12: Vitamin B-12: 594 pg/mL (ref 180–914)

## 2023-06-03 MED ORDER — SODIUM CHLORIDE 0.9% IV SOLUTION: Freq: Once | INTRAVENOUS | Status: AC

## 2023-06-03 MED ORDER — SODIUM ZIRCONIUM CYCLOSILICATE 10 G PO PACK
10.0000 g | PACK | Freq: Three times a day (TID) | ORAL | Status: AC
  Administered 2023-06-03 (×2): 10 g via ORAL
  Filled 2023-06-03 (×2): qty 1

## 2023-06-03 MED ORDER — SODIUM BICARBONATE 650 MG PO TABS
1300.0000 mg | ORAL_TABLET | Freq: Three times a day (TID) | ORAL | Status: DC
  Administered 2023-06-03 – 2023-06-05 (×7): 1300 mg via ORAL
  Filled 2023-06-03 (×7): qty 2

## 2023-06-03 MED ORDER — POLYSACCHARIDE IRON COMPLEX 150 MG PO CAPS
150.0000 mg | ORAL_CAPSULE | Freq: Every day | ORAL | Status: DC
Start: 2023-06-03 — End: 2023-06-05
  Administered 2023-06-03 – 2023-06-05 (×3): 150 mg via ORAL
  Filled 2023-06-03 (×3): qty 1

## 2023-06-03 NOTE — Progress Notes (Addendum)
 [facility]  ROUNDING NOTE   Subjective:  Patient laying in bed, wife at bedside. No acute distress noted. Patient denies shortness of breath, denies pain. Counseled on possible start of renal replacement therapy.   Objective:  Vital signs in last 24 hours:  Temp:  [97.5 F (36.4 C)-98.2 F (36.8 C)] 98 F (36.7 C) (05/24 0421) Pulse Rate:  [65-91] 83 (05/24 0421) Resp:  [14-26] 16 (05/24 0421) BP: (106-160)/(58-98) 123/70 (05/24 0421) SpO2:  [98 %-100 %] 98 % (05/24 0421) Weight:  [64.9 kg] 64.9 kg (05/23 1241)  Weight change:  Filed Weights   06/02/23 1241  Weight: 64.9 kg    Intake/Output: No intake/output data recorded.   Intake/Output this shift:  Total I/O In: -  Out: 250 [Urine:250]  Physical Exam: General: NAD,   Head: Normocephalic, atraumatic. Moist oral mucosal membranes  Eyes: Anicteric, PERRL  Neck: Supple, trachea midline  Lungs:  Clear to auscultation  Heart: Regular rate and rhythm  Abdomen:  Soft, nontender,   Extremities:  No peripheral edema.  Neurologic: Nonfocal, moving all four extremities  Skin: No lesions  Access: None    Basic Metabolic Panel: Recent Labs  Lab 06/01/23 1119 06/02/23 1243 06/02/23 1743 06/03/23 0512  NA 144 140  --  138  K 6.3* 6.1* 5.5* 5.5*  CL 113* 111  --  112*  CO2 15* 17*  --  16*  GLUCOSE 95 116*  --  108*  BUN 99* 97*  --  96*  CREATININE 8.68* 8.39*  --  8.45*  CALCIUM 8.6* 8.7*  --  7.7*    Liver Function Tests: Recent Labs  Lab 06/01/23 1119 06/02/23 1243  AST 11 13*  ALT 11 14  ALKPHOS 53 44  BILITOT <0.2 0.7  PROT 6.7 7.0  ALBUMIN 4.3 3.6   No results for input(s): "LIPASE", "AMYLASE" in the last 168 hours. No results for input(s): "AMMONIA" in the last 168 hours.  CBC: Recent Labs  Lab 06/01/23 1119 06/02/23 1243 06/03/23 0512 06/03/23 0916  WBC 4.3 4.3 3.6*  --   NEUTROABS 2.6  --   --   --   HGB 7.9* 7.9* 6.7* 7.0*  HCT 24.6* 26.1* 20.9* 22.3*  MCV 94 98.5  95.0  --   PLT 173 153 132*  --     Cardiac Enzymes: No results for input(s): "CKTOTAL", "CKMB", "CKMBINDEX", "TROPONINI" in the last 168 hours.  BNP: Invalid input(s): "POCBNP"  CBG: No results for input(s): "GLUCAP" in the last 168 hours.  Microbiology: Results for orders placed or performed during the hospital encounter of 03/21/20  Resp Panel by RT-PCR (Flu A&B, Covid) Nasopharyngeal Swab     Status: None   Collection Time: 03/21/20 11:32 PM   Specimen: Nasopharyngeal Swab; Nasopharyngeal(NP) swabs in vial transport medium  Result Value Ref Range Status   SARS Coronavirus 2 by RT PCR NEGATIVE NEGATIVE Final    Comment: (NOTE) SARS-CoV-2 target nucleic acids are NOT DETECTED.  The SARS-CoV-2 RNA is generally detectable in upper respiratory specimens during the acute phase of infection. The lowest concentration of SARS-CoV-2 viral copies this assay can detect is 138 copies/mL. A negative result does not preclude SARS-Cov-2 infection and should not be used as the sole basis for treatment or other patient management decisions. A negative result may occur with  improper specimen collection/handling, submission of specimen other than nasopharyngeal swab, presence of viral mutation(s) within the areas targeted by this assay, and inadequate number of viral copies(<138 copies/mL).  A negative result must be combined with clinical observations, patient history, and epidemiological information. The expected result is Negative.  Fact Sheet for Patients:  BloggerCourse.com  Fact Sheet for Healthcare Providers:  SeriousBroker.it  This test is no t yet approved or cleared by the [place]  FDA and  has been authorized for detection and/or diagnosis of SARS-CoV-2 by FDA under an Emergency Use Authorization (EUA). This EUA will remain  in effect (meaning this test can be used) for the duration of the COVID-19 declaration under  Section 564(b)(1) of the Act, 21 U.S.C.section 360bbb-3(b)(1), unless the authorization is terminated  or revoked sooner.       Influenza A by PCR NEGATIVE NEGATIVE Final   Influenza B by PCR NEGATIVE NEGATIVE Final    Comment: (NOTE) The Xpert Xpress SARS-CoV-2/FLU/RSV plus assay is intended as an aid in the diagnosis of influenza from Nasopharyngeal swab specimens and should not be used as a sole basis for treatment. Nasal washings and aspirates are unacceptable for Xpert Xpress SARS-CoV-2/FLU/RSV testing.  Fact Sheet for Patients: BloggerCourse.com  Fact Sheet for Healthcare Providers: SeriousBroker.it  This test is not yet approved or cleared by the [place]  FDA and has been authorized for detection and/or diagnosis of SARS-CoV-2 by FDA under an Emergency Use Authorization (EUA). This EUA will remain in effect (meaning this test can be used) for the duration of the COVID-19 declaration under Section 564(b)(1) of the Act, 21 U.S.C. section 360bbb-3(b)(1), unless the authorization is terminated or revoked.  Performed at [facility], [address] [place], [place] [address]   MRSA PCR Screening     Status: None   Collection Time: 03/22/20  6:18 AM   Specimen: Nasopharyngeal  Result Value Ref Range Status   MRSA by PCR NEGATIVE NEGATIVE Final    Comment:        The GeneXpert MRSA Assay (FDA approved for NASAL specimens only), is one component of a comprehensive MRSA colonization surveillance program. It is not intended to diagnose MRSA infection nor to guide or monitor treatment for MRSA infections. Performed at [facility], [address], [place], [place] [address]   Urine Culture     Status: Abnormal   Collection Time: 03/23/20 11:44 PM   Specimen: Urine, Clean Catch  Result Value Ref Range Status   Specimen Description   Final    URINE, CLEAN CATCH Performed at [facility], [address] [place], [place] [address]    Special Requests   Final    NONE Performed at [facility], [address] [place], [place] [address]    Culture (A)  Final    10,000 COLONIES/mL GROUP B STREP(S.AGALACTIAE)ISOLATED TESTING AGAINST S. AGALACTIAE NOT ROUTINELY PERFORMED DUE TO PREDICTABILITY OF AMP/PEN/VAN SUSCEPTIBILITY. Performed at [facility], [address], [place], [place] [address]    Report Status 03/25/2020 FINAL  Final    Coagulation Studies: Recent Labs    06/02/23 1243 06/03/23 0512  LABPROT 13.7 14.7  INR 1.0 1.1    Urinalysis: Recent Labs    06/03/23 1055  COLORURINE COLORLESS*  LABSPEC 1.009  PHURINE 6.0  GLUCOSEU 150*  HGBUR NEGATIVE  BILIRUBINUR NEGATIVE  KETONESUR NEGATIVE  PROTEINUR NEGATIVE  NITRITE NEGATIVE  LEUKOCYTESUR NEGATIVE      Imaging: US  Renal Result Date: 06/02/2023 CLINICAL DATA:  Kidney failure EXAM: RENAL / URINARY TRACT ULTRASOUND COMPLETE COMPARISON:  CTA 03/22/2020. FINDINGS: Right Kidney: Renal measurements: 16.6 x 9.8 x 9.3 cm = volume: 72.7  mL. Parenchymal atrophy with extensive cystic changes seen throughout the renal parenchyma. Dominant foci measure up to 4.8 cm and 4.0 cm but there are numerable foci identified. These were seen on previous CTA as well. Please correlate with clinical history. No obvious collecting system dilatation. Left Kidney: Renal measurements: 14.0 x 9.2 x 10.2 cm = volume: 685.0 mL. Parenchymal atrophy with once again extensive cystic foci throughout the renal parenchyma. Again these measure up to 5 cm and 3.9 cm. Numerous smaller foci identified as well. No separate collecting system dilatation clearly seen. Bladder: Slight wall thickening of the urinary bladder. Other: None. IMPRESSION: Innumerable bilateral renal cystic foci. Vast majority appears simple. No separate collecting system dilatation. Electronically Signed   By: Adrianna Horde M.D.   On: 06/02/2023 17:42      Medications:     sodium chloride    Intravenous Once   amLODipine   10 mg Oral Daily   cloNIDine   0.1 mg Oral BID   heparin   5,000 Units Subcutaneous Q8H   iron polysaccharides  150 mg Oral Daily   isosorbide  mononitrate  30 mg Oral Daily   labetalol   200 mg Oral BID   omega-3 acid ethyl esters  1 g Oral Daily   sodium bicarbonate  1,300 mg Oral TID   sodium chloride  flush  3 mL Intravenous Q12H   sodium zirconium cyclosilicate  10 g Oral TID   acetaminophen **OR** acetaminophen, albuterol, polyethylene glycol  Assessment/ Plan:  Mr. Trevor Meyer is a 58 y.o.  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 Is know to our practice and was last seen by Dr. Zelda Hickman 2 years ago. Abnormal labs noted by PCP after visit for elevated creatinine levels and was told to come to ED. Patient was found to have acute kidney injury with electrolyte derangements.  Acute Kidney Injury on Chronic Kidney Disease of unknown stage. History of AKI due to IV contrast in 2022 Baseline creatinine at 2 and is now elevated. Patient has not been to Nephology office in 2 years. Discussed with patient possibility of starting renal replacement therapy due to continued elevated creatinine levels. Continue judicious use of fluids, avoid renal toxic agents, provide appropriate renal dosage of medications. Strict intake and output. Daily BMP  Lab Results  Component Value Date   CREATININE 8.45 (H) 06/03/2023   CREATININE 8.39 (H) 06/02/2023   CREATININE 8.68 (H) 06/01/2023    Intake/Output Summary (Last 24 hours) at 06/03/2023 1233 Last data filed at 06/03/2023 1000 Gross per 24 hour  Intake --  Output 250 ml  Net -250 ml    2. Hypertension  Currently on amlodipine , clonidine , isosorbide , and labetalol . BP 123/70 today  3. Anemia of chronic disease 06/03/2023 HgB 7.0.  Recommend blood transfusion for HgB<7 Daily CBC, will  consider ESA with renal replacement therapy  4. Secondary Hyperparathyroidism PTH pending 02/18/2021 Phos 3.3, 06/03/2023 Ca 7.7 Will monitor bone mineral levels this admission.   5. Hyperkalemia 06/03/2023 potassium 5.5 Patient given Lokelma by primary team   LOS: 1 Trevor Meyer 5/24/202512:16 PM

## 2023-06-03 NOTE — Progress Notes (Signed)
 [facility] Progress Note  Patient: Trevor Meyer    MWU:[id number]  DOA: 06/02/2023     Date of Service: the patient was seen and examined on 06/03/2023  Chief Complaint  Patient presents with   Abnormal Lab   Brief hospital course: Trevor Meyer is a 58 y.o. male with PMH of HTN, CKD with baseline creatinine around 2, polycystic kidney disease, 1. Acute aortic syndrome - intramural hematoma extending from the left subclavian artery origin to the diaphragmatic hiatus, as reviewed per EMR, patient presented at [facility] ED due to abnormal labs. As per patient he did not have any symptoms and went to PCP for routine blood work which showed elevated creatinine and he was advised to go to the ED.  Patient denied any complaint in the ED.  ED workup: VS afebrile, HR 91, RR 18, BP 160/98, 100% on room air BMP potassium 6.3, bicarb 15, creatinine 8.68, calcium 8.6 LDL 153, total cholesterol 214 CBC hemoglobin 7.9 anemia  ED physician notified nephrology and [facility] was given due to hyperkalemia.  [facility] was consulted for admission and further management as below.  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 Vast majority appears simple. No separate collecting system dilatation. 5/24 Bladder scan 120 mL PVR after voiding 780 cc Monitor renal functions and urine output Avoid nephrotoxic medications Use renally dose medications Follow nephrology for further recommendation   # Hyperkalemia due to AKI/CKD 5 Lokelma given Monitor potassium level daily Continue to monitor on telemetry  # Metabolic acidosis due to CKD Continue oral bicarbonate Monitor BMP daily  # Anemia secondary to CKD and iron deficiency anemia Hemoglobin 7.0 today Transfuse 1 unit  of PRBC Monitor H&H   # Iron deficiency, transferrin saturation 13% Started oral iron supplement Folate and B12 within normal range  # Resistant hypertension Continued amlodipine  10 mg, clonidine  0.1 mg p.o. twice daily, Imdur  30 mg p.o. daily, labetalol  200 mg p.o. twice daily Monitor BP and titrate medication accordingly   Body mass index is 22.4 kg/m.  Interventions:  Diet: Renal diet, low potassium DVT Prophylaxis: Subcutaneous Heparin     Advance goals of care discussion: Full code  Family Communication: family was present at bedside, at the time of interview.  The pt provided permission to discuss medical plan with the family. Opportunity was given to ask question and all questions were answered satisfactorily.   Disposition:  Pt is from Home, admitted with acute renal failure, still has elevated creatinine, which precludes a safe discharge. Discharge to home, when stable and cleared by nephrology.  May need few days to improve.  Subjective: No significant events overnight, patient denied any abdominal pain, no chest pain or palpitations, no shortness of breath.  Physical Exam: General: NAD, lying comfortably Appear in no distress, affect appropriate Eyes: PERRLA ENT: Oral Mucosa Clear, moist  Neck: no JVD,  Cardiovascular: S1 and S2 Present, no Murmur,  Respiratory: good respiratory effort, Bilateral Air entry equal and Decreased, no Crackles, no wheezes Abdomen: Bowel Sound present, Soft and no tenderness,  Skin: no rashes Extremities: no Pedal edema, no calf tenderness Neurologic: without any new focal findings Gait not checked due to patient safety concerns  Vitals:   06/03/23 0421 06/03/23 1230 06/03/23 1300 06/03/23 1315  BP: 123/70 115/75 113/71 118/72  Pulse: 83 76 72 75  Resp: 16 16  16   Temp: 98 F (36.7 C) 98.7 F (37.1 C) 98.6 F (37 C) 98.7 F (37.1 C)  TempSrc: Oral Oral Oral   SpO2: 98% 98% 98%   Weight:      Height:         Intake/Output Summary (Last 24 hours) at 06/03/2023 1505 Last data filed at 06/03/2023 1000 Gross per 24 hour  Intake --  Output 250 ml  Net -250 ml   Filed Weights   06/02/23 1241  Weight: 64.9 kg    Data Reviewed: I have personally reviewed and interpreted daily labs, tele strips, imagings as discussed above. I reviewed all nursing notes, pharmacy notes, vitals, pertinent old records I have discussed plan of care as described above with RN and patient/family.  CBC: Recent Labs  Lab 06/01/23 1119 06/02/23 1243 06/03/23 0512 06/03/23 0916  WBC 4.3 4.3 3.6*  --   NEUTROABS 2.6  --   --   --   HGB 7.9* 7.9* 6.7* 7.0*  HCT 24.6* 26.1* 20.9* 22.3*  MCV 94 98.5 95.0  --   PLT 173 153 132*  --    Basic Metabolic Panel: Recent Labs  Lab 06/01/23 1119 06/02/23 1243 06/02/23 1743 06/03/23 0512  NA 144 140  --  138  K 6.3* 6.1* 5.5* 5.5*  CL 113* 111  --  112*  CO2 15* 17*  --  16*  GLUCOSE 95 116*  --  108*  BUN 99* 97*  --  96*  CREATININE 8.68* 8.39*  --  8.45*  CALCIUM 8.6* 8.7*  --  7.7*  MG  --   --   --  3.3*  PHOS  --   --   --  5.2*    Studies: US  Renal Result Date: 06/02/2023 CLINICAL DATA:  Kidney failure EXAM: RENAL / URINARY TRACT ULTRASOUND COMPLETE COMPARISON:  CTA 03/22/2020. FINDINGS: Right Kidney: Renal measurements: 16.6 x 9.8 x 9.3 cm = volume: 72.7 mL. Parenchymal atrophy with extensive cystic changes seen throughout the renal parenchyma. Dominant foci measure up to 4.8 cm and 4.0 cm but there are numerable foci identified. These were seen on previous CTA as well. Please correlate with clinical history. No obvious collecting system dilatation. Left Kidney: Renal measurements: 14.0 x 9.2 x 10.2 cm = volume: 685.0 mL. Parenchymal atrophy with once again extensive cystic foci throughout the renal parenchyma. Again these measure up to 5 cm and 3.9 cm. Numerous smaller foci identified as well. No separate collecting system dilatation clearly seen.  Bladder: Slight wall thickening of the urinary bladder. Other: None. IMPRESSION: Innumerable bilateral renal cystic foci. Vast majority appears simple. No separate collecting system dilatation. Electronically Signed   By: Adrianna Horde M.D.   On: 06/02/2023 17:42    Scheduled Meds:  amLODipine   10 mg Oral Daily   cloNIDine   0.1 mg Oral BID   heparin   5,000 Units Subcutaneous Q8H   iron polysaccharides  150 mg Oral Daily   isosorbide  mononitrate  30 mg Oral Daily   labetalol   200 mg Oral BID   omega-3 acid ethyl esters  1 g Oral Daily   sodium bicarbonate  1,300 mg Oral TID   sodium chloride  flush  3 mL Intravenous Q12H   sodium zirconium cyclosilicate  10 g Oral TID   Continuous Infusions: PRN Meds: acetaminophen **OR** acetaminophen, albuterol, polyethylene glycol  Time spent: 55 minutes  Author: Mickiel Albany  Hubert Madden MD [facility] 06/03/2023 3:05 PM  To reach On-call, see care teams to locate the attending and reach out to them via [email or web address]. If 7PM-7AM, please contact night-coverage If you still have difficulty reaching the attending provider, please page the [facility] (Director on Call) for [facility] on amion for assistance.

## 2023-06-03 NOTE — Plan of Care (Signed)

## 2023-06-04 DIAGNOSIS — N186 End stage renal disease: Secondary | ICD-10-CM | POA: Diagnosis not present

## 2023-06-04 LAB — TYPE AND SCREEN
ABO/RH(D): B POS
Antibody Screen: NEGATIVE
Unit division: 0

## 2023-06-04 LAB — BASIC METABOLIC PANEL WITH GFR
Anion gap: 9 (ref 5–15)
BUN: 91 mg/dL — ABNORMAL HIGH (ref 6–20)
CO2: 21 mmol/L — ABNORMAL LOW (ref 22–32)
Calcium: 7.8 mg/dL — ABNORMAL LOW (ref 8.9–10.3)
Chloride: 114 mmol/L — ABNORMAL HIGH (ref 98–111)
Creatinine, Ser: 8.53 mg/dL — ABNORMAL HIGH (ref 0.61–1.24)
GFR, Estimated: 7 mL/min — ABNORMAL LOW (ref >60)
Glucose, Bld: 106 mg/dL — ABNORMAL HIGH (ref 70–99)
Potassium: 5 mmol/L (ref 3.5–5.1)
Sodium: 144 mmol/L (ref 135–145)

## 2023-06-04 LAB — MAGNESIUM: Magnesium: 3.1 mg/dL — ABNORMAL HIGH (ref 1.7–2.4)

## 2023-06-04 LAB — CBC
HCT: 23.9 % — ABNORMAL LOW (ref 39.0–52.0)
Hemoglobin: 8.1 g/dL — ABNORMAL LOW (ref 13.0–17.0)
MCH: 31.9 pg (ref 26.0–34.0)
MCHC: 33.9 g/dL (ref 30.0–36.0)
MCV: 94.1 fL (ref 80.0–100.0)
Platelets: 131 10*3/uL — ABNORMAL LOW (ref 150–400)
RBC: 2.54 MIL/uL — ABNORMAL LOW (ref 4.22–5.81)
RDW: 12.4 % (ref 11.5–15.5)
WBC: 4.1 10*3/uL (ref 4.0–10.5)
nRBC: 0 % (ref 0.0–0.2)

## 2023-06-04 LAB — BPAM RBC
Blood Product Expiration Date: 202506252359
ISSUE DATE / TIME: 202505241316
Unit Type and Rh: 7300

## 2023-06-04 LAB — HEMOGLOBIN AND HEMATOCRIT, BLOOD
HCT: 24 % — ABNORMAL LOW (ref 39.0–52.0)
Hemoglobin: 7.8 g/dL — ABNORMAL LOW (ref 13.0–17.0)

## 2023-06-04 LAB — PHOSPHORUS: Phosphorus: 5.6 mg/dL — ABNORMAL HIGH (ref 2.5–4.6)

## 2023-06-04 LAB — PARATHYROID HORMONE, INTACT (NO CA): PTH: 28 pg/mL (ref 15–65)

## 2023-06-04 MED ORDER — CALCIUM ACETATE (PHOS BINDER) 667 MG PO CAPS
667.0000 mg | ORAL_CAPSULE | Freq: Three times a day (TID) | ORAL | Status: DC
  Administered 2023-06-04 – 2023-06-05 (×2): 667 mg via ORAL
  Filled 2023-06-04 (×2): qty 1

## 2023-06-04 MED ORDER — PANTOPRAZOLE SODIUM 40 MG IV SOLR
40.0000 mg | Freq: Two times a day (BID) | INTRAVENOUS | Status: DC
  Administered 2023-06-04 – 2023-06-05 (×3): 40 mg via INTRAVENOUS
  Filled 2023-06-04 (×3): qty 10

## 2023-06-04 MED ORDER — CHLORHEXIDINE GLUCONATE CLOTH 2 % EX PADS: 6.0000 | MEDICATED_PAD | Freq: Every day | CUTANEOUS | Status: DC

## 2023-06-04 NOTE — Consult Note (Signed)
 [facility] Vascular Consult Note  MRN : [id number]  Trevor Meyer is a 58 y.o. (08-15-1965) male who presents with chief complaint of  Chief Complaint  Patient presents with   Abnormal Lab  .  History of Present Illness: He was found to have polycystic kidneys on a CTA in 2022 for a Type B intramural hematoma when he presented with chest pain and developed AKI following the contrast injection.  He has never had the thoracic aorta re-imaged since.  He is being followed regularly by Nephrology and had an elevated potassium on the most recent labs and was told to come to the ER for recheck and admission if it was high.  He has no reported symptoms and feels well.  No CP.  He has a Investment banker, corporate in [place] next week and wants to wait on this to allow him to go on this vacation.  Nephrology called me this morning and asked me to get him scheduled for a dialysis catheter this week as they intended to start HD.  Current Facility-Administered Medications  Medication Dose Route Frequency Provider Last Rate Last Admin   acetaminophen (TYLENOL) tablet 650 mg  650 mg Oral Q6H PRN Bennie Brave, MD       Or   acetaminophen (TYLENOL) suppository 650 mg  650 mg Rectal Q6H PRN Bennie Brave, MD       amLODipine  (NORVASC ) tablet 10 mg  10 mg Oral Daily Goel, Hersh, MD   10 mg at 06/04/23 4098   cloNIDine  (CATAPRES ) tablet 0.1 mg  0.1 mg Oral BID Goel, Hersh, MD   0.1 mg at 06/04/23 0902   heparin  injection 5,000 Units  5,000 Units Subcutaneous Q8H Goel, Hersh, MD   5,000 Units at 06/03/23 2115   iron polysaccharides (NIFEREX) capsule 150 mg  150 mg Oral Daily Althia Atlas, MD   150 mg at 06/04/23 0901   isosorbide  mononitrate (IMDUR ) 24 hr tablet 30 mg  30 mg Oral Daily Goel, Hersh, MD   30 mg at 06/04/23 0902   labetalol  (NORMODYNE ) tablet 200 mg  200 mg Oral BID Goel, Hersh, MD   200 mg at 06/04/23 1191   omega-3 acid ethyl esters (LOVAZA) capsule 1 g  1 g Oral Daily Goel, Hersh, MD    1 g at 06/04/23 4782   pantoprazole  (PROTONIX ) injection 40 mg  40 mg Intravenous Q12H Althia Atlas, MD   40 mg at 06/04/23 0902   polyethylene glycol (MIRALAX  / GLYCOLAX ) packet 17 g  17 g Oral Daily PRN Bennie Brave, MD       sodium bicarbonate tablet 1,300 mg  1,300 mg Oral TID Althia Atlas, MD   1,300 mg at 06/04/23 9562   sodium chloride  flush (NS) 0.9 % injection 3 mL  3 mL Intravenous Q12H Goel, Hersh, MD   3 mL at 06/04/23 0902    Past Medical History:  Diagnosis Date   Chronic kidney disease 03/2020   Clotting disorder (HCC) 2000   Hypertension 03/2020    History reviewed. No pertinent surgical history.  Social History Social History   Tobacco Use   Smoking status: Never   Smokeless tobacco: Never  Vaping Use   Vaping status: Never Used  Substance Use Topics   Alcohol use: Never   Drug use: Never    Family History Family History  Problem Relation Age of Onset   Cancer Mother    Stroke Father     Allergies  Allergen Reactions  Hydralazine  Other (See Comments)    Flushing and tachycardia  Other reaction(s): Other (See Comments)  Flushing and tachycardia  Flushing and tachycardia   Hydralazine  Hcl Other (See Comments)    Flushing and tachycardia     REVIEW OF SYSTEMS (Negative unless checked)  Constitutional: [] Weight loss  [] Fever  [] Chills Cardiac: [] Chest pain   [] Chest pressure   [] Palpitations   [] Shortness of breath when laying flat   [] Shortness of breath at rest   [] Shortness of breath with exertion. Vascular:  [] Pain in legs with walking   [] Pain in legs at rest   [] Pain in legs when laying flat   [] Claudication   [] Pain in feet when walking  [] Pain in feet at rest  [] Pain in feet when laying flat   [] History of DVT   [] Phlebitis   [] Swelling in legs   [] Varicose veins   [] Non-healing ulcers Pulmonary:   [] Uses home oxygen   [] Productive cough   [] Hemoptysis   [] Wheeze  [] COPD   [] Asthma Neurologic:  [] Dizziness  [] Blackouts   [] Seizures    [] History of stroke   [] History of TIA  [] Aphasia   [] Temporary blindness   [] Dysphagia   [] Weakness or numbness in arms   [] Weakness or numbness in legs Musculoskeletal:  [] Arthritis   [] Joint swelling   [] Joint pain   [] Low back pain Hematologic:  [] Easy bruising  [] Easy bleeding   [] Hypercoagulable state   [] Anemic  [] Hepatitis Gastrointestinal:  [] Blood in stool   [] Vomiting blood  [] Gastroesophageal reflux/heartburn   [] Difficulty swallowing. Genitourinary:  [] Chronic kidney disease   [] Difficult urination  [] Frequent urination  [] Burning with urination   [] Blood in urine Skin:  [] Rashes   [] Ulcers   [] Wounds Psychological:  [] History of anxiety   []  History of major depression.  Physical Examination  Vitals:   06/03/23 2106 06/04/23 0350 06/04/23 0500 06/04/23 0902  BP: 136/81 137/76  (!) 150/83  Pulse: 76 73  78  Resp:  18    Temp: 98.1 F (36.7 C) 97.6 F (36.4 C)    TempSrc: Oral Oral    SpO2: 99% 99%    Weight:   69.4 kg   Height:       Body mass index is 23.96 kg/m. Gen:  WD/WN, NAD Head: [place]/AT, No temporalis wasting. Prominent temp pulse not noted. Ear/Nose/Throat: Hearing grossly intact, nares w/o erythema or drainage, oropharynx w/o Erythema/Exudate Eyes: Sclera non-icteric, conjunctiva clear Neck: Trachea midline.  No JVD.  Pulmonary:  Good air movement, respirations not labored, equal bilaterally.  Cardiac: RRR, normal S1, S2. Vascular: IV in both arms Vessel Right Left  Radial Palpable Palpable  Ulnar Palpable Palpable  Brachial Palpable Palpable  Carotid Palpable, without bruit Palpable, without bruit  Aorta Not palpable N/A  Femoral Palpable Palpable  Popliteal Palpable Palpable  PT Palpable Palpable  DP Palpable Palpable   Gastrointestinal: soft, non-tender/non-distended. No guarding/reflex.  Musculoskeletal: M/S 5/5 throughout.  Extremities without ischemic changes.  No deformity or atrophy. No edema. Neurologic: Sensation grossly intact in  extremities.  Symmetrical.  Speech is fluent. Motor exam as listed above. Psychiatric: Judgment intact, Mood & affect appropriate for pt's clinical situation. Dermatologic: No rashes or ulcers noted.  No cellulitis or open wounds. Lymph : No Cervical, Axillary, or Inguinal lymphadenopathy.     CBC Lab Results  Component Value Date   WBC 4.1 06/04/2023   HGB 8.1 (L) 06/04/2023   HCT 23.9 (L) 06/04/2023   MCV 94.1 06/04/2023   PLT 131 (L) 06/04/2023  BMET    Component Value Date/Time   NA 144 06/04/2023 0539   NA 144 06/01/2023 1119   K 5.0 06/04/2023 0539   CL 114 (H) 06/04/2023 0539   CO2 21 (L) 06/04/2023 0539   GLUCOSE 106 (H) 06/04/2023 0539   BUN 91 (H) 06/04/2023 0539   BUN 99 (HH) 06/01/2023 1119   CREATININE 8.53 (H) 06/04/2023 0539   CALCIUM 7.8 (L) 06/04/2023 0539   GFRNONAA 7 (L) 06/04/2023 0539   Estimated Creatinine Clearance: 8.8 mL/min (A) (by C-G formula based on SCr of 8.53 mg/dL (H)).  COAG Lab Results  Component Value Date   INR 1.1 06/03/2023   INR 1.0 06/02/2023   INR 1.1 03/22/2020    Radiology US  Renal Result Date: 06/02/2023 CLINICAL DATA:  Kidney failure EXAM: RENAL / URINARY TRACT ULTRASOUND COMPLETE COMPARISON:  CTA 03/22/2020. FINDINGS: Right Kidney: Renal measurements: 16.6 x 9.8 x 9.3 cm = volume: 72.7 mL. Parenchymal atrophy with extensive cystic changes seen throughout the renal parenchyma. Dominant foci measure up to 4.8 cm and 4.0 cm but there are numerable foci identified. These were seen on previous CTA as well. Please correlate with clinical history. No obvious collecting system dilatation. Left Kidney: Renal measurements: 14.0 x 9.2 x 10.2 cm = volume: 685.0 mL. Parenchymal atrophy with once again extensive cystic foci throughout the renal parenchyma. Again these measure up to 5 cm and 3.9 cm. Numerous smaller foci identified as well. No separate collecting system dilatation clearly seen. Bladder: Slight wall thickening of the  urinary bladder. Other: None. IMPRESSION: Innumerable bilateral renal cystic foci. Vast majority appears simple. No separate collecting system dilatation. Electronically Signed   By: Adrianna Horde M.D.   On: 06/02/2023 17:42      Assessment/Plan 1. Stage 5 CKD not on HD with an elevated potassium.  Repeat labs show it is down to 5.0. 2. Excellent arterial exam and likely will have good veins.  No IV in left arm for future fistula. 3. [facility] is available to place a tunneled catheter this week if Nephrology requests after discussing with the patient.   Jerri Morale, MD  06/04/2023 2:16 PM

## 2023-06-04 NOTE — Plan of Care (Signed)

## 2023-06-04 NOTE — Progress Notes (Addendum)
 [facility]  ROUNDING NOTE   Subjective:  Patient seen in bed resting, on room air. Family at bedside. Counseled patient on need to start with renal replacement therapy tomorrow after access placement. Patient appears upset, concern with up coming travel to [place] this weekend that has been 2 years of planning. He had planned for 10-day trip with family.  Discussed risks of out of country hospitalization if kidney injury is not prioritized.    Objective:  Vital signs in last 24 hours:  Temp:  [97.6 F (36.4 C)-98.9 F (37.2 C)] 97.6 F (36.4 C) (05/25 0350) Pulse Rate:  [71-78] 78 (05/25 0902) Resp:  [17-18] 18 (05/25 0350) BP: (121-150)/(71-83) 150/83 (05/25 0902) SpO2:  [98 %-100 %] 99 % (05/25 0350) Weight:  [69.4 kg] 69.4 kg (05/25 0500)  Weight change: 4.536 kg Filed Weights   06/02/23 1241 06/04/23 0500  Weight: 64.9 kg 69.4 kg    Intake/Output: I/O last 3 completed shifts: In: 710 [P.O.:320; Blood:390] Out: 1600 [Urine:1600]   Intake/Output this shift:  No intake/output data recorded.  Physical Exam: General: Upset  Head: Normocephalic, atraumatic. Moist oral mucosal membranes  Eyes: Anicteric, PERRL  Neck: Supple, trachea midline  Lungs:  Room air, clear to auscultation  Heart: Regular rate and rhythm  Abdomen:  Soft, nontender,   Extremities:  No peripheral edema.  Neurologic: Nonfocal, moving all four extremities  Skin: No lesions  Access: none    Basic Metabolic Panel: Recent Labs  Lab 06/01/23 1119 06/02/23 1243 06/02/23 1743 06/03/23 0512 06/04/23 0539  NA 144 140  --  138 144  K 6.3* 6.1* 5.5* 5.5* 5.0  CL 113* 111  --  112* 114*  CO2 15* 17*  --  16* 21*  GLUCOSE 95 116*  --  108* 106*  BUN 99* 97*  --  96* 91*  CREATININE 8.68* 8.39*  --  8.45* 8.53*  CALCIUM 8.6* 8.7*  --  7.7* 7.8*  MG  --   --   --  3.3* 3.1*  PHOS  --   --   --  5.2* 5.6*    Liver Function Tests: Recent Labs  Lab 06/01/23 1119 06/02/23 1243  AST  11 13*  ALT 11 14  ALKPHOS 53 44  BILITOT <0.2 0.7  PROT 6.7 7.0  ALBUMIN 4.3 3.6   No results for input(s): "LIPASE", "AMYLASE" in the last 168 hours. No results for input(s): "AMMONIA" in the last 168 hours.  CBC: Recent Labs  Lab 06/01/23 1119 06/02/23 1243 06/03/23 0512 06/03/23 0916 06/03/23 1713 06/04/23 0539  WBC 4.3 4.3 3.6*  --  3.7* 4.1  NEUTROABS 2.6  --   --   --   --   --   HGB 7.9* 7.9* 6.7* 7.0* 7.2* 8.1*  HCT 24.6* 26.1* 20.9* 22.3* 22.6* 23.9*  MCV 94 98.5 95.0  --  93.8 94.1  PLT 173 153 132*  --  124* 131*    Cardiac Enzymes: No results for input(s): "CKTOTAL", "CKMB", "CKMBINDEX", "TROPONINI" in the last 168 hours.  BNP: Invalid input(s): "POCBNP"  CBG: No results for input(s): "GLUCAP" in the last 168 hours.  Microbiology: Results for orders placed or performed during the hospital encounter of 03/21/20  Resp Panel by RT-PCR (Flu A&B, Covid) Nasopharyngeal Swab     Status: None   Collection Time: 03/21/20 11:32 PM   Specimen: Nasopharyngeal Swab; Nasopharyngeal(NP) swabs in vial transport medium  Result Value Ref Range Status   SARS Coronavirus 2 by  RT PCR NEGATIVE NEGATIVE Final    Comment: (NOTE) SARS-CoV-2 target nucleic acids are NOT DETECTED.  The SARS-CoV-2 RNA is generally detectable in upper respiratory specimens during the acute phase of infection. The lowest concentration of SARS-CoV-2 viral copies this assay can detect is 138 copies/mL. A negative result does not preclude SARS-Cov-2 infection and should not be used as the sole basis for treatment or other patient management decisions. A negative result may occur with  improper specimen collection/handling, submission of specimen other than nasopharyngeal swab, presence of viral mutation(s) within the areas targeted by this assay, and inadequate number of viral copies(<138 copies/mL). A negative result must be combined with clinical observations, patient history, and  epidemiological information. The expected result is Negative.  Fact Sheet for Patients:  BloggerCourse.com  Fact Sheet for Healthcare Providers:  SeriousBroker.it  This test is no t yet approved or cleared by the [place]  FDA and  has been authorized for detection and/or diagnosis of SARS-CoV-2 by FDA under an Emergency Use Authorization (EUA). This EUA will remain  in effect (meaning this test can be used) for the duration of the COVID-19 declaration under Section 564(b)(1) of the Act, 21 U.S.C.section 360bbb-3(b)(1), unless the authorization is terminated  or revoked sooner.       Influenza A by PCR NEGATIVE NEGATIVE Final   Influenza B by PCR NEGATIVE NEGATIVE Final    Comment: (NOTE) The Xpert Xpress SARS-CoV-2/FLU/RSV plus assay is intended as an aid in the diagnosis of influenza from Nasopharyngeal swab specimens and should not be used as a sole basis for treatment. Nasal washings and aspirates are unacceptable for Xpert Xpress SARS-CoV-2/FLU/RSV testing.  Fact Sheet for Patients: BloggerCourse.com  Fact Sheet for Healthcare Providers: SeriousBroker.it  This test is not yet approved or cleared by the [place]  FDA and has been authorized for detection and/or diagnosis of SARS-CoV-2 by FDA under an Emergency Use Authorization (EUA). This EUA will remain in effect (meaning this test can be used) for the duration of the COVID-19 declaration under Section 564(b)(1) of the Act, 21 U.S.C. section 360bbb-3(b)(1), unless the authorization is terminated or revoked.  Performed at [facility], [address] [place], [place] [address]   MRSA PCR Screening     Status: None   Collection Time: 03/22/20  6:18 AM   Specimen: Nasopharyngeal  Result Value Ref Range Status   MRSA by PCR NEGATIVE NEGATIVE Final    Comment:        The GeneXpert MRSA Assay  (FDA approved for NASAL specimens only), is one component of a comprehensive MRSA colonization surveillance program. It is not intended to diagnose MRSA infection nor to guide or monitor treatment for MRSA infections. Performed at [facility], [address] [place], [place] [address]   Urine Culture     Status: Abnormal   Collection Time: 03/23/20 11:44 PM   Specimen: Urine, Clean Catch  Result Value Ref Range Status   Specimen Description   Final    URINE, CLEAN CATCH Performed at [facility], [address] [place], [place] [address]    Special Requests   Final    NONE Performed at [facility], [address] [place], [place] [address]    Culture (A)  Final    10,000 COLONIES/mL GROUP B STREP(S.AGALACTIAE)ISOLATED TESTING AGAINST S. AGALACTIAE NOT ROUTINELY PERFORMED DUE TO PREDICTABILITY OF AMP/PEN/VAN SUSCEPTIBILITY. Performed at [facility], [address], [place], [place] [address]    Report Status 03/25/2020  FINAL  Final    Coagulation Studies: Recent Labs    06/02/23 1243 06/03/23 0512  LABPROT 13.7 14.7  INR 1.0 1.1    Urinalysis: Recent Labs    06/03/23 1055  COLORURINE COLORLESS*  LABSPEC 1.009  PHURINE 6.0  GLUCOSEU 150*  HGBUR NEGATIVE  BILIRUBINUR NEGATIVE  KETONESUR NEGATIVE  PROTEINUR NEGATIVE  NITRITE NEGATIVE  LEUKOCYTESUR NEGATIVE      Imaging: US  Renal Result Date: 06/02/2023 CLINICAL DATA:  Kidney failure EXAM: RENAL / URINARY TRACT ULTRASOUND COMPLETE COMPARISON:  CTA 03/22/2020. FINDINGS: Right Kidney: Renal measurements: 16.6 x 9.8 x 9.3 cm = volume: 72.7 mL. Parenchymal atrophy with extensive cystic changes seen throughout the renal parenchyma. Dominant foci measure up to 4.8 cm and 4.0 cm but there are numerable foci identified. These were seen on previous CTA as well. Please correlate with clinical history. No obvious collecting system dilatation. Left Kidney: Renal measurements: 14.0 x  9.2 x 10.2 cm = volume: 685.0 mL. Parenchymal atrophy with once again extensive cystic foci throughout the renal parenchyma. Again these measure up to 5 cm and 3.9 cm. Numerous smaller foci identified as well. No separate collecting system dilatation clearly seen. Bladder: Slight wall thickening of the urinary bladder. Other: None. IMPRESSION: Innumerable bilateral renal cystic foci. Vast majority appears simple. No separate collecting system dilatation. Electronically Signed   By: Adrianna Horde M.D.   On: 06/02/2023 17:42     Medications:     amLODipine   10 mg Oral Daily   cloNIDine   0.1 mg Oral BID   heparin   5,000 Units Subcutaneous Q8H   iron polysaccharides  150 mg Oral Daily   isosorbide  mononitrate  30 mg Oral Daily   labetalol   200 mg Oral BID   omega-3 acid ethyl esters  1 g Oral Daily   pantoprazole  (PROTONIX ) IV  40 mg Intravenous Q12H   sodium bicarbonate  1,300 mg Oral TID   sodium chloride  flush  3 mL Intravenous Q12H   acetaminophen **OR** acetaminophen, polyethylene glycol  Assessment/ Plan:  Trevor Meyer is a 58 y.o.  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 Is know to our practice and was last seen by Dr. Zelda Hickman 2 years ago. Abnormal labs noted by PCP after visit for elevated creatinine levels and was told to come to ED. Patient was found to have acute kidney injury with electrolyte derangements.   Acute Kidney Injury on Chronic Kidney Disease of unknown stage. History of AKI due to IV contrast in 2022 Baseline creatinine at 2 and is now elevated. Patient has not been to Nephology office in 2 years. Discussed with patient possibility of starting renal replacement therapy due to continued elevated creatinine levels. Considering options for AKI medication management for patient during travel, as patient is not symptomatic. Patient counseled on temp access placement for tomorrow  however, per vascular notes, patient wants to wait til after return from vacation. Continue judicious use of fluids, avoid renal toxic agents, provide appropriate renal dosage of medications. Continue strict intake and output. Daily BMP    Lab Results  Component Value Date   CREATININE 8.53 (H) 06/04/2023   CREATININE 8.45 (H) 06/03/2023   CREATININE 8.39 (H) 06/02/2023       Intake/Output Summary (Last 24 hours) at 06/04/2023 1446 Last data filed at 06/04/2023 0352 Gross per 24 hour  Intake 710 ml  Output 1350 ml  Net -640 ml  2. Metabolic acidosis  06/03/2023 Co2 16>21,improving -Continue Bicarbonate PO  3. Hypertension  Currently on amlodipine , clonidine , isosorbide , and labetalol . BP 150/83 today   4. Anemia of chronic disease   Latest Reference Range & Units Most Recent 06/03/23 17:13 06/04/23 05:39  Hemoglobin 13.0 - 17.0 g/dL 8.1 (L) 1/61/09 60:45 7.2 (L) 8.1 (L)  (L): Data is abnormally low Patient has received a transfusion this admission. Daily CBC, will consider ESA with renal replacement therapy   5. Secondary Hyperparathyroidism 06/04/2023 PTH 28 06/04/2023 Phos 5.6 06/04/2023 Ca 7.8 Will order Calcium Acetate 1tab PO TID with meals. Will monitor bone mineral levels this admission.    5. Hyperkalemia (resolved) 06/04/2023 potassium 5.0 Daily Lokelma by primary team   LOS: 2 Envi Eagleson Marisa Sickles 5/25/20252:46 PM

## 2023-06-04 NOTE — Progress Notes (Signed)
 [facility] Progress Note  Patient: Trevor Meyer    WJX:[id number]  DOA: 06/02/2023     Date of Service: the patient was seen and examined on 06/04/2023  Chief Complaint  Patient presents with   Abnormal Lab   Brief hospital course: Trevor Meyer is a 58 y.o. male with PMH of HTN, CKD with baseline creatinine around 2, polycystic kidney disease, 1. Acute aortic syndrome - intramural hematoma extending from the left subclavian artery origin to the diaphragmatic hiatus, as reviewed per EMR, patient presented at [facility] ED due to abnormal labs. As per patient he did not have any symptoms and went to PCP for routine blood work which showed elevated creatinine and he was advised to go to the ED.  Patient denied any complaint in the ED.  ED workup: VS afebrile, HR 91, RR 18, BP 160/98, 100% on room air BMP potassium 6.3, bicarb 15, creatinine 8.68, calcium 8.6 LDL 153, total cholesterol 214 CBC hemoglobin 7.9 anemia  ED physician notified nephrology and [facility] was given due to hyperkalemia.  [facility] was consulted for admission and further management as below.  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 Vast majority appears simple. No separate collecting system dilatation. 5/24 Bladder scan 280 mL and patient voided 250 mL after bladder scan Monitor renal functions and urine output Avoid nephrotoxic medications Use renally dose medications Nephrology following, patient may need hemodialysis, vascular surgery consulted   # Hyperkalemia due to AKI/CKD 5, Resolved  S/p Lokelma  Monitor potassium level daily Continue to monitor on telemetry  # Metabolic acidosis due to CKD, Resolved  S/p oral bicarbonate Monitor BMP daily  # Anemia secondary to  CKD and iron deficiency anemia 5/24 Hemoglobin 7.0, Transfuse 1 unit of PRBC Monitor H&H  5/25 c/o black stools, no active GI bleeding Hb 8.1 Started PPI 40 mg IV twice daily Check FOBT Repeat H&H Consider GI consult if active bleeding or hemoglobin drops   # Iron deficiency, transferrin saturation 13% Started oral iron supplement Folate and B12 within normal range  # Resistant hypertension Continued amlodipine  10 mg, clonidine  0.1 mg p.o. twice daily, Imdur  30 mg p.o. daily, labetalol  200 mg p.o. twice daily Monitor BP and titrate medication accordingly   Body mass index is 23.96 kg/m.  Interventions:  Diet: Renal diet, low potassium DVT Prophylaxis: Subcutaneous Heparin     Advance goals of care discussion: Full code  Family Communication: family was present at bedside, at the time of interview.  The pt provided permission to discuss medical plan with the family. Opportunity was given to ask question and all questions were answered satisfactorily.   Disposition:  Pt is from Home, admitted with acute renal failure, still has elevated creatinine, which precludes a safe discharge. Discharge to home, when stable and cleared by nephrology.  May need few days to improve.  Subjective: No significant events overnight, patient was complaining of dark stools, no any other complaints.  No abdominal pain without nausea vomiting.  Denied any chest pain or palpitation, no shortness of breath No prior history of GI bleeding   Physical Exam: General: NAD, lying comfortably Appear in no distress, affect appropriate Eyes: PERRLA ENT: Oral Mucosa Clear, moist  Neck: no JVD,  Cardiovascular: S1 and S2  Present, no Murmur,  Respiratory: good respiratory effort, Bilateral Air entry equal and Decreased, no Crackles, no wheezes Abdomen: Bowel Sound present, Soft and no tenderness,  Skin: no rashes Extremities: no Pedal edema, no calf tenderness Neurologic: without any new focal  findings Gait not checked due to patient safety concerns  Vitals:   06/03/23 2106 06/04/23 0350 06/04/23 0500 06/04/23 0902  BP: 136/81 137/76  (!) 150/83  Pulse: 76 73  78  Resp:  18    Temp: 98.1 F (36.7 C) 97.6 F (36.4 C)    TempSrc: Oral Oral    SpO2: 99% 99%    Weight:   69.4 kg   Height:        Intake/Output Summary (Last 24 hours) at 06/04/2023 1428 Last data filed at 06/04/2023 0352 Gross per 24 hour  Intake 710 ml  Output 1350 ml  Net -640 ml   Filed Weights   06/02/23 1241 06/04/23 0500  Weight: 64.9 kg 69.4 kg    Data Reviewed: I have personally reviewed and interpreted daily labs, tele strips, imagings as discussed above. I reviewed all nursing notes, pharmacy notes, vitals, pertinent old records I have discussed plan of care as described above with RN and patient/family.  CBC: Recent Labs  Lab 06/01/23 1119 06/02/23 1243 06/03/23 0512 06/03/23 0916 06/03/23 1713 06/04/23 0539  WBC 4.3 4.3 3.6*  --  3.7* 4.1  NEUTROABS 2.6  --   --   --   --   --   HGB 7.9* 7.9* 6.7* 7.0* 7.2* 8.1*  HCT 24.6* 26.1* 20.9* 22.3* 22.6* 23.9*  MCV 94 98.5 95.0  --  93.8 94.1  PLT 173 153 132*  --  124* 131*   Basic Metabolic Panel: Recent Labs  Lab 06/01/23 1119 06/02/23 1243 06/02/23 1743 06/03/23 0512 06/04/23 0539  NA 144 140  --  138 144  K 6.3* 6.1* 5.5* 5.5* 5.0  CL 113* 111  --  112* 114*  CO2 15* 17*  --  16* 21*  GLUCOSE 95 116*  --  108* 106*  BUN 99* 97*  --  96* 91*  CREATININE 8.68* 8.39*  --  8.45* 8.53*  CALCIUM 8.6* 8.7*  --  7.7* 7.8*  MG  --   --   --  3.3* 3.1*  PHOS  --   --   --  5.2* 5.6*    Studies: No results found.   Scheduled Meds:  amLODipine   10 mg Oral Daily   cloNIDine   0.1 mg Oral BID   heparin   5,000 Units Subcutaneous Q8H   iron polysaccharides  150 mg Oral Daily   isosorbide  mononitrate  30 mg Oral Daily   labetalol   200 mg Oral BID   omega-3 acid ethyl esters  1 g Oral Daily   pantoprazole  (PROTONIX ) IV  40 mg  Intravenous Q12H   sodium bicarbonate  1,300 mg Oral TID   sodium chloride  flush  3 mL Intravenous Q12H   Continuous Infusions: PRN Meds: acetaminophen **OR** acetaminophen, polyethylene glycol  Time spent: 55 minutes  Author: Althia Atlas. MD [facility] 06/04/2023 2:28 PM  To reach On-call, see care teams to locate the attending and reach out to them via [email or web address]. If 7PM-7AM, please contact night-coverage If you still have difficulty reaching the attending provider, please page the [facility] (Director on Call) for [facility] on amion for assistance.

## 2023-06-04 NOTE — Plan of Care (Signed)

## 2023-06-05 DIAGNOSIS — N186 End stage renal disease: Secondary | ICD-10-CM | POA: Diagnosis not present

## 2023-06-05 LAB — BASIC METABOLIC PANEL WITH GFR
Anion gap: 9 (ref 5–15)
BUN: 95 mg/dL — ABNORMAL HIGH (ref 6–20)
CO2: 20 mmol/L — ABNORMAL LOW (ref 22–32)
Calcium: 7.6 mg/dL — ABNORMAL LOW (ref 8.9–10.3)
Chloride: 113 mmol/L — ABNORMAL HIGH (ref 98–111)
Creatinine, Ser: 8.45 mg/dL — ABNORMAL HIGH (ref 0.61–1.24)
GFR, Estimated: 7 mL/min — ABNORMAL LOW (ref >60)
Glucose, Bld: 93 mg/dL (ref 70–99)
Potassium: 4.9 mmol/L (ref 3.5–5.1)
Sodium: 142 mmol/L (ref 135–145)

## 2023-06-05 LAB — CBC
HCT: 25.1 % — ABNORMAL LOW (ref 39.0–52.0)
Hemoglobin: 8.3 g/dL — ABNORMAL LOW (ref 13.0–17.0)
MCH: 30.4 pg (ref 26.0–34.0)
MCHC: 33.1 g/dL (ref 30.0–36.0)
MCV: 91.9 fL (ref 80.0–100.0)
Platelets: 147 10*3/uL — ABNORMAL LOW (ref 150–400)
RBC: 2.73 MIL/uL — ABNORMAL LOW (ref 4.22–5.81)
RDW: 12.1 % (ref 11.5–15.5)
WBC: 4.3 10*3/uL (ref 4.0–10.5)
nRBC: 0 % (ref 0.0–0.2)

## 2023-06-05 LAB — HEPATITIS B SURFACE ANTIGEN: Hepatitis B Surface Ag: NONREACTIVE

## 2023-06-05 LAB — PHOSPHORUS: Phosphorus: 5.5 mg/dL — ABNORMAL HIGH (ref 2.5–4.6)

## 2023-06-05 LAB — MAGNESIUM: Magnesium: 2.9 mg/dL — ABNORMAL HIGH (ref 1.7–2.4)

## 2023-06-05 LAB — OCCULT BLOOD X 1 CARD TO LAB, STOOL: Fecal Occult Bld: NEGATIVE

## 2023-06-05 MED ORDER — SODIUM BICARBONATE 650 MG PO TABS: 650.0000 mg | ORAL_TABLET | Freq: Three times a day (TID) | ORAL | 2 refills | Status: DC

## 2023-06-05 MED ORDER — PANTOPRAZOLE SODIUM 40 MG PO TBEC: 40.0000 mg | DELAYED_RELEASE_TABLET | Freq: Every day | ORAL | 2 refills | Status: DC

## 2023-06-05 MED ORDER — FUROSEMIDE 40 MG PO TABS: 40.0000 mg | ORAL_TABLET | Freq: Every day | ORAL | 2 refills | Status: DC

## 2023-06-05 MED ORDER — PANTOPRAZOLE SODIUM 40 MG PO TBEC: 40.0000 mg | DELAYED_RELEASE_TABLET | Freq: Every day | ORAL | Status: DC

## 2023-06-05 MED ORDER — POLYSACCHARIDE IRON COMPLEX 150 MG PO CAPS: 150.0000 mg | ORAL_CAPSULE | Freq: Every day | ORAL | 2 refills | Status: DC

## 2023-06-05 MED ORDER — CALCIUM ACETATE (PHOS BINDER) 667 MG PO CAPS: 667.0000 mg | ORAL_CAPSULE | Freq: Three times a day (TID) | ORAL | 2 refills | Status: DC

## 2023-06-05 NOTE — Discharge Summary (Signed)
 [facility] Discharge Summary   Patient: Trevor Meyer ZOX:[id number]  PCP: Trevor Rancher, PA  Date of admission: 06/02/2023   Date of discharge:  06/05/2023     Discharge Diagnoses:  Principal Problem:   ESRD (end stage renal disease) (HCC) Active Problems:   Resistant hypertension   Anemia   Admitted From: Home Disposition:  Home   Recommendations for Outpatient Follow-up:  PCP: in 1 wk Nephro in 2-3 days for labs Follow up LABS/TEST:     Follow-up Information     Trevor Rancher, PA Follow up in 1 week(s).   Specialty: Physician Assistant Contact information: [address] [place] [address] [phone]         Trevor Junior, MD Follow up in 2 day(s).   Specialty: Nephrology Contact information: [address] [place] [address] [phone]                Diet recommendation: Renal diet  Activity: The patient is advised to gradually reintroduce usual activities, as tolerated  Discharge Condition: stable  Code Status: Full code   History of present illness: As per the H and P dictated on admission. Hospital Course:  Trevor Meyer is a 58 y.o. male with PMH of HTN, CKD with baseline creatinine around 2, polycystic kidney disease, 1. Acute aortic syndrome - intramural hematoma extending from the left subclavian artery origin to the diaphragmatic hiatus, as reviewed per EMR, patient presented at [facility] ED due to abnormal labs. As per patient he did not have any symptoms and went to PCP for routine blood work which showed elevated creatinine and he was advised to go to the ED. He denied any complaint in the ED.   ED workup: VS afebrile, HR 91, RR 18, BP 160/98, 100% on room air BMP potassium 6.3, bicarb 15, creatinine 8.68, calcium 8.6 LDL 153, total cholesterol 214 CBC hemoglobin 7.9 anemia   ED physician notified nephrology and [facility] was given due to hyperkalemia.  [facility] was consulted for admission and  further management as below.   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 Vast majority appears simple. No separate collecting system dilatation. 5/24 Bladder scan 280 mL and patient voided 250 mL after bladder scan Nephrology was consulted.  Patient needed hemodialysis but it was not urgent, vascular surgery was consulted for HD catheter insertion.   Patient does not wanted hemodialysis catheter at this time because he has to go on a trip to [place] which he was planning for a long time. Patient was cleared by nephrology to discharge on Lasix 40 mg p.o. daily, PhosLo 3 times daily.  Follow-up in 2 to 3 days to repeat labs before trip to [place].   # Hyperkalemia due to AKI/CKD 5, Resolved  S/p Lokelma.  Repeat labs in 2 to 3 days and patient may need Lokelma as an outpatient, nephrology will do the prescription.   # Metabolic acidosis due to CKD, Resolved  S/p oral bicarbonate.  Discharged on oral bicarbonate. # Anemia secondary to CKD and iron deficiency anemia 5/24 Hemoglobin 7.0, Transfuse 1 unit of PRBC.  Continue oral iron supplement and repeat CBC in few days   5/25 c/o black stools, no active GI bleeding  Hb 8.3 remained stable, no bright red bleeding.  Still has some black stool.  S/p PPI IV, started pantoprazole  40 mg p.o. daily.  Patient was advised to return back to ED if any signs of bleeding.  # Iron  deficiency, transferrin saturation 13%, Started oral iron  supplement Folate and B12 within normal range   # Resistant hypertension: BP remained stable during hospital stay Continued amlodipine  10 mg, clonidine  0.1 mg p.o. twice daily, Imdur  30 mg p.o. daily, labetalol  200 mg p.o. twice daily. Discontinued irbesartan  due to hyperkalemia,  start on Lasix  as above. Patient was advised to monitor BP at home and follow with nephrology and PCP to titrate medications accordingly.   Body mass index is 23.38 kg/m.  Nutrition Interventions:  Patient was ambulatory without any assistance. On the day of the discharge the patient's vitals were stable, and no other acute medical condition were reported by patient. the patient was felt safe to be discharge at Home.  Consultants: Nephrology and vascular surgery Procedures: None  Discharge Exam: General: Appear in no distress, no Rash; Oral Mucosa Clear, moist. Cardiovascular: S1 and S2 Present, no Murmur, Respiratory: normal respiratory effort, Bilateral Air entry present and no Crackles, no wheezes Abdomen: Bowel Sound present, Soft and no tenderness, no hernia Extremities: no Pedal edema, no calf tenderness Neurology: alert and oriented to time, place, and person affect appropriate.  Filed Weights   06/02/23 1241 06/04/23 0500 06/05/23 0500  Weight: 64.9 kg 69.4 kg 67.7 kg   Vitals:   06/05/23 0510 06/05/23 0859  BP: (!) 158/93 (!) 155/87  Pulse: 71 70  Resp: 17 18  Temp: 98 F (36.7 C) 98.4 F (36.9 C)  SpO2: 98% 100%    DISCHARGE MEDICATION: Allergies as of 06/05/2023       Reactions   Hydralazine  Other (See Comments)   Flushing and tachycardia Other reaction(s): Other (See Comments)  Flushing and tachycardia  Flushing and tachycardia   Hydralazine  Hcl Other (See Comments)   Flushing and tachycardia        Medication List     STOP taking these medications    irbesartan  75 MG tablet Commonly known as: AVAPRO        TAKE these medications    amLODipine  10 MG tablet Commonly known as: NORVASC  Take 1 tablet (10 mg total) by mouth daily.   calcium  acetate 667 MG capsule Commonly known as: PHOSLO  Take 1 capsule (667 mg total) by mouth 3 (three) times daily with meals.   cloNIDine  0.1 MG tablet Commonly known as: CATAPRES  Take 1 tablet (0.1 mg  total) by mouth 2 (two) times daily.   Fish Oil 1000 MG Caps Take by mouth.   furosemide  40 MG tablet Commonly known as: Lasix  Take 1 tablet (40 mg total) by mouth daily.   iron  polysaccharides 150 MG capsule Commonly known as: NIFEREX Take 1 capsule (150 mg total) by mouth daily. Start taking on: Jun 09, 2023   isosorbide  mononitrate 30 MG 24 hr tablet Commonly known as: IMDUR  Take 1 tablet (30 mg total) by mouth daily.   labetalol  200 MG tablet Commonly known as: NORMODYNE  Take 1 tablet (200 mg total) by mouth 2 (two) times daily.   pantoprazole  40 MG tablet Commonly known as: PROTONIX  Take 1 tablet (40 mg total) by mouth daily. Start taking on: Jun 06, 2023   PROSTATE HEALTH PO Take by mouth.   sodium bicarbonate  650 MG tablet Take 1 tablet (650 mg total) by mouth 3 (three) times daily.  Allergies  Allergen Reactions   Hydralazine  Other (See Comments)    Flushing and tachycardia  Other reaction(s): Other (See Comments)  Flushing and tachycardia  Flushing and tachycardia   Hydralazine  Hcl Other (See Comments)    Flushing and tachycardia   Discharge Instructions     Call MD for:   Complete by: As directed    GI bleeding, palpations   Call MD for:  difficulty breathing, headache or visual disturbances   Complete by: As directed    Call MD for:  extreme fatigue   Complete by: As directed    Call MD for:  persistant dizziness or light-headedness   Complete by: As directed    Call MD for:  persistant nausea and vomiting   Complete by: As directed    Call MD for:  severe uncontrolled pain   Complete by: As directed    Call MD for:  temperature >100.4   Complete by: As directed    Diet - low sodium heart healthy   Complete by: As directed    Low potassium Diet   Discharge instructions   Complete by: As directed    F/u PCP in 1 wk F/u Nephro in few days for Labs   Increase activity slowly   Complete by: As directed        The results of  significant diagnostics from this hospitalization (including imaging, microbiology, ancillary and laboratory) are listed below for reference.    Significant Diagnostic Studies: US  Renal Result Date: 06/02/2023 CLINICAL DATA:  Kidney failure EXAM: RENAL / URINARY TRACT ULTRASOUND COMPLETE COMPARISON:  CTA 03/22/2020. FINDINGS: Right Kidney: Renal measurements: 16.6 x 9.8 x 9.3 cm = volume: 72.7 mL. Parenchymal atrophy with extensive cystic changes seen throughout the renal parenchyma. Dominant foci measure up to 4.8 cm and 4.0 cm but there are numerable foci identified. These were seen on previous CTA as well. Please correlate with clinical history. No obvious collecting system dilatation. Left Kidney: Renal measurements: 14.0 x 9.2 x 10.2 cm = volume: 685.0 mL. Parenchymal atrophy with once again extensive cystic foci throughout the renal parenchyma. Again these measure up to 5 cm and 3.9 cm. Numerous smaller foci identified as well. No separate collecting system dilatation clearly seen. Bladder: Slight wall thickening of the urinary bladder. Other: None. IMPRESSION: Innumerable bilateral renal cystic foci. Vast majority appears simple. No separate collecting system dilatation. Electronically Signed   By: Adrianna Horde M.D.   On: 06/02/2023 17:42    Microbiology: No results found for this or any previous visit (from the past 240 hours).   Labs: CBC: Recent Labs  Lab 06/01/23 1119 06/02/23 1243 06/02/23 1243 06/03/23 0512 06/03/23 0916 06/03/23 1713 06/04/23 0539 06/04/23 1656 06/05/23 0543  WBC 4.3 4.3  --  3.6*  --  3.7* 4.1  --  4.3  NEUTROABS 2.6  --   --   --   --   --   --   --   --   HGB 7.9* 7.9*   < > 6.7* 7.0* 7.2* 8.1* 7.8* 8.3*  HCT 24.6* 26.1*   < > 20.9* 22.3* 22.6* 23.9* 24.0* 25.1*  MCV 94 98.5  --  95.0  --  93.8 94.1  --  91.9  PLT 173 153  --  132*  --  124* 131*  --  147*   < > = values in this interval not displayed.   Basic Metabolic Panel: Recent Labs  Lab  06/01/23 1119 06/02/23 1243 06/02/23 1743  06/03/23 0512 06/04/23 0539 06/05/23 0543  NA 144 140  --  138 144 142  K 6.3* 6.1* 5.5* 5.5* 5.0 4.9  CL 113* 111  --  112* 114* 113*  CO2 15* 17*  --  16* 21* 20*  GLUCOSE 95 116*  --  108* 106* 93  BUN 99* 97*  --  96* 91* 95*  CREATININE 8.68* 8.39*  --  8.45* 8.53* 8.45*  CALCIUM 8.6* 8.7*  --  7.7* 7.8* 7.6*  MG  --   --   --  3.3* 3.1* 2.9*  PHOS  --   --   --  5.2* 5.6* 5.5*   Liver Function Tests: Recent Labs  Lab 06/01/23 1119 06/02/23 1243  AST 11 13*  ALT 11 14  ALKPHOS 53 44  BILITOT <0.2 0.7  PROT 6.7 7.0  ALBUMIN 4.3 3.6   No results for input(s): "LIPASE", "AMYLASE" in the last 168 hours. No results for input(s): "AMMONIA" in the last 168 hours. Cardiac Enzymes: No results for input(s): "CKTOTAL", "CKMB", "CKMBINDEX", "TROPONINI" in the last 168 hours. BNP (last 3 results) No results for input(s): "BNP" in the last 8760 hours. CBG: No results for input(s): "GLUCAP" in the last 168 hours.  Time spent: 35 minutes  Signed:  Althia Atlas  [facility]  06/05/2023 12:22 PM

## 2023-06-05 NOTE — Progress Notes (Signed)
 [facility]  ROUNDING NOTE   Subjective:   Wife at bedside. Patient states he wants to go on his trip to [place] and then follow up for possibly starting renal replacement therapy afterwards.   Patient was last seen by Nephrology as an outpatient on 02/18/2021.   Patient's creatinine 8.45, GFR of 7.  Potassium 4.9. Bicarbonate 20. Anion gap is 9.   Patient has no complaints.    Objective:  Vital signs in last 24 hours:  Temp:  [98 F (36.7 C)-98.5 F (36.9 C)] 98.4 F (36.9 C) (05/26 0859) Pulse Rate:  [70-81] 70 (05/26 0859) Resp:  [16-18] 18 (05/26 0859) BP: (128-158)/(81-93) 155/87 (05/26 0859) SpO2:  [98 %-100 %] 100 % (05/26 0859) Weight:  [67.7 kg] 67.7 kg (05/26 0500)  Weight change: -1.7 kg Filed Weights   06/02/23 1241 06/04/23 0500 06/05/23 0500  Weight: 64.9 kg 69.4 kg 67.7 kg    Intake/Output: I/O last 3 completed shifts: In: 440 [P.O.:440] Out: 3950 [Urine:3950]   Intake/Output this shift:  No intake/output data recorded.  Physical Exam: General: Laying in bed  Head: Normocephalic, atraumatic. Moist oral mucosal membranes  Eyes: Anicteric, PERRL  Neck: Supple, trachea midline  Lungs:  Room air, clear to auscultation  Heart: Regular rate and rhythm  Abdomen:  Soft, nontender,   Extremities:  No peripheral edema.  Neurologic: Nonfocal, moving all four extremities  Skin: No lesions  Access: none    Basic Metabolic Panel: Recent Labs  Lab 06/01/23 1119 06/02/23 1243 06/02/23 1743 06/03/23 0512 06/04/23 0539 06/05/23 0543  NA 144 140  --  138 144 142  K 6.3* 6.1* 5.5* 5.5* 5.0 4.9  CL 113* 111  --  112* 114* 113*  CO2 15* 17*  --  16* 21* 20*  GLUCOSE 95 116*  --  108* 106* 93  BUN 99* 97*  --  96* 91* 95*  CREATININE 8.68* 8.39*  --  8.45* 8.53* 8.45*  CALCIUM 8.6* 8.7*  --  7.7* 7.8* 7.6*  MG  --   --   --  3.3* 3.1* 2.9*  PHOS  --   --   --  5.2* 5.6* 5.5*    Liver Function Tests: Recent Labs  Lab 06/01/23 1119  06/02/23 1243  AST 11 13*  ALT 11 14  ALKPHOS 53 44  BILITOT <0.2 0.7  PROT 6.7 7.0  ALBUMIN 4.3 3.6   No results for input(s): "LIPASE", "AMYLASE" in the last 168 hours. No results for input(s): "AMMONIA" in the last 168 hours.  CBC: Recent Labs  Lab 06/01/23 1119 06/02/23 1243 06/02/23 1243 06/03/23 0512 06/03/23 0916 06/03/23 1713 06/04/23 0539 06/04/23 1656 06/05/23 0543  WBC 4.3 4.3  --  3.6*  --  3.7* 4.1  --  4.3  NEUTROABS 2.6  --   --   --   --   --   --   --   --   HGB 7.9* 7.9*   < > 6.7* 7.0* 7.2* 8.1* 7.8* 8.3*  HCT 24.6* 26.1*   < > 20.9* 22.3* 22.6* 23.9* 24.0* 25.1*  MCV 94 98.5  --  95.0  --  93.8 94.1  --  91.9  PLT 173 153  --  132*  --  124* 131*  --  147*   < > = values in this interval not displayed.    Cardiac Enzymes: No results for input(s): "CKTOTAL", "CKMB", "CKMBINDEX", "TROPONINI" in the last 168 hours.  BNP: Invalid input(s): "POCBNP"  CBG: No results for input(s): "GLUCAP" in the last 168 hours.  Microbiology: Results for orders placed or performed during the hospital encounter of 03/21/20  Resp Panel by RT-PCR (Flu A&B, Covid) Nasopharyngeal Swab     Status: None   Collection Time: 03/21/20 11:32 PM   Specimen: Nasopharyngeal Swab; Nasopharyngeal(NP) swabs in vial transport medium  Result Value Ref Range Status   SARS Coronavirus 2 by RT PCR NEGATIVE NEGATIVE Final    Comment: (NOTE) SARS-CoV-2 target nucleic acids are NOT DETECTED.  The SARS-CoV-2 RNA is generally detectable in upper respiratory specimens during the acute phase of infection. The lowest concentration of SARS-CoV-2 viral copies this assay can detect is 138 copies/mL. A negative result does not preclude SARS-Cov-2 infection and should not be used as the sole basis for treatment or other patient management decisions. A negative result may occur with  improper specimen collection/handling, submission of specimen other than nasopharyngeal swab, presence of viral  mutation(s) within the areas targeted by this assay, and inadequate number of viral copies(<138 copies/mL). A negative result must be combined with clinical observations, patient history, and epidemiological information. The expected result is Negative.  Fact Sheet for Patients:  BloggerCourse.com  Fact Sheet for Healthcare Providers:  SeriousBroker.it  This test is no t yet approved or cleared by the [place]  FDA and  has been authorized for detection and/or diagnosis of SARS-CoV-2 by FDA under an Emergency Use Authorization (EUA). This EUA will remain  in effect (meaning this test can be used) for the duration of the COVID-19 declaration under Section 564(b)(1) of the Act, 21 U.S.C.section 360bbb-3(b)(1), unless the authorization is terminated  or revoked sooner.       Influenza A by PCR NEGATIVE NEGATIVE Final   Influenza B by PCR NEGATIVE NEGATIVE Final    Comment: (NOTE) The Xpert Xpress SARS-CoV-2/FLU/RSV plus assay is intended as an aid in the diagnosis of influenza from Nasopharyngeal swab specimens and should not be used as a sole basis for treatment. Nasal washings and aspirates are unacceptable for Xpert Xpress SARS-CoV-2/FLU/RSV testing.  Fact Sheet for Patients: BloggerCourse.com  Fact Sheet for Healthcare Providers: SeriousBroker.it  This test is not yet approved or cleared by the [place]  FDA and has been authorized for detection and/or diagnosis of SARS-CoV-2 by FDA under an Emergency Use Authorization (EUA). This EUA will remain in effect (meaning this test can be used) for the duration of the COVID-19 declaration under Section 564(b)(1) of the Act, 21 U.S.C. section 360bbb-3(b)(1), unless the authorization is terminated or revoked.  Performed at [facility], [address] [place], [place] [address]   MRSA PCR Screening     Status:  None   Collection Time: 03/22/20  6:18 AM   Specimen: Nasopharyngeal  Result Value Ref Range Status   MRSA by PCR NEGATIVE NEGATIVE Final    Comment:        The GeneXpert MRSA Assay (FDA approved for NASAL specimens only), is one component of a comprehensive MRSA colonization surveillance program. It is not intended to diagnose MRSA infection nor to guide or monitor treatment for MRSA infections. Performed at [facility] [address] [place], [place] [address]   Urine Culture     Status: Abnormal   Collection Time: 03/23/20 11:44 PM   Specimen: Urine, Clean Catch  Result Value Ref Range Status   Specimen Description   Final    URINE, CLEAN CATCH Performed at [facility], [address], [place], [place] [address]  Special Requests   Final    NONE Performed at [facility], [address] [place], [place] [address]    Culture (A)  Final    10,000 COLONIES/mL GROUP B STREP(S.AGALACTIAE)ISOLATED TESTING AGAINST S. AGALACTIAE NOT ROUTINELY PERFORMED DUE TO PREDICTABILITY OF AMP/PEN/VAN SUSCEPTIBILITY. Performed at [facility], [address], [place], [place] [address]    Report Status 03/25/2020 FINAL  Final    Coagulation Studies: Recent Labs    06/02/23 1243 06/03/23 0512  LABPROT 13.7 14.7  INR 1.0 1.1    Urinalysis: Recent Labs    06/03/23 1055  COLORURINE COLORLESS*  LABSPEC 1.009  PHURINE 6.0  GLUCOSEU 150*  HGBUR NEGATIVE  BILIRUBINUR NEGATIVE  KETONESUR NEGATIVE  PROTEINUR NEGATIVE  NITRITE NEGATIVE  LEUKOCYTESUR NEGATIVE      Imaging: No results found.    Medications:     amLODipine   10 mg Oral Daily   calcium acetate  667 mg Oral TID WC   Chlorhexidine  Gluconate Cloth  6 each Topical Q0600   cloNIDine   0.1 mg Oral BID   heparin   5,000 Units Subcutaneous Q8H   iron polysaccharides  150 mg Oral Daily   isosorbide  mononitrate  30 mg Oral Daily   labetalol   200 mg Oral BID   omega-3 acid  ethyl esters  1 g Oral Daily   sodium bicarbonate  1,300 mg Oral TID   sodium chloride  flush  3 mL Intravenous Q12H   acetaminophen **OR** acetaminophen, polyethylene glycol  Assessment/ Plan:  Mr. Trevor Meyer is a 58 y.o. Asian American [place]) male with polycystic kidney disease who is admitted to [facility] on 06/02/2023 for Hyperkalemia [E87.5] ESRD (end stage renal disease) (HCC) [N18.6]   Acute Kidney Injury on Chronic Kidney Disease stage IV versus progression to end stage renal disease. Complicated with hyperkalemia, metabolic acidosis, hypocalcemia and symptoms of uremia. Not seen by nephrology in 2 years.  - patient does not want dialysis at this point. He would like to travel with his family for a [place] cruise which he has planned for for the last 2 years.  - patient without vascular access - discussed home modalities with patient - Patient to continue sodium bicarbonate.  - holding irbesartan  - patient is currently not on any diuretics.  - Plan is to allow patient to go on his vacation. We will obtain labs later this week as outpatient.  - will provide samples of potassium binders for patient to take daily until he returns to see nephrology.     Lab Results  Component Value Date   CREATININE 8.45 (H) 06/05/2023   CREATININE 8.53 (H) 06/04/2023   CREATININE 8.45 (H) 06/03/2023       Intake/Output Summary (Last 24 hours) at 06/05/2023 1042 Last data filed at 06/05/2023 1914 Gross per 24 hour  Intake 240 ml  Output 2800 ml  Net -2560 ml    2. Hypertension with chronic kidney disease: elevated at 155/87.   Current regimen of amlodipine , clonidine , labetalol , isosorbide  mononitrate.   - holding irbesartan  due to renal injury and hyperkalemia.   - start patient on furosemide 40mg  daily.    3. Anemia of chronic disease: status post PRBC transfusion on 5/24.   - Do not recommend further transfusions. Patient is a candidate for renal transplant  - consider ESA  administration in the future.    Latest Reference Range & Units Most Recent 06/03/23 17:13 06/04/23 05:39  Hemoglobin 13.0 - 17.0 g/dL 8.1 (L) 7/82/95 62:13 7.2 (  L) 8.1 (L)  (L): Data is abnormally low    4. Secondary Hyperparathyroidism 06/04/2023 PTH 28 06/04/2023 Phos 5.6 06/04/2023 Ca 7.8   - started on calcium acetate on this admission for hyperphosphatemia and hypocalcemia.      LOS: 3 Kirk Basquez 5/26/202510:42 AM

## 2023-06-05 NOTE — TOC CM/SW Note (Signed)
 Transition of Care [facility]) - Inpatient Brief Assessment   Patient Details  Name: Trevor Meyer MRN: [id number] Date of Birth: 1965-04-08  Transition of Care [facility]) CM/SW Contact:    Loman Risk, RN Phone Number: 06/05/2023, 11:01 AM   Clinical Narrative:   Transition of Care [facility]) Screening Note   Patient Details  Name: Trevor Meyer Date of Birth: 1965-03-11   Transition of Care [facility]) CM/SW Contact:    Loman Risk, RN Phone Number: 06/05/2023, 11:01 AM    Transition of Care Department [facility]) has reviewed patient and no TOC needs have been identified at this time. If new patient transition needs arise, please place a TOC consult.    Transition of Care Asessment: Insurance and Status: Insurance coverage has been reviewed Patient has primary care physician: Yes     Prior/Current Home Services: No current home services Social Drivers of Health Review: SDOH reviewed no interventions necessary Readmission risk has been reviewed: Yes Transition of care needs: no transition of care needs at this time

## 2023-06-05 NOTE — Progress Notes (Signed)
 [facility]  Daily Progress Note   Subjective  -   No complaints.  No CP.  Confirms that the Nephrology Team is going to delay starting HD until after his vacation.  Objective Vitals:   06/04/23 2127 06/05/23 0500 06/05/23 0510 06/05/23 0859  BP: (!) 154/89  (!) 158/93 (!) 155/87  Pulse: 80  71 70  Resp:   17 18  Temp:   98 F (36.7 C) 98.4 F (36.9 C)  TempSrc:   Oral   SpO2:   98% 100%  Weight:  67.7 kg    Height:        Intake/Output Summary (Last 24 hours) at 06/05/2023 1237 Last data filed at 06/05/2023 1610 Gross per 24 hour  Intake 240 ml  Output 2800 ml  Net -2560 ml    PULM  CTAB CV  RRR VASC  No abnormalities noted  Laboratory CBC    Component Value Date/Time   WBC 4.3 06/05/2023 0543   HGB 8.3 (L) 06/05/2023 0543   HGB 7.9 (L) 06/01/2023 1119   HCT 25.1 (L) 06/05/2023 0543   HCT 24.6 (L) 06/01/2023 1119   PLT 147 (L) 06/05/2023 0543   PLT 173 06/01/2023 1119    BMET    Component Value Date/Time   NA 142 06/05/2023 0543   NA 144 06/01/2023 1119   K 4.9 06/05/2023 0543   CL 113 (H) 06/05/2023 0543   CO2 20 (L) 06/05/2023 0543   GLUCOSE 93 06/05/2023 0543   BUN 95 (H) 06/05/2023 0543   BUN 99 (HH) 06/01/2023 1119   CREATININE 8.45 (H) 06/05/2023 0543   CALCIUM 7.6 (L) 06/05/2023 0543   GFRNONAA 7 (L) 06/05/2023 0543    Assessment/Planning: HD #2 s/p CRI from 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 He needs a chest CT (will need to be non-con for now) for descending thoracic intramural hematoma follow-up (from 2022).  I told him this and his son as well to remind him not to forget as this is potentially life threatening.  Also can be arranged as outpatient at Vascular Office f/u.  Will likely also heed a LUE access  procedure.  Jerri Morale  06/05/2023, 12:37 PM

## 2023-06-05 NOTE — Plan of Care (Signed)

## 2023-06-06 LAB — PROTEIN ELECTROPHORESIS, SERUM
A/G Ratio: 1.5 (ref 0.7–1.7)
Albumin ELP: 3.4 g/dL (ref 2.9–4.4)
Alpha-1-Globulin: 0.2 g/dL (ref 0.0–0.4)
Alpha-2-Globulin: 0.5 g/dL (ref 0.4–1.0)
Beta Globulin: 0.8 g/dL (ref 0.7–1.3)
Gamma Globulin: 0.9 g/dL (ref 0.4–1.8)
Globulin, Total: 2.3 g/dL (ref 2.2–3.9)
Total Protein ELP: 5.7 g/dL — ABNORMAL LOW (ref 6.0–8.5)

## 2023-06-07 LAB — HEPATITIS B SURFACE ANTIBODY, QUANTITATIVE: Hep B S AB Quant (Post): 449 m[IU]/mL

## 2023-06-27 ENCOUNTER — Ambulatory Visit (INDEPENDENT_AMBULATORY_CARE_PROVIDER_SITE_OTHER): Admitting: Physician Assistant

## 2023-06-27 ENCOUNTER — Encounter: Payer: Self-pay | Admitting: Physician Assistant

## 2023-06-27 VITALS — BP 156/88 | HR 86 | Temp 98.5°F | Ht 67.0 in | Wt 147.0 lb

## 2023-06-27 DIAGNOSIS — N2581 Secondary hyperparathyroidism of renal origin: Secondary | ICD-10-CM | POA: Diagnosis not present

## 2023-06-27 DIAGNOSIS — I1A0 Resistant hypertension: Secondary | ICD-10-CM | POA: Diagnosis not present

## 2023-06-27 DIAGNOSIS — E875 Hyperkalemia: Secondary | ICD-10-CM | POA: Diagnosis not present

## 2023-06-27 DIAGNOSIS — N186 End stage renal disease: Secondary | ICD-10-CM | POA: Diagnosis not present

## 2023-06-27 DIAGNOSIS — I71012 Dissection of descending thoracic aorta: Secondary | ICD-10-CM

## 2023-06-27 NOTE — Progress Notes (Signed)
 Date:  06/27/2023   Name:  Trevor Meyer   DOB:  [birth year]   MRN:  [id number]   Chief Complaint: Medical Management of Chronic Issues  HPI Trevor Meyer is a very pleasant 58 y.o. male with history of CKD5, polycystic kidney, resistant HTN, and history of aortic dissection/hematoma in 2022 who returns today for f/u after establishing care 3 weeks ago. Labs at the time were so alarming that I sent him to the hospital where he was admitted for 3 days due to renal failure, severe anemia, and severe hyperkalemia.   Last visit he revealed he did not have any medical follow up since his last nephrology visit with Dr. Zelda Hickman in Feb 2023; this provider had been filling his antihypertensives for the last several years. He now has nephrology appointment scheduled for 06/29/23 (in two days). Recent labs done by them 06/07/23 reviewed by me consistent with advanced renal disease. He reports good urine output, clear-yellow in color. Denies hematuria or dark urine.  Patient reports good compliance with medications, has a BP cuff at home with reported readings generally 130's-140s systolic. Has only taken clonidine  today, takes his other antihypertensives later. Due to getting lightheaded with medications, he usually takes most of them in the late morning with food, then for clonidine  and labetalol  he takes a second dose later in the evening. He was taken off irbesartan  in the ED due to ESRD and remains off it, states he feels a little better since stopping.   Regarding the aorta, seen by Rheum 04/07/20 to r/o vasculitis but they suspected intramural descending aortic dissection secondary to uncontrolled HTN and suggested to proceed with vascular and nephrology follow up. Apparently vascular recommended management as a type B dissection but he was never seen by them as an outpatient and ultimately lost to follow up altogether for this problem.    Medication list has been reviewed and updated.  Current Meds  Medication  Sig   amLODipine  (NORVASC ) 10 MG tablet Take 1 tablet (10 mg total) by mouth daily.   calcium  acetate (PHOSLO ) 667 MG capsule Take 1 capsule (667 mg total) by mouth 3 (three) times daily with meals.   cloNIDine  (CATAPRES ) 0.1 MG tablet Take 1 tablet (0.1 mg total) by mouth 2 (two) times daily.   furosemide  (LASIX ) 40 MG tablet Take 1 tablet (40 mg total) by mouth daily.   iron  polysaccharides (NIFEREX) 150 MG capsule Take 1 capsule (150 mg total) by mouth daily.   isosorbide  mononitrate (IMDUR ) 30 MG 24 hr tablet Take 1 tablet (30 mg total) by mouth daily.   labetalol  (NORMODYNE ) 200 MG tablet Take 1 tablet (200 mg total) by mouth 2 (two) times daily.   Misc Natural Products (PROSTATE HEALTH PO) Take by mouth.   Omega-3 Fatty Acids (FISH OIL) 1000 MG CAPS Take by mouth.   pantoprazole  (PROTONIX ) 40 MG tablet Take 1 tablet (40 mg total) by mouth daily.   sodium bicarbonate  650 MG tablet Take 1 tablet (650 mg total) by mouth 3 (three) times daily.     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 No pertinent surgical history.  Social History   Tobacco Use   Smoking status: Never   Smokeless tobacco: Never  Vaping Use   Vaping status: Never Used  Substance Use Topics   Alcohol use: Never   Drug use: Never    Family History  Problem Relation Age of Onset   Cancer Mother    Stroke Father         06/01/2023   10:31 AM  GAD 7 : Generalized Anxiety Score  Nervous, Anxious, on Edge 0  Control/stop worrying 0  Worry too much - different things 0  Trouble relaxing 0  Restless 0  Easily annoyed or irritable 0  Afraid - awful might happen 0  Total GAD 7 Score 0  Anxiety Difficulty Not difficult at all       06/01/2023   10:31 AM  Depression screen PHQ 2/9  Decreased Interest 0  Down, Depressed, Hopeless 0  PHQ - 2 Score 0    BP Readings from Last 3 Encounters:  06/27/23 (!) 156/88  06/05/23 (!) 155/87  06/01/23 (!) 150/84    Wt Readings from Last 3 Encounters:  06/27/23 147 lb (66.7 kg)  06/05/23 149 lb 4 oz (67.7 kg)  06/01/23 148 lb (67.1 kg)    BP (!) 156/88 (BP Location: Left Arm, Cuff Size: Normal)   Pulse 86   Temp 98.5 F (36.9 C)   Ht 5' 7 (1.702 m)   Wt 147 lb (66.7 kg)   SpO2 95%   BMI 23.02 kg/m   Physical Exam Vitals and nursing note reviewed.  Constitutional:      Appearance: Normal appearance.   Cardiovascular:     Rate and Rhythm: Normal rate.  Pulmonary:     Effort: Pulmonary effort is normal.  Abdominal:     General: There is no distension.   Musculoskeletal:        General: Normal range of motion.   Skin:    General: Skin is warm and dry.   Neurological:     Mental Status: He is alert and oriented to person, place, and time.     Gait: Gait is intact.   Psychiatric:        Mood and Affect: Mood and affect normal.     Recent Labs     Component Value Date/Time   NA 142 06/05/2023 0543   NA 144 06/01/2023 1119   K 4.9 06/05/2023  0543   CL 113 (H) 06/05/2023 0543   CO2 20 (L) 06/05/2023 0543   GLUCOSE 93 06/05/2023 0543   BUN 95 (H) 06/05/2023 0543   BUN 99 (HH) 06/01/2023 1119   CREATININE 8.45 (H) 06/05/2023 0543   CALCIUM  7.6 (L) 06/05/2023 0543   PROT 7.0 06/02/2023 1243   PROT 6.7 06/01/2023 1119   ALBUMIN 3.6 06/02/2023 1243   ALBUMIN 4.3 06/01/2023 1119   AST 13 (L) 06/02/2023 1243   ALT 14 06/02/2023 1243  ALKPHOS 44 06/02/2023 1243   BILITOT 0.7 06/02/2023 1243   BILITOT <0.2 06/01/2023 1119   GFRNONAA 7 (L) 06/05/2023 0543    Lab Results  Component Value Date   WBC 4.3 06/05/2023   HGB 8.3 (L) 06/05/2023   HCT 25.1 (L) 06/05/2023   MCV 91.9 06/05/2023   PLT 147 (L) 06/05/2023   Lab Results  Component Value Date   HGBA1C 5.7 (H) 06/01/2023   Lab Results  Component Value Date   CHOL 212 (H) 06/01/2023   HDL 39 (L) 06/01/2023   LDLCALC 153 (H) 06/01/2023   TRIG 110 06/01/2023   CHOLHDL 5.4 (H) 06/01/2023   Lab Results  Component Value Date   TSH 1.940 06/01/2023     Assessment and Plan:  ESRD (end stage renal disease) (HCC) Assessment & Plan: Follow-up with nephrology as planned in 2 days   Resistant hypertension Assessment & Plan: Elevated x 2 in clinic today, though he has only taken clonidine  so far today.  Sounds like home readings are reasonably well-controlled.  Avoid ACE/ARB due to ESRD.  Will defer any further modifications to his antihypertensive regimen to nephrology, who he sees in 2 days.  Continue home BP monitoring.   Hyperparathyroidism due to renal insufficiency [facility]) Assessment & Plan: Noted on recent labs, but mild.  Follow-up with nephrology   Hyperkalemia Assessment & Plan: Recent trial of Lokelma  well-tolerated by patient, follow-up with nephrology for prescription for this.  Last potassium 5.3 on 06/07/2023.   Dissection of descending thoracic aorta [facility]) Assessment & Plan: Submitting new referral to vascular surgery.  Will defer any decisions  on imaging to them.  Unable to tolerate contrast due to ESRD, so likely will need MRI versus ultrasound.  Orders: -     Ambulatory referral to Vascular Surgery     Return in about 2 months (around 08/27/2023) for OV f/u chronic conditions.   Today's visit billed for provider time of 35 minutes inclusive of chart review for multiple serious chronic conditions, recent hospitalization, review of external labs, review of specialist notes, physical exam, patient education.  Cody Das, PA-C, DMSc, Nutritionist [facility] [phone]

## 2023-06-27 NOTE — Assessment & Plan Note (Signed)
 Recent trial of Lokelma  well-tolerated by patient, follow-up with nephrology for prescription for this.  Last potassium 5.3 on 06/07/2023.

## 2023-06-27 NOTE — Assessment & Plan Note (Signed)
 Noted on recent labs, but mild.  Follow-up with nephrology

## 2023-06-27 NOTE — Assessment & Plan Note (Signed)
 Elevated x 2 in clinic today, though he has only taken clonidine  so far today.  Sounds like home readings are reasonably well-controlled.  Avoid ACE/ARB due to ESRD.  Will defer any further modifications to his antihypertensive regimen to nephrology, who he sees in 2 days.  Continue home BP monitoring.

## 2023-06-27 NOTE — Assessment & Plan Note (Signed)
 Follow-up with nephrology as planned in 2 days

## 2023-06-27 NOTE — Assessment & Plan Note (Signed)
 Submitting new referral to vascular surgery.  Will defer any decisions on imaging to them.  Unable to tolerate contrast due to ESRD, so likely will need MRI versus ultrasound.

## 2023-07-10 ENCOUNTER — Encounter: Payer: Self-pay | Admitting: Surgery

## 2023-07-10 ENCOUNTER — Ambulatory Visit (INDEPENDENT_AMBULATORY_CARE_PROVIDER_SITE_OTHER): Admitting: Surgery

## 2023-07-10 VITALS — BP 139/86 | HR 87 | Ht 67.0 in | Wt 148.0 lb

## 2023-07-10 DIAGNOSIS — K429 Umbilical hernia without obstruction or gangrene: Secondary | ICD-10-CM | POA: Diagnosis not present

## 2023-07-10 DIAGNOSIS — K4090 Unilateral inguinal hernia, without obstruction or gangrene, not specified as recurrent: Secondary | ICD-10-CM | POA: Diagnosis not present

## 2023-07-10 DIAGNOSIS — N185 Chronic kidney disease, stage 5: Secondary | ICD-10-CM | POA: Diagnosis not present

## 2023-07-10 NOTE — Patient Instructions (Signed)
 Do call Vein & Vascular for a consultation. See paperwork given.   We will schedule you for a Peritoneal Dialysis Catheter placement. This will be done at [facility] by Dr. Desiderio. Please see the following information regarding your surgery.   Typically, our patients are out of work 1-2 weeks following the surgery and may return with a lifting restriction of no more than 15 lbs. For a complete 6 weeks following surgery.  If you have FMLA or Disability paperwork that needs to be filled out, please have your company fax your paperwork to [phone] or you may drop this by either office. This paperwork will be filled out within 3 days after your surgery has been completed.  Also, please review the [facility] Pre-Care sheet for further information regarding your surgery. Our surgery schedule will call you to verify surgery date and to go over information.  If you have any questions, please do not hesitate to contact our office.  Peritoneal Dialysis Information Dialysis is a procedure that does some of the work healthy kidneys do. Peritoneal dialysis uses the thin lining of the belly (peritoneum) and a fluid called dialysate to remove wastes, salt, and extra water from the blood. Tell a health care provider about: Any allergies you have. All medicines you are taking. This includes vitamins, herbs, eye drops, creams, and over-the-counter medicines. Any problems you or family members have had with anesthetic medicines. Any blood disorders you have. Any surgeries you have. Any medical conditions you have. Whether you are pregnant or may be pregnant. What are the risks? Generally, this treatment is safe. But, problems may occur, including: Infection of the lining of the belly. Infection around the tube (catheter) that was inserted. Pain in the area where the tube was inserted. Weak muscles in the belly area. What happens before the procedure? Take steps to prevent infection. Your doctor will  tell you what to do before treatment. You may have to: Put on a mask. Close doors and windows in your room. Wash your hands before and during treatment. Anyone who touches you or the machine must also wash hands often. Make sure that the machine and tubing do not have germs (are sterile). Check the bag of dialysate. Make sure it is sealed and free of germs. What happens during the procedure?  At the start of each treatment, your belly will be filled with dialysate. The dialysate pulls waste, salt, and water through the lining of the belly. At the end of each treatment, the dialysate, salt, and water will be drained from your body. The treatment will be done using one of these methods: Continuous cycling peritoneal dialysis (CCPD). In this type, a machine fills and drains your belly while you sleep. Continuous ambulatory peritoneal dialysis (CAPD). This type is done up to 5 times a day. Each treatment takes about 30-40 minutes. You may do your normal activities between treatments. In some cases, both CCPD and CAPD are used. What can I expect after the procedure? Lab tests may be done to check how your treatment is working. Change the bandage (dressing) around your tube as told by your doctor. Keep the bandage clean and dry. Weigh yourself after the treatment and write down your weight. Follow these instructions at home: Eating and drinking Follow what your doctor tells you about eating and drinking. Your diet plan should include: Talking with a food expert (dietitian). Vitamin supplements. Good proteins. This includes meat, poultry, fish, and eggs. You may need to eat a high-protein  diet. Preventing constipation Take steps to prevent problems when pooping (constipation): Eat foods that are high in fiber. This includes beans, whole grains, and fresh fruits and vegetables. Limit foods that are high in fat and sugars. This includes fried or sweet foods. Be active. Go to the bathroom when you  need to. Do not hold it in. Take medicines to treat this problem only if your doctor tells you to. General instructions Keep a strict schedule. The treatment must be done every day. Do not skip a day or a treatment. Make time for each treatment. Keep all supplies in a cool, clean, and dry place. Take all medicines only as told by your doctor. Weigh yourself every day. Sudden weight gain may be a sign of a problem. Keep all follow-up visits. Where to find more information [employer] of Diabetes and Digestive and Kidney Diseases: CarFlippers.tn National Kidney Foundation: www.kidney.org Contact a doctor if: You have a fever or chills. You vomit. You feel like you may vomit. You have watery poop (diarrhea). You have problems doing the treatment. Your blood pressure goes up. You gain weight in a short time. You feel short of breath all of a sudden. The tube in your belly seems loose or feels like it is coming out. The fluid from your belly is pinkish or reddish. Women who are having their period do not need to get help if the fluid is only a little pink or a little red. There are white strands in the dialysate. The strands can get stuck in your tubing. Get help right away if: The area around the tube in your belly swells or gets red, tender, or painful. There is pus coming from the area around the tube in your belly. The fluid from your belly is cloudy. You have more belly pain. Summary Peritoneal dialysis does some of the work healthy kidneys do. CAPD and CCPD are the two ways of doing dialysis. Your doctor will help you decide which type is best for you. This information is not intended to replace advice given to you by your health care provider. Make sure you discuss any questions you have with your health care provider.

## 2023-07-10 NOTE — Progress Notes (Signed)
 07/10/2023  Reason for Visit: Chronic kidney disease stage V  Requesting Provider:   Saralee Stank, MD  History of Present Illness: Trevor Meyer is a 58 y.o. male presenting for evaluation for peritoneal dialysis catheter placement.  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  Over the course of these years this has progressed to stage V chronic kidney disease and is approaching the need for dialysis.  After discussing with Dr. Stank about the options for hemodialysis at dialysis center, hemodialysis at home, or peritoneal dialysis at home, the patient has opted for peritoneal dialysis at home.  He presents today for discussion about surgery.  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  A new referral has been sent but the patient has not been contacted yet.  He denies any abdominal pain, nausea, vomiting.  He had a recent admission on 06/01/2023 at which time his BNP was markedly altered with a potassium of 6.3, BUN of 99, and creatinine of 8.68.  At the time he was offered initiation of dialysis but he declined as he was about to go on a trip to [place].  Now he is ready for dialysis.  Most recent labs on 06/29/2023 show potassium of 3.9, BUN of 111 and creatinine of 9.41.  Past Medical History: Past Medical History:  Diagnosis Date   Chronic kidney disease 03/2020   Clotting disorder (HCC) 2000   Hypertension 03/2020     Past Surgical History: History reviewed. No pertinent surgical history.  Home Medications: Prior to Admission medications   Medication Sig Start Date End Date Taking? Authorizing Provider  amLODipine  (NORVASC ) 10 MG tablet Take 1 tablet (10 mg total) by mouth daily. 03/27/20 07/10/23 Yes  Maree Hue, MD  cloNIDine  (CATAPRES ) 0.1 MG tablet Take 1 tablet (0.1 mg total) by mouth 2 (two) times daily. 03/26/20  Yes Maree Hue, MD  furosemide  (LASIX ) 40 MG tablet Take 1 tablet (40 mg total) by mouth daily. 06/05/23 09/03/23 Yes Von Bellis, MD  iron  polysaccharides (NIFEREX) 150 MG capsule Take 1 capsule (150 mg total) by mouth daily. 06/09/23 09/07/23 Yes Von Bellis, MD  isosorbide  mononitrate (IMDUR ) 30 MG 24 hr tablet Take 1 tablet (30 mg total) by mouth daily. 03/27/20 07/10/23 Yes Maree Hue, MD  labetalol  (NORMODYNE ) 200 MG tablet Take 1 tablet (200 mg total) by mouth 2 (two) times daily. 03/26/20 07/10/23 Yes Maree Hue, MD  Misc Natural Products (PROSTATE HEALTH PO) Take by mouth.   Yes [provider]  Omega-3 Fatty Acids (FISH OIL) 1000 MG CAPS Take by mouth.   Yes [provider]  pantoprazole  (PROTONIX ) 40 MG tablet Take 1 tablet (40 mg total) by mouth daily. 06/06/23 09/04/23 Yes Von Bellis, MD  sodium bicarbonate  650 MG tablet Take 1 tablet (650 mg total) by mouth 3 (three) times daily. 06/05/23 09/03/23 Yes Von Bellis, MD    Allergies: Allergies  Allergen Reactions   Hydralazine  Other (See Comments)    Flushing and tachycardia  Other reaction(s): Other (See Comments)  Flushing and tachycardia  Flushing and tachycardia   Hydralazine  Hcl Other (See Comments)    Flushing and tachycardia    Social History:  reports that he has never smoked. He has never been  exposed to tobacco smoke. He has never used smokeless tobacco. He reports that he does not drink alcohol and does not use drugs.   Family History: Family History  Problem Relation Age of Onset   Cancer Mother    Stroke Father     Review of Systems: Review of Systems  Constitutional:  Negative for chills and fever.  HENT:  Negative for hearing loss.   Respiratory:  Negative for shortness of breath.   Cardiovascular:  Negative for chest pain.  Gastrointestinal:  Negative for  abdominal pain, nausea and vomiting.  Genitourinary:  Negative for dysuria.  Musculoskeletal:  Negative for myalgias.  Skin:  Negative for rash.  Neurological:  Negative for dizziness.  Psychiatric/Behavioral:  Negative for depression.     Physical Exam BP 139/86   Pulse 87   Ht 5' 7 (1.702 m)   Wt 148 lb (67.1 kg)   SpO2 98%   BMI 23.18 kg/m  CONSTITUTIONAL: No acute distress, well-nourished HEENT:  Normocephalic, atraumatic, extraocular motion intact. NECK: Trachea is midline, and there is no jugular venous distension.  RESPIRATORY:  Lungs are clear, and breath sounds are equal bilaterally. Normal respiratory effort without pathologic use of accessory muscles. CARDIOVASCULAR: Heart is regular without murmurs, gallops, or rubs. GI: The abdomen is soft, nondistended, nontender to palpation.  The patient has a small reducible umbilical hernia as well as a small reducible right inguinal hernia.  No hernia noted in the left groin.  MUSCULOSKELETAL:  Normal muscle strength and tone in all four extremities.  No peripheral edema or cyanosis. SKIN: Skin turgor is normal. There are no pathologic skin lesions.  NEUROLOGIC:  Motor and sensation is grossly normal.  Cranial nerves are grossly intact. PSYCH:  Alert and oriented to person, place and time. Affect is normal.  Laboratory Analysis: Labs from 06/29/2023: Sodium 141, potassium 3.9, chloride 99, CO2 24, BUN 111, creatinine 9.41.  Calcium  10.7, phosphorus 5.9, albumin 4.4.  Imaging: CT angio chest, abdomen, pelvis on 03/21/2020: IMPRESSION: 1. Acute aortic syndrome - intramural hematoma extending from the left subclavian artery origin to the diaphragmatic hiatus. 2. Polycystic kidney disease with numerous large cysts.   Aortic Atherosclerosis (ICD10-I70.0).  Assessment and Plan: This is a 58 y.o. male with chronic kidney disease stage V, approaching need for dialysis.  - Discussed with patient the finding on exam today showing  that he has a umbilical hernia and a right inguinal hernia.  Discussed with the patient that peritoneal dialysis can worsen abdominal wall hernias due to the increased pressures from the fluid.  As such, is recommended to repair the hernias at the time of dialysis catheter placement.  As such, I would recommend proceeding with a minimally invasive right inguinal hernia repair with an open umbilical hernia repair at the same time of a laparoscopic peritoneal dialysis catheter placement.  The patient is in agreement. - However before any surgery is done, he needs to follow-up with vascular surgery as he has not been seen in the last 3 years.  We would need clearance from their standpoint to make sure there is no worsening of his aortic issues.  There is a referral that has been placed by his PCP but he has not been contacted for an appointment yet.  We have given him the information for the vascular surgery office so that he can reach out to them.  Will also send clearance form to the vascular surgery team. - Pending workup and follow-up with vascular surgery team, then we  will tentatively schedule him for the robotic right inguinal hernia repair, open umbilical hernia repair and laparoscopic assisted PD catheter placement on 08/03/2023.  Patient understands that we may have to move this date depending on where he is within the workup.  Reviewed the surgery at length with him including the planned incisions, risks of bleeding, infection, injury to surrounding structures, that this would be an outpatient procedure, postoperative activity restrictions, pain control, and he is willing to proceed. - All of his questions have been answered.  I spent 55 minutes dedicated to the care of this patient on the date of this encounter to include pre-visit review of records, face-to-face time with the patient discussing diagnosis and management, and any post-visit coordination of care.   Aloysius Sheree Plant, MD [facility]

## 2023-07-11 ENCOUNTER — Telehealth: Payer: Self-pay | Admitting: Surgery

## 2023-07-11 NOTE — Telephone Encounter (Signed)
 Patient has been advised of Pre-Admission date/time, and Surgery date at [facility]  Surgery Date: 08/03/23 Preadmission Testing Date: 07/26/23 (phone 8a-1p)  Patient informed of the scheduling process and surgery information given at time of office visit.   Patient has been made aware to call [phone], between 1-3:00pm the day before surgery, to find out what time to arrive for surgery.

## 2023-07-11 NOTE — Progress Notes (Signed)
 Request for Vascular Clearance has been faxed to Dr Marea.

## 2023-07-24 ENCOUNTER — Ambulatory Visit (INDEPENDENT_AMBULATORY_CARE_PROVIDER_SITE_OTHER): Admitting: Nurse Practitioner

## 2023-07-24 ENCOUNTER — Encounter (INDEPENDENT_AMBULATORY_CARE_PROVIDER_SITE_OTHER): Payer: Self-pay | Admitting: Nurse Practitioner

## 2023-07-24 VITALS — BP 163/83 | HR 67 | Resp 18 | Ht 66.0 in | Wt 150.0 lb

## 2023-07-24 DIAGNOSIS — N186 End stage renal disease: Secondary | ICD-10-CM | POA: Diagnosis not present

## 2023-07-24 DIAGNOSIS — I7100 Dissection of unspecified site of aorta: Secondary | ICD-10-CM | POA: Diagnosis not present

## 2023-07-24 NOTE — Progress Notes (Signed)
 Subjective:    Patient ID: Trevor Meyer, male    DOB: 08/29/1965, 58 y.o.   MRN: [id number] Chief Complaint  Patient presents with   New Patient (Initial Visit)    Ref [facility] consult TAA, need medical clearance for surgery    Trevor Meyer is a 58 y.o. (07/03/1965) male who presented in 2022 with complaints of chest back and abdominal pain.  During that time they found that the patient had ongoing kidney issues.  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  He notes he was contacted by previous doctor and told that he did not need to be seen but rather he just needed to have his blood pressure under control.  Currently he is followed by nephrology and it was found that the patient has progressed to end-stage renal disease and will need permanent dialysis access.  He has elected to go with peritoneal dialysis and will require placement of a peritoneal dialysis catheter.  Unfortunately because his aorta dissection has not been followed, he requires clearance before he is able to undergo surgery.          Review of Systems  Respiratory:  Negative for shortness of breath.   Cardiovascular:  Negative for chest pain.  All other systems reviewed and are negative.      Objective:   Physical Exam Vitals reviewed.  HENT:     Head: Normocephalic.  Cardiovascular:     Rate and Rhythm: Normal rate.     Pulses: Normal pulses.  Pulmonary:     Effort: Pulmonary effort is normal.  Skin:    General: Skin is warm and dry.  Neurological:     Mental Status: He is alert and oriented to person, place, and time.  Psychiatric:        Mood and Affect: Mood normal.        Behavior: Behavior normal.        Thought Content: Thought content normal.        Judgment: Judgment normal.     BP (!)  163/83   Pulse 67   Resp 18   Ht 5' 6 (1.676 m)   Wt 150 lb (68 kg)   BMI 24.21 kg/m   Past Medical History:  Diagnosis Date   Chronic kidney disease 03/2020   Clotting disorder (HCC) 2000   Hypertension 03/2020   Polycystic kidney disease     Social History   Socioeconomic History   Marital status: Married    Spouse name: Not on file   Number of children: 1   Years of education: Not on file   Highest education level: Some college, no degree  Occupational History   Not on file  Tobacco Use   Smoking status: Never    Passive exposure: Never   Smokeless tobacco: Never  Vaping Use   Vaping status: Never Used  Substance and Sexual Activity   Alcohol use: Never   Drug use: Never   Sexual activity: Yes  Other Topics Concern   Not on file  Social History Narrative   Not on file   Social Drivers of Health   Financial Resource Strain: Medium Risk (05/31/2023)   Overall Financial Resource Strain (CARDIA)    Difficulty of Paying Living Expenses: Somewhat hard  Food Insecurity: No Food Insecurity (06/02/2023)   Hunger Vital Sign    Worried About Running Out of Food in the Last Year: Never true    Ran Out of Food in the Last Year: Never true  Recent Concern: Food Insecurity - Food Insecurity Present (05/31/2023)   Hunger Vital Sign    Worried About Running Out of Food in the Last Year: Sometimes true    Ran Out of Food in the Last Year: Sometimes true  Transportation Needs: No Transportation Needs (06/02/2023)   PRAPARE - Administrator, Civil Service (Medical): No    Lack of Transportation (Non-Medical): No  Physical Activity: Sufficiently Active (05/31/2023)   Exercise Vital Sign    Days of Exercise per Week: 5 days    Minutes of Exercise per Session: 50 min  Stress: Stress Concern Present (05/31/2023)   [employer] of Occupational Health - Occupational Stress Questionnaire    Feeling of Stress : To some extent  Social Connections: Moderately  Integrated (05/31/2023)   Social Connection and Isolation Panel    Frequency of Communication with Friends and Family: More than three times a week    Frequency of Social Gatherings with Friends and Family: Once a week    Attends Religious Services: More than 4 times per year    Active Member of [employer] or Organizations: No    Attends Banker Meetings: Not on file    Marital Status: Married  Catering manager Violence: Not At Risk (06/03/2023)   Humiliation, Afraid, Rape, and Kick questionnaire    Fear of Current or Ex-Partner: No    Emotionally Abused: No    Physically Abused: No    Sexually Abused: No    History reviewed. No pertinent surgical history.  Family History  Problem Relation Age of Onset   Cancer Mother    Stroke Father     Allergies  Allergen Reactions   Hydralazine  Other (See Comments)    Flushing and tachycardia          Latest Ref Rng & Units 06/05/2023    5:43 AM 06/04/2023    4:56 PM 06/04/2023    5:39 AM  CBC  WBC 4.0 - 10.5 K/uL 4.3   4.1   Hemoglobin 13.0 - 17.0 g/dL 8.3  7.8  8.1   Hematocrit 39.0 - 52.0 % 25.1  24.0  23.9   Platelets 150 - 400 K/uL 147   131       CMP     Component Value Date/Time   NA 142 06/05/2023 0543   NA 144 06/01/2023 1119   K 4.9 06/05/2023 0543   CL 113 (H) 06/05/2023 0543   CO2 20 (L) 06/05/2023 0543   GLUCOSE 93 06/05/2023 0543   BUN 95 (H) 06/05/2023 0543   BUN 99 (HH) 06/01/2023 1119   CREATININE 8.45 (H) 06/05/2023 0543   CALCIUM  7.6 (L) 06/05/2023 0543   PROT 7.0 06/02/2023 1243   PROT 6.7 06/01/2023 1119   ALBUMIN 3.6 06/02/2023 1243   ALBUMIN 4.3 06/01/2023 1119   AST 13 (L) 06/02/2023 1243   ALT 14 06/02/2023 1243   ALKPHOS 44 06/02/2023 1243   BILITOT 0.7 06/02/2023 1243   BILITOT <0.2 06/01/2023 1119   EGFR 7 (L) 06/01/2023 1119   GFRNONAA 7 (L) 06/05/2023 0543     No results found.     Assessment & Plan:   1. Intramural aortic hematoma (HCC) (Primary) Given the patient's  end-stage renal status, a CT  without contrast would not reveal if his dissection was still present.  Based on this and MRA would be more feasible and would also be more sparing for his kidneys.  He is scheduled to have surgery on 08/03/2023.  We are hopeful that we can have this study done prior to then to be able to give clearance.  However if we find that there are issues in the patient requires further discussion we will have him follow-up - MR ANGIO CHEST WO CONTRAST; Future  2. ESRD (end stage renal disease) (HCC) Currently the patient is undergoing placement of a peritoneal dialysis catheter as she wishes to do peritoneal dialysis.  However we have discussed the placement of a fistula for backup in case there are issues with his peritoneal dialysis catheter.  We will have the patient follow-up for a upper extremity vein mapping at his convenience.   Current Outpatient Medications on File Prior to Visit  Medication Sig Dispense Refill   amLODipine  (NORVASC ) 10 MG tablet Take 1 tablet (10 mg total) by mouth daily. 30 tablet 0   Ascorbic Acid (VITAMIN C PO) Take 1 tablet by mouth in the morning.     B Complex-C (B-COMPLEX WITH VITAMIN C) tablet Take 1 tablet by mouth daily in the afternoon.     cloNIDine  (CATAPRES ) 0.1 MG tablet Take 1 tablet (0.1 mg total) by mouth 2 (two) times daily. 60 tablet 11   furosemide  (LASIX ) 40 MG tablet Take 1 tablet (40 mg total) by mouth daily. 30 tablet 2   iron  polysaccharides (NIFEREX) 150 MG capsule Take 1 capsule (150 mg total) by mouth daily. 30 capsule 2   isosorbide  mononitrate (IMDUR ) 30 MG 24 hr tablet Take 1 tablet (30 mg total) by mouth daily. 30 tablet 0   labetalol  (NORMODYNE ) 200 MG tablet Take 1 tablet (200 mg total) by mouth 2 (two) times daily. 60 tablet 0   MAGNESIUM  PO Take 1 capsule by mouth in the morning.     Multiple Vitamins-Minerals (ZINC PO) Take 1 tablet by mouth in the morning.     Omega-3 Fatty Acids (FISH OIL) 1000 MG CAPS Take 1  capsule by mouth at bedtime.     pantoprazole  (PROTONIX ) 40 MG tablet Take 1 tablet (40 mg total) by mouth daily. 30 tablet 2   Propylene Glycol (LUBRICANT EYE DROP) 0.6 % SOLN Place 1-2 drops into both eyes 3 (three) times daily as needed (dry/irritated eyes.).     sodium bicarbonate  650 MG tablet Take 1 tablet (650 mg total) by mouth 3 (three) times daily. 90 tablet 2   No current facility-administered medications on file prior to visit.    There are no Patient Instructions on file for this visit. No follow-ups on file.   Marsa Matteo E Anastaisa Wooding, NP

## 2023-07-26 ENCOUNTER — Other Ambulatory Visit: Payer: Self-pay

## 2023-07-26 ENCOUNTER — Encounter
Admission: RE | Admit: 2023-07-26 | Discharge: 2023-07-26 | Disposition: A | Discharge status: Home / Self Care | Source: Ambulatory Visit | Attending: Surgery | Admitting: Surgery

## 2023-07-26 DIAGNOSIS — I1A0 Resistant hypertension: Secondary | ICD-10-CM

## 2023-07-26 DIAGNOSIS — D508 Other iron deficiency anemias: Secondary | ICD-10-CM

## 2023-07-26 DIAGNOSIS — N186 End stage renal disease: Secondary | ICD-10-CM

## 2023-07-26 HISTORY — DX: Anemia, unspecified: D64.9

## 2023-07-26 NOTE — Patient Instructions (Addendum)
 Your procedure is scheduled on: 08/03/23 - Thursday Report to the Registration Desk on the 1st floor of the [facility]. To find out your arrival time, please call [phone] between 1PM - 3PM on: 08/02/23 - Wednesday If your arrival time is 6:00 am, do not arrive before that time as the [facility] entrance doors do not open until 6:00 am.  REMEMBER: Instructions that are not followed completely may result in serious medical risk, up to and including death; or upon the discretion of your surgeon and anesthesiologist your surgery may need to be rescheduled.  Do not eat food after midnight the night before surgery.  No gum chewing or hard candies.  You may however, drink CLEAR liquids up to 2 hours before you are scheduled to arrive for your surgery. Do not drink anything within 2 hours of your scheduled arrival time.  Clear liquids include: - water  - apple juice without pulp - gatorade (not RED colors) - black coffee or tea (Do NOT add milk or creamers to the coffee or tea) Do NOT drink anything that is not on this list.  One week prior to surgery: Stop Anti-inflammatories (NSAIDS) such as Advil, Aleve, Ibuprofen, Motrin, Naproxen, Naprosyn and Aspirin based products such as Excedrin, Goody's Powder, BC Powder. You may take Tylenol  if needed.  Stop ANY OVER THE COUNTER supplements until after surgery : VITAMIN C , B Complex-C ,MAGNESIUM  ,Multiple Vitamins-Minerals , Omega-3 Fatty Acids .  - continue your iron  polysaccharides (NIFEREX)   HOLD furosemide  (LASIX ) on the morning of surgery.   ON THE DAY OF SURGERY ONLY TAKE THESE MEDICATIONS WITH SIPS OF WATER:  amLODipine  (NORVASC )  cloNIDine  (CATAPRES )  isosorbide  mononitrate (IMDUR )  labetalol  (NORMODYNE )  pantoprazole  (PROTONIX ) sodium bicarbonate      No Alcohol for 24 hours before or after surgery.  No Smoking including e-cigarettes for 24 hours before surgery.  No chewable tobacco products for at least 6 hours  before surgery.  No nicotine patches on the day of surgery.  Do not use any recreational drugs for at least a week (preferably 2 weeks) before your surgery.  Please be advised that the combination of cocaine and anesthesia may have negative outcomes, up to and including death. If you test positive for cocaine, your surgery will be cancelled.  On the morning of surgery brush your teeth with toothpaste and water, you may rinse your mouth with mouthwash if you wish. Do not swallow any toothpaste or mouthwash.  Use CHG Soap or wipes as directed on instruction sheet.  Do not wear jewelry, make-up, hairpins, clips or nail polish.  For welded (permanent) jewelry: bracelets, anklets, waist bands, etc.  Please have this removed prior to surgery.  If it is not removed, there is a chance that hospital personnel will need to cut it off on the day of surgery.  Do not wear lotions, powders, or perfumes.   Do not shave body hair from the neck down 48 hours before surgery.  Contact lenses, hearing aids and dentures may not be worn into surgery.  Do not bring valuables to the hospital. [facility] is not responsible for any missing/lost belongings or valuables.   Notify your doctor if there is any change in your medical condition (cold, fever, infection).  Wear comfortable clothing (specific to your surgery type) to the hospital.  After surgery, you can help prevent lung complications by doing breathing exercises.  Take deep breaths and cough every 1-2 hours. Your doctor may order a device  called an Facilities manager to help you take deep breaths.  When coughing or sneezing, hold a pillow firmly against your incision with both hands. This is called "splinting." Doing this helps protect your incision. It also decreases belly discomfort.  If you are being admitted to the hospital overnight, leave your suitcase in the car. After surgery it may be brought to your room.  In case of increased  patient census, it may be necessary for you, the patient, to continue your postoperative care in the Same Day Surgery department.  If you are being discharged the day of surgery, you will not be allowed to drive home. You will need a responsible individual to drive you home and stay with you for 24 hours after surgery.   If you are taking public transportation, you will need to have a responsible individual with you.  Please call the Pre-admissions Testing Dept. at [phone] if you have any questions about these instructions.  Surgery Visitation Policy:  Patients having surgery or a procedure may have two visitors.  Children under the age of 103 must have an adult with them who is not the patient.  Inpatient Visitation:    Visiting hours are 7 a.m. to 8 p.m. Up to four visitors are allowed at one time in a patient room. The visitors may rotate out with other people during the day.  One visitor age 66 or older may stay with the patient overnight and must be in the room by 8 p.m.   Merchandiser, retail to address health-related social needs:  [email or web address]     Preparing for Surgery with CHLORHEXIDINE  GLUCONATE (CHG) Soap  Chlorhexidine  Gluconate (CHG) Soap  o An antiseptic cleaner that kills germs and bonds with the skin to continue killing germs even after washing  o Used for showering the night before surgery and morning of surgery  Before surgery, you can play an important role by reducing the number of germs on your skin.  CHG (Chlorhexidine  gluconate) soap is an antiseptic cleanser which kills germs and bonds with the skin to continue killing germs even after washing.  Please do not use if you have an allergy to CHG or antibacterial soaps. If your skin becomes reddened/irritated stop using the CHG.  1. Shower the NIGHT BEFORE SURGERY and the MORNING OF SURGERY with CHG soap.  2. If you choose to wash your hair, wash your hair first as usual  with your normal shampoo.  3. After shampooing, rinse your hair and body thoroughly to remove the shampoo.  4. Use CHG as you would any other liquid soap. You can apply CHG directly to the skin and wash gently with a scrungie or a clean washcloth.  5. Apply the CHG soap to your body only from the neck down. Do not use on open wounds or open sores. Avoid contact with your eyes, ears, mouth, and genitals (private parts). Wash face and genitals (private parts) with your normal soap.  6. Wash thoroughly, paying special attention to the area where your surgery will be performed.  7. Thoroughly rinse your body with warm water.  8. Do not shower/wash with your normal soap after using and rinsing off the CHG soap.  9. Pat yourself dry with a clean towel.  10. Wear clean pajamas to bed the night before surgery.  12. Place clean sheets on your bed the night of your first shower and do not sleep with pets.  13. Shower again with the CHG soap on the  day of surgery prior to arriving at the hospital.  14. Do not apply any deodorants/lotions/powders.  15. Please wear clean clothes to the hospital.

## 2023-07-27 ENCOUNTER — Encounter
Admission: RE | Admit: 2023-07-27 | Discharge: 2023-07-27 | Disposition: A | Discharge status: Home / Self Care | Source: Ambulatory Visit | Attending: Surgery | Admitting: Surgery

## 2023-07-27 ENCOUNTER — Encounter: Payer: Self-pay | Admitting: Urgent Care

## 2023-07-27 DIAGNOSIS — I1A0 Resistant hypertension: Secondary | ICD-10-CM | POA: Insufficient documentation

## 2023-07-27 DIAGNOSIS — Z01812 Encounter for preprocedural laboratory examination: Secondary | ICD-10-CM | POA: Insufficient documentation

## 2023-07-27 DIAGNOSIS — D508 Other iron deficiency anemias: Secondary | ICD-10-CM | POA: Diagnosis not present

## 2023-07-27 DIAGNOSIS — N186 End stage renal disease: Secondary | ICD-10-CM | POA: Diagnosis not present

## 2023-07-27 DIAGNOSIS — I120 Hypertensive chronic kidney disease with stage 5 chronic kidney disease or end stage renal disease: Secondary | ICD-10-CM | POA: Diagnosis not present

## 2023-07-27 HISTORY — DX: Anemia in chronic kidney disease: D63.1

## 2023-07-27 HISTORY — DX: Chronic kidney disease, stage 4 (severe): N18.4

## 2023-07-27 HISTORY — DX: Anemia in chronic kidney disease: N18.9

## 2023-07-27 HISTORY — DX: Secondary hyperparathyroidism of renal origin: N25.81

## 2023-07-27 HISTORY — DX: Iron deficiency anemia, unspecified: D50.9

## 2023-07-27 HISTORY — DX: Unilateral inguinal hernia, without obstruction or gangrene, not specified as recurrent: K40.90

## 2023-07-27 HISTORY — DX: Atherosclerosis of aorta: I70.0

## 2023-07-27 LAB — BASIC METABOLIC PANEL WITH GFR
Anion gap: 9 (ref 5–15)
BUN: 99 mg/dL — ABNORMAL HIGH (ref 6–20)
CO2: 20 mmol/L — ABNORMAL LOW (ref 22–32)
Calcium: 8.1 mg/dL — ABNORMAL LOW (ref 8.9–10.3)
Chloride: 111 mmol/L (ref 98–111)
Creatinine, Ser: 8.6 mg/dL — ABNORMAL HIGH (ref 0.61–1.24)
GFR, Estimated: 7 mL/min — ABNORMAL LOW (ref >60)
Glucose, Bld: 133 mg/dL — ABNORMAL HIGH (ref 70–99)
Potassium: 4.9 mmol/L (ref 3.5–5.1)
Sodium: 140 mmol/L (ref 135–145)

## 2023-07-27 LAB — CBC
HCT: 24.4 % — ABNORMAL LOW (ref 39.0–52.0)
Hemoglobin: 7.6 g/dL — ABNORMAL LOW (ref 13.0–17.0)
MCH: 28.7 pg (ref 26.0–34.0)
MCHC: 31.1 g/dL (ref 30.0–36.0)
MCV: 92.1 fL (ref 80.0–100.0)
Platelets: 187 K/uL (ref 150–400)
RBC: 2.65 MIL/uL — ABNORMAL LOW (ref 4.22–5.81)
RDW: 12.9 % (ref 11.5–15.5)
WBC: 4.9 K/uL (ref 4.0–10.5)
nRBC: 0 % (ref 0.0–0.2)

## 2023-07-27 NOTE — Progress Notes (Addendum)
  [facility] Perioperative Services: Pre-Admission/Anesthesia Testing  Abnormal Lab Notification   Date: 07/27/23  Name: Guy Seese MRN:   [id number]  Re: Abnormal labs noted during PAT appointment   Notified:    Provider Name Provider Role Notification Mode  Desiderio Schanz, MD  General Surgery (Surgeon) Routed and/or faxed via [facility] Dennise Capri, MD Nephrology Routed and/or faxed via [facility]   ABNORMAL LAB VALUE(S):   Lab Results  Component Value Date   BUN 99 (H) 07/27/2023   CREATININE 8.60 (H) 07/27/2023   GFRNONAA 7 (L) 07/27/2023   CALCIUM  8.1 (L) 07/27/2023   GLUCOSE 133 (H) 07/27/2023   Lab Results  Component Value Date   HGB 7.6 (L) 07/27/2023   HCT 24.4 (L) 07/27/2023   Clinical Information and Notes:  Fabion Gatson is scheduled for a HERNIORRHAPHY, INGUINAL, ROBOT-ASSISTED, LAPAROSCOPIC (Right); REPAIR, HERNIA, UMBILICAL, ADULT; LAPAROSCOPIC INSERTION CONTINUOUS AMBULATORY PERITONEAL DIALYSIS (CAPD) CATHETER on 08/03/2023.   Patient with a CKD-IV diagnosis. Initiation of hemodialysis was recommended in 05/2023, however patient declined citing that he was about to go on a trip to [place]. Patient has returned from trip at this point and is looking to start dialysis treatments. Given that patient is active and continues to work, he has elected to pursue peritoneal dialysis at this time rather than center hemodialysis. Pending PD catheter placement on 08/03/2023.   Renal function appears to be stable when checked over the course of the last month; creatinine range 8.39 - 8.68 mg/dL.   Ca2+ chronically low; has ranged from 7.7 - 8.8 mg/dL over the course of the last several years.   Hemoglobin low at 7.6 g/dL. Patient had anemia panel on 06/03/2023: folate 19.9 ng/mL (normal), iron  38 ug/dL (low), TIBC 698 ug/dL (low), iron  saturation 13% (low), ferritin 13 ng/mL (low), ARC 42.2 K/uL (normal), reticulocytes 1.9% (normal). Went on to have hepatitis  serologies on 06/07/2023: HCV (-), HBV sAG (-), HBV cAb (+).   Patient was transfused with 1 unit of PRBCs on 06/03/2023. Post transfusion Hgb improved to 9.2 g/dL on 94/71/7974. He has drifted back down to 7.6 g/dL today. Plans are for patient to start ESA with initiation of dialysis.   Making primary attending surgeon and nephrology aware of the above lab values from today. Patient would likely benefit from PRBC transfusion prior to upcoming procedures. Will solicit thoughts from patient's specialty providers regarding whether or not they would like for patient to receive additional PRBC transfusion prior to his upcoming surgery. If so, need to determine transfusional needs (1 vs 2 units) and timing of transfusion.   I can facilitate getting the orders being placed and getting the patient scheduled to come in for the transfusion based on directives from Dr. Desiderio and Dr. Dennise.  ADDENDUM 07/27/2023 @ 1615 PM: Received return communication from both Dr. Desiderio and Dr. Dennise regarding the patient's anemia.  Dr. Desiderio advising that patient's case is pending clearance from vascular in the setting of known intramural aortic thrombus.  Patient is pending MRA evaluation.  MD noted that surgery may need to be postponed pending MRA and subsequent clearance.  Both physicians agreed that patient should receive PRBC transfusion, however note that transfusion should occur closer to the time of surgery.  Will defer PRBC transfusion at this time pending further directives from surgery and/or nephrology.  Dorise Pereyra, MSN, APRN, FNP-C, CEN [facility]  Perioperative Services Nurse Practitioner Phone: [phone] Fax: [phone] 07/27/23 2:12 PM

## 2023-07-28 NOTE — Progress Notes (Addendum)
 Called Dorise Pereyra, FNP with [facility] Pre-admission testing in regards to abnormal labs during PAT appt. I left a message advising him that after speaking with Dr. Dennise that he would like to hold off on transfusing 1 unit of PRBCs until after the patient has MRI scheduled for 08/01/23. I did inform him that I am sending over an urgent referral for Hematology at this time.

## 2023-08-01 ENCOUNTER — Ambulatory Visit

## 2023-08-02 ENCOUNTER — Ambulatory Visit
Admission: RE | Admit: 2023-08-02 | Discharge: 2023-08-02 | Disposition: A | Discharge status: Home / Self Care | Source: Ambulatory Visit | Attending: Vascular Surgery | Admitting: Vascular Surgery

## 2023-08-02 DIAGNOSIS — I7100 Dissection of unspecified site of aorta: Secondary | ICD-10-CM | POA: Insufficient documentation

## 2023-08-04 ENCOUNTER — Telehealth: Payer: Self-pay | Admitting: Surgery

## 2023-08-04 NOTE — Telephone Encounter (Signed)
 Updated information regarding rescheduled surgery.  Patient has been advised of Pre-Admission date/time, and Surgery date at [facility].  Surgery Date: 08/10/23 Preadmission Testing Date: 07/27/23 (phone 8a-1p)  Patient informed of the updated information regarding rescheduled surgery.   Patient has been made aware to call [phone], between 1-3:00pm the day before surgery, to find out what time to arrive for surgery.

## 2023-08-09 ENCOUNTER — Ambulatory Visit: Payer: Self-pay | Admitting: Oncology

## 2023-08-09 ENCOUNTER — Inpatient Hospital Stay: Attending: Oncology | Admitting: Oncology

## 2023-08-09 ENCOUNTER — Encounter: Payer: Self-pay | Admitting: Oncology

## 2023-08-09 ENCOUNTER — Encounter: Payer: Self-pay | Admitting: Surgery

## 2023-08-09 ENCOUNTER — Inpatient Hospital Stay

## 2023-08-09 VITALS — BP 134/92 | HR 70 | Temp 97.5°F | Resp 18 | Wt 146.0 lb

## 2023-08-09 DIAGNOSIS — N185 Chronic kidney disease, stage 5: Secondary | ICD-10-CM | POA: Insufficient documentation

## 2023-08-09 DIAGNOSIS — I120 Hypertensive chronic kidney disease with stage 5 chronic kidney disease or end stage renal disease: Secondary | ICD-10-CM | POA: Insufficient documentation

## 2023-08-09 DIAGNOSIS — D631 Anemia in chronic kidney disease: Secondary | ICD-10-CM | POA: Diagnosis present

## 2023-08-09 DIAGNOSIS — Z809 Family history of malignant neoplasm, unspecified: Secondary | ICD-10-CM | POA: Insufficient documentation

## 2023-08-09 LAB — RETIC PANEL
Immature Retic Fract: 10.5 % (ref 2.3–15.9)
RBC.: 2.67 MIL/uL — ABNORMAL LOW (ref 4.22–5.81)
Retic Count, Absolute: 30.7 K/uL (ref 19.0–186.0)
Retic Ct Pct: 1.2 % (ref 0.4–3.1)
Reticulocyte Hemoglobin: 33.6 pg (ref >27.9)

## 2023-08-09 LAB — CBC WITH DIFFERENTIAL/PLATELET
Abs Immature Granulocytes: 0.01 K/uL (ref 0.00–0.07)
Basophils Absolute: 0 K/uL (ref 0.0–0.1)
Basophils Relative: 0 %
Eosinophils Absolute: 0.1 K/uL (ref 0.0–0.5)
Eosinophils Relative: 3 %
HCT: 24.1 % — ABNORMAL LOW (ref 39.0–52.0)
Hemoglobin: 7.7 g/dL — ABNORMAL LOW (ref 13.0–17.0)
Immature Granulocytes: 0 %
Lymphocytes Relative: 20 %
Lymphs Abs: 0.8 K/uL (ref 0.7–4.0)
MCH: 29.2 pg (ref 26.0–34.0)
MCHC: 32 g/dL (ref 30.0–36.0)
MCV: 91.3 fL (ref 80.0–100.0)
Monocytes Absolute: 0.3 K/uL (ref 0.1–1.0)
Monocytes Relative: 8 %
Neutro Abs: 2.9 K/uL (ref 1.7–7.7)
Neutrophils Relative %: 69 %
Platelets: 157 K/uL (ref 150–400)
RBC: 2.64 MIL/uL — ABNORMAL LOW (ref 4.22–5.81)
RDW: 13.3 % (ref 11.5–15.5)
WBC: 4.2 K/uL (ref 4.0–10.5)
nRBC: 0 % (ref 0.0–0.2)

## 2023-08-09 LAB — FERRITIN: Ferritin: 12 ng/mL — ABNORMAL LOW (ref 24–336)

## 2023-08-09 LAB — TYPE AND SCREEN
ABO/RH(D): B POS
Antibody Screen: NEGATIVE

## 2023-08-09 LAB — IRON AND TIBC
Iron: 94 ug/dL (ref 45–182)
Saturation Ratios: 25 % (ref 17.9–39.5)
TIBC: 375 ug/dL (ref 250–450)
UIBC: 281 ug/dL

## 2023-08-09 MED ORDER — ORAL CARE MOUTH RINSE: 15.0000 mL | Freq: Once | OROMUCOSAL | Status: DC

## 2023-08-09 MED ORDER — CHLORHEXIDINE GLUCONATE CLOTH 2 % EX PADS: 6.0000 | MEDICATED_PAD | Freq: Once | CUTANEOUS | Status: DC

## 2023-08-09 MED ORDER — SODIUM CHLORIDE 0.9 % IV SOLN: INTRAVENOUS | Status: DC

## 2023-08-09 MED ORDER — GABAPENTIN 100 MG PO CAPS: 100.0000 mg | ORAL_CAPSULE | ORAL | Status: DC

## 2023-08-09 MED ORDER — CEFAZOLIN SODIUM-DEXTROSE 2-4 GM/100ML-% IV SOLN: 2.0000 g | INTRAVENOUS | Status: DC

## 2023-08-09 MED ORDER — CHLORHEXIDINE GLUCONATE 0.12 % MT SOLN: 15.0000 mL | Freq: Once | OROMUCOSAL | Status: DC

## 2023-08-09 MED ORDER — ACETAMINOPHEN 500 MG PO TABS: 1000.0000 mg | ORAL_TABLET | ORAL | Status: DC

## 2023-08-09 MED ORDER — SODIUM CHLORIDE 0.9 % IV SOLN: 10.0000 mL/h | Freq: Once | INTRAVENOUS | Status: DC

## 2023-08-09 NOTE — Assessment & Plan Note (Signed)
 Labs are reviewed and discussed with patient. Lab Results  Component Value Date   HGB 7.7 (L) 08/09/2023   TIBC 375 08/09/2023   IRONPCTSAT 25 08/09/2023   FERRITIN 12 (L) 08/09/2023    Recommend IV Venofer weekly x 4 to further improve iron   stores. If hemoglobin is persistently low despite improved iron  stores, consider erythropoietin therapy.  I discussed about the potential risks of IV Venofer treatments, including but not limited to allergic reactions/infusion reactions including anaphylactic reactions, diarrhea, phlebitis, high blood pressure, wheezing, SOB, skin rash, weight gain,dark urine, leg swelling, back pain, headache, nausea and fatigue, etc. Plan IV venofer weekly x 4

## 2023-08-09 NOTE — Progress Notes (Signed)
 Hematology/Oncology Consult note [phone] [phone]        REFERRING PROVIDER: Manya Toribio SQUIBB, PA   CHIEF COMPLAINTS/REASON FOR VISIT:  Evaluation of anemia in chronic kidney disease   ASSESSMENT & PLAN:   Anemia in stage 5 chronic kidney disease, not on chronic dialysis [facility]) Labs are reviewed and discussed with patient. Lab Results  Component Value Date   HGB 7.7 (L) 08/09/2023   TIBC 375 08/09/2023   IRONPCTSAT 25 08/09/2023   FERRITIN 12 (L) 08/09/2023    Recommend IV Venofer weekly x 4 to further improve iron   stores. If hemoglobin is persistently low despite improved iron  stores, consider erythropoietin therapy.  I discussed about the potential risks of IV Venofer treatments, including but not limited to allergic reactions/infusion reactions including anaphylactic reactions, diarrhea, phlebitis, high blood pressure, wheezing, SOB, skin rash, weight gain,dark urine, leg swelling, back pain, headache, nausea and fatigue, etc. Plan IV venofer weekly x 4    Orders Placed This Encounter  Procedures   CBC with Differential/Platelet    Standing Status:   Future    Number of Occurrences:   1    Expected Date:   08/09/2023    Expiration Date:   11/07/2023   Iron  and TIBC    Standing Status:   Future    Number of Occurrences:   1    Expected Date:   08/09/2023    Expiration Date:   11/07/2023   Ferritin    Standing Status:   Future    Number of Occurrences:   1    Expected Date:   08/09/2023    Expiration Date:   11/07/2023   Multiple Myeloma Panel (SPEP&IFE w/QIG)    Standing Status:   Future    Number of Occurrences:   1    Expected Date:   08/09/2023    Expiration Date:   11/07/2023   Kappa/lambda light chains    Standing Status:   Future    Number of Occurrences:   1    Expected Date:   08/09/2023    Expiration Date:   11/07/2023   Retic Panel    Standing Status:   Future    Number of Occurrences:   1    Expected Date:   08/09/2023     Expiration Date:   11/07/2023   CBC (Cancer Center Only)    Standing Status:   Future    Expected Date:   10/10/2023    Expiration Date:   01/08/2024   Iron  and TIBC    Standing Status:   Future    Expected Date:   10/10/2023    Expiration Date:   01/08/2024   Ferritin    Standing Status:   Future    Expected Date:   10/10/2023    Expiration Date:   01/08/2024   Retic Panel    Standing Status:   Future    Expected Date:   10/10/2023    Expiration Date:   01/08/2024   Type and screen         Standing Status:   Future    Number of Occurrences:   1    Expected Date:   08/09/2023    Expiration Date:   08/08/2024   Recommend patient to follow-up in 3 months for reevaluation. All questions were answered. The patient knows to call the clinic with any problems, questions or concerns.  Zelphia Cap, MD, PhD [facility] Hematology Oncology 08/09/2023   HISTORY OF  PRESENTING ILLNESS:   Trevor Meyer is a  58 y.o.  male with PMH listed below was seen in consultation at the request of  Manya Toribio SQUIBB, [place]  for evaluation of anemia in chronic kidney disease.  Patient follows up with nephrology for chronic kidney disease, stage V.  He is going to get peritoneal dialysis catheter placed tomorrow. He was found to have a hemoglobin of 7.6 on 07/27/2023.  Referred to hematology for evaluation and management of chronic anemia.  Patient denies any rectal bleeding, change of bowel habits.  He is not on any blood thinners. Dark stool which he attributes to oral iron  supplementation. 06/03/2023, iron  panel showed ferritin of 13, iron  saturation of 13.  He was recommended to start oral iron  supplementation since then.   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 No pertinent surgical history.  SOCIAL HISTORY: Social History   Socioeconomic History   Marital status: Married    Spouse name: Bui,kim (Spouse)   Number of children: 1   Years of education: Not on file   Highest education level: Some college, no degree  Occupational History   Not on file  Tobacco Use   Smoking status: Never    Passive exposure: Never   Smokeless tobacco: Never  Vaping Use   Vaping status: Never Used  Substance and Sexual Activity   Alcohol  use: Never   Drug use: Never   Sexual activity: Yes  Other Topics Concern   Not on file  Social History Narrative   Not on file   Social Drivers of Health   Financial Resource Strain: Medium Risk (05/31/2023)   Overall Financial Resource Strain (CARDIA)    Difficulty of Paying Living Expenses: Somewhat hard  Food Insecurity: No Food Insecurity (06/02/2023)   Hunger Vital Sign    Worried About Running Out of Food in the Last Year: Never true    Ran Out of Food in the Last Year: Never true  Recent Concern: Food Insecurity - Food Insecurity Present (05/31/2023)   Hunger Vital Sign    Worried About Running Out of Food in the Last Year: Sometimes true    Ran Out of Food in the Last Year: Sometimes true  Transportation Needs: No Transportation Needs (06/02/2023)   PRAPARE - Administrator, Civil Service (Medical): No    Lack of Transportation (Non-Medical): No  Physical Activity: Sufficiently Active (05/31/2023)   Exercise Vital Sign    Days of Exercise per Week: 5 days    Minutes of Exercise per Session: 50 min  Stress: Stress Concern Present (05/31/2023)   [employer] of  Occupational Health - Occupational Stress Questionnaire    Feeling of Stress : To some extent  Social Connections: Moderately Integrated (05/31/2023)   Social Connection and Isolation Panel    Frequency of Communication with Friends and Family: More than three times  a week    Frequency of Social Gatherings with Friends and Family: Once a week    Attends Religious Services: More than 4 times per year    Active Member of [employer] or Organizations: No    Attends Engineer, structural: Not on file    Marital Status: Married  Catering manager Violence: Not At Risk (06/03/2023)   Humiliation, Afraid, Rape, and Kick questionnaire    Fear of Current or Ex-Partner: No    Emotionally Abused: No    Physically Abused: No    Sexually Abused: No    FAMILY HISTORY: Family History  Problem Relation Age of Onset   Cancer Mother    Stroke Father     ALLERGIES:  is allergic to hydralazine .  MEDICATIONS:  Current Outpatient Medications  Medication Sig Dispense Refill   amLODipine  (NORVASC ) 10 MG tablet Take 1 tablet (10 mg total) by mouth daily. 30 tablet 0   cloNIDine  (CATAPRES ) 0.1 MG tablet Take 1 tablet (0.1 mg total) by mouth 2 (two) times daily. 60 tablet 11   furosemide  (LASIX ) 40 MG tablet Take 1 tablet (40 mg total) by mouth daily. 30 tablet 2   iron  polysaccharides (NIFEREX) 150 MG capsule Take 1 capsule (150 mg total) by mouth daily. 30 capsule 2   isosorbide  mononitrate (IMDUR ) 30 MG 24 hr tablet Take 1 tablet (30 mg total) by mouth daily. 30 tablet 0   labetalol  (NORMODYNE ) 200 MG tablet Take 1 tablet (200 mg total) by mouth 2 (two) times daily. 60 tablet 0   pantoprazole  (PROTONIX ) 40 MG tablet Take 1 tablet (40 mg total) by mouth daily. 30 tablet 2   Propylene Glycol (LUBRICANT EYE DROP) 0.6 % SOLN Place 1-2 drops into both eyes 3 (three) times daily as needed (dry/irritated eyes.).     Ascorbic Acid (VITAMIN C PO) Take 1 tablet by mouth in the morning. (Patient not taking:  Reported on 08/09/2023)     B Complex-C (B-COMPLEX WITH VITAMIN C) tablet Take 1 tablet by mouth daily in the afternoon. (Patient not taking: Reported on 08/09/2023)     MAGNESIUM  PO Take 1 capsule by mouth in the morning. (Patient not taking: Reported on 08/09/2023)     Multiple Vitamins-Minerals (ZINC PO) Take 1 tablet by mouth in the morning. (Patient not taking: Reported on 08/09/2023)     Omega-3 Fatty Acids (FISH OIL) 1000 MG CAPS Take 1 capsule by mouth at bedtime. (Patient not taking: Reported on 08/09/2023)     sodium bicarbonate  650 MG tablet Take 1 tablet (650 mg total) by mouth 3 (three) times daily. (Patient not taking: Reported on 08/09/2023) 90 tablet 2   No current facility-administered medications for this visit.    Review of Systems  Constitutional:  Negative for appetite change, chills, fever and unexpected weight change.  HENT:   Negative for hearing loss and voice change.   Eyes:  Negative for eye problems and icterus.  Respiratory:  Negative for chest tightness, cough and shortness of breath.   Cardiovascular:  Negative for chest pain and leg swelling.  Gastrointestinal:  Negative for abdominal distention and abdominal pain.  Endocrine: Negative for hot flashes.  Genitourinary:  Negative for difficulty urinating, dysuria and frequency.   Musculoskeletal:  Negative for arthralgias.  Skin:  Negative for itching and rash.  Neurological:  Negative for light-headedness and numbness.  Hematological:  Negative for adenopathy. Does not bruise/bleed easily.  Psychiatric/Behavioral:  Negative for confusion.    PHYSICAL EXAMINATION:  Vitals:  08/09/23 1507 08/09/23 1520  BP: (!) 151/81 (!) 134/92  Pulse: 70   Resp: 18   Temp: (!) 97.5 F (36.4 C)   SpO2: 100%    Filed Weights   08/09/23 1507  Weight: 146 lb (66.2 kg)    Physical Exam Constitutional:      General: He is not in acute distress. HENT:     Head: Normocephalic and atraumatic.  Eyes:     General: No  scleral icterus. Cardiovascular:     Rate and Rhythm: Normal rate and regular rhythm.     Heart sounds: Normal heart sounds.  Pulmonary:     Effort: Pulmonary effort is normal. No respiratory distress.     Breath sounds: No wheezing.  Abdominal:     General: Bowel sounds are normal. There is no distension.     Palpations: Abdomen is soft.  Musculoskeletal:        General: No deformity. Normal range of motion.     Cervical back: Normal range of motion and neck supple.  Skin:    General: Skin is warm and dry.     Coloration: Skin is pale.     Findings: No erythema or rash.  Neurological:     Mental Status: He is alert and oriented to person, place, and time. Mental status is at baseline.  Psychiatric:        Mood and Affect: Mood normal.     LABORATORY DATA:  I have reviewed the data as listed    Latest Ref Rng & Units 08/09/2023    3:48 PM 07/27/2023    9:16 AM 06/05/2023    5:43 AM  CBC  WBC 4.0 - 10.5 K/uL 4.2  4.9  4.3   Hemoglobin 13.0 - 17.0 g/dL 7.7  7.6  8.3   Hematocrit 39.0 - 52.0 % 24.1  24.4  25.1   Platelets 150 - 400 K/uL 157  187  147       Latest Ref Rng & Units 07/27/2023    9:16 AM 06/05/2023    5:43 AM 06/04/2023    5:39 AM  CMP  Glucose 70 - 99 mg/dL 866  93  893   BUN 6 - 20 mg/dL 99  95  91   Creatinine 0.61 - 1.24 mg/dL 1.39  1.54  1.46   Sodium 135 - 145 mmol/L 140  142  144   Potassium 3.5 - 5.1 mmol/L 4.9  4.9  5.0   Chloride 98 - 111 mmol/L 111  113  114   CO2 22 - 32 mmol/L 20  20  21    Calcium  8.9 - 10.3 mg/dL 8.1  7.6  7.8       RADIOGRAPHIC STUDIES: I have personally reviewed the radiological images as listed and agreed with the findings in the report. MR ANGIO CHEST WO CONTRAST Result Date: 08/07/2023 CLINICAL DATA:  Thoracic aortic disease EXAM: MRA CHEST WITH OR WITHOUT CONTRAST TECHNIQUE: Angiographic images of the chest were obtained using MRA technique without intravenous contrast. CONTRAST:  None. COMPARISON:  CT angiogram of the  chest performed March 21, 2020 FINDINGS: VASCULAR Aorta: Using axial imaging, the tubular ascending thoracic aorta is estimated at 4.1 x 4.1 cm. The aortic arch is estimated at 3.8 by 4.2 cm. The proximal descending thoracic aorta is estimated at 2.8 cm. The distal descending thoracic aorta is estimated at 2.4 cm. Previously observed intramural hematoma involving the thoracic aorta is significantly improved. There is very minimal wall thickening of the  left lateral wall of the descending thoracic aorta within estimated thickness of 4 mm at that site, previously measuring in excess of 7 mm. Heart: Enlarged heart, no pericardial effusion. Pulmonary Arteries:  Within normal limits. Other: Polycystic kidneys.  Small liver cysts. NON-VASCULAR Spinal cord: Nothing significant. Brachial plexus: Nothing significant. Muscles and tendons: Nothing significant. Bones: Nothing significant. Joints: Nothing significant. IMPRESSION: 1. Evolution of previously observed aortic findings. The tubular ascending thoracic aorta is estimated at 4.1 cm in diameter. The aortic arch is estimated at 4.2 cm. The proximal descending thoracic aorta returns to a normal caliber. There is some residual wall thickening in the mid descending thoracic aorta estimated at 4 mm in greatest thickness (previously 7 mm). Recommend semi-annual imaging followup by CTA or MRA and referral to cardiothoracic surgery if not already obtained. This recommendation follows 2010 ACCF/AHA/AATS/ACR/ASA/SCA/SCAI/SIR/STS/SVM Guidelines for the Diagnosis and Management of Patients With Thoracic Aortic Disease. Circulation. 2010; 121: Z733-z63. Aortic aneurysm NOS (ICD10-I71.9) Electronically Signed   By: Maude Naegeli M.D.   On: 08/07/2023 08:27   US  Renal Result Date: 06/02/2023 CLINICAL DATA:  Kidney failure EXAM: RENAL / URINARY TRACT ULTRASOUND COMPLETE COMPARISON:  CTA 03/22/2020. FINDINGS: Right Kidney: Renal measurements: 16.6 x 9.8 x 9.3 cm = volume: 72.7 mL.  Parenchymal atrophy with extensive cystic changes seen throughout the renal parenchyma. Dominant foci measure up to 4.8 cm and 4.0 cm but there are numerable foci identified. These were seen on previous CTA as well. Please correlate with clinical history. No obvious collecting system dilatation. Left Kidney: Renal measurements: 14.0 x 9.2 x 10.2 cm = volume: 685.0 mL. Parenchymal atrophy with once again extensive cystic foci throughout the renal parenchyma. Again these measure up to 5 cm and 3.9 cm. Numerous smaller foci identified as well. No separate collecting system dilatation clearly seen. Bladder: Slight wall thickening of the urinary bladder. Other: None. IMPRESSION: Innumerable bilateral renal cystic foci. Vast majority appears simple. No separate collecting system dilatation. Electronically Signed   By: Ranell Bring M.D.   On: 06/02/2023 17:42

## 2023-08-09 NOTE — Progress Notes (Addendum)
 Perioperative Services Pre-Admission/Anesthesia Testing    Date: 08/09/23  Name: Trevor Meyer DOB: 1965/12/05 MRN:   [id number]  Re: Plan for surgery; vascular clearance and PRBC transfusion  Planned Surgical Procedure(s):     Case: [id number] Date/Time: 08/10/23 9078   Procedures:      HERNIORRHAPHY, INGUINAL, ROBOT-ASSISTED, LAPAROSCOPIC (Right)     REPAIR, HERNIA, UMBILICAL, ADULT     LAPAROSCOPIC INSERTION CONTINUOUS AMBULATORY PERITONEAL DIALYSIS  (CAPD) CATHETER   Anesthesia type: General   Diagnosis:      Right inguinal hernia [K40.90]     Umbilical hernia without obstruction and without gangrene [K42.9]     End stage renal disease (HCC) [N18.6]   Pre-op diagnosis:      right inguinal hernia     umbilical hernia      ESRD   Location: [facility] OR ROOM 06 / [facility]   Surgeons: Desiderio Schanz, MD        Clinical Notes:  Patient is scheduled for the above procedure on 08/10/2023 with Dr. Elnoria Desiderio, MD. Procedure previously scheduled last week, however due to pending vascular clearance, procedure was rescheduled.  MRA was performed on 08/02/2023.  Previously noted intramural hematoma involving the thoracic aorta noted to be significantly improved.  Imaging reviewed with vascular APP, who in turn spoke with Dr. Marea (vascular surgery).  Based on their independent review of the imaging and radiology report, no aneurysmal degeneration was noted.  Vascular advising that patient is cleared to proceed with the planned surgical procedures at an overall ACCEPTABLE risk.  Regarding patient's anemia.  Preoperative labs on 07/27/2023 revealed a hemoglobin of 7.6 g/dL.  Reached out to nephrology and surgery to discuss.  Patient with anemia of chronic renal failure.  Plans are for patient to start ESA therapy with initiation of dialysis.  Nephrology sent a urgent referral to hematology for further evaluation and treatment considerations.  Patient was contacted on the day  that the referral was sent, however patient verbalized that he wished to defer consult until after surgery.  In efforts to follow-up on status of referral, I reached out to hematology on 08/08/2023 and learned this information.  I made them aware that patient needed to be seen soon as possible for consult and optimization of his anemia.  Patient was grossly scheduled for 08/09/2023 at 1500 PM.   Reached out to patient's primary attending surgeon Letta, MD) to make him aware of the aforementioned.  Reviewed plans for surgery with regards to patient's anemia.  Reminded MD that nephrology had requested that patient be transfused prior to his surgery.  As of noon today (08/09/2023), patient is scheduled for a 0930 OR time.  Discussed potentially having patient arrive at 0600 AM for preoperative blood transfusion; surgeon in agreement with proposed plan. Spoke with SDS charge nurse Joshua, RN) to make her aware of transfusional needs.   In efforts to avoid foreseeable delays tomorrow, I will coordinate with the cancer center to collect a T&S tube for processing today; already receiving new patient labs for Dr. Babara. I will enter orders for the preoperative transfusion and release so that blood bank can have the unit ready and available when the patient arrives; plans are for 1 unit PRBCs tomorrow (08/10/2023). SDS is aware of plans.   Dorise Pereyra, MSN, APRN, FNP-C, CEN [facility]  Perioperative Services Nurse Practitioner Phone: [phone] Fax: [phone] 08/09/23 12:38 PM  NOTE: This note has been prepared using Dragon dictation software.  Despite my best ability to proofread, there is always the potential that unintentional transcriptional errors may still occur from this process.

## 2023-08-10 ENCOUNTER — Encounter
Admission: AD | Disposition: A | Discharge status: Home / Self Care | Payer: Self-pay | Source: Home / Self Care | Attending: Internal Medicine

## 2023-08-10 ENCOUNTER — Encounter: Payer: Self-pay | Admitting: Urgent Care

## 2023-08-10 ENCOUNTER — Inpatient Hospital Stay

## 2023-08-10 ENCOUNTER — Encounter: Payer: Self-pay | Admitting: Surgery

## 2023-08-10 ENCOUNTER — Other Ambulatory Visit: Payer: Self-pay

## 2023-08-10 ENCOUNTER — Observation Stay
Admission: AD | Admit: 2023-08-10 | Discharge: 2023-08-12 | Disposition: A | Discharge status: Home / Self Care | Attending: Internal Medicine | Admitting: Internal Medicine

## 2023-08-10 DIAGNOSIS — E875 Hyperkalemia: Secondary | ICD-10-CM | POA: Diagnosis present

## 2023-08-10 DIAGNOSIS — I7401 Saddle embolus of abdominal aorta: Secondary | ICD-10-CM | POA: Insufficient documentation

## 2023-08-10 DIAGNOSIS — Z4902 Encounter for fitting and adjustment of peritoneal dialysis catheter: Secondary | ICD-10-CM | POA: Diagnosis not present

## 2023-08-10 DIAGNOSIS — N186 End stage renal disease: Principal | ICD-10-CM | POA: Insufficient documentation

## 2023-08-10 DIAGNOSIS — N189 Chronic kidney disease, unspecified: Secondary | ICD-10-CM

## 2023-08-10 DIAGNOSIS — D509 Iron deficiency anemia, unspecified: Secondary | ICD-10-CM

## 2023-08-10 DIAGNOSIS — K429 Umbilical hernia without obstruction or gangrene: Secondary | ICD-10-CM | POA: Diagnosis not present

## 2023-08-10 DIAGNOSIS — Z418 Encounter for other procedures for purposes other than remedying health state: Secondary | ICD-10-CM | POA: Insufficient documentation

## 2023-08-10 DIAGNOSIS — E8722 Chronic metabolic acidosis: Secondary | ICD-10-CM | POA: Insufficient documentation

## 2023-08-10 DIAGNOSIS — I10 Essential (primary) hypertension: Secondary | ICD-10-CM | POA: Diagnosis present

## 2023-08-10 DIAGNOSIS — D631 Anemia in chronic kidney disease: Secondary | ICD-10-CM | POA: Insufficient documentation

## 2023-08-10 DIAGNOSIS — N185 Chronic kidney disease, stage 5: Secondary | ICD-10-CM | POA: Diagnosis present

## 2023-08-10 DIAGNOSIS — I7410 Embolism and thrombosis of unspecified parts of aorta: Secondary | ICD-10-CM

## 2023-08-10 DIAGNOSIS — I120 Hypertensive chronic kidney disease with stage 5 chronic kidney disease or end stage renal disease: Secondary | ICD-10-CM | POA: Diagnosis not present

## 2023-08-10 DIAGNOSIS — K4090 Unilateral inguinal hernia, without obstruction or gangrene, not specified as recurrent: Secondary | ICD-10-CM | POA: Diagnosis not present

## 2023-08-10 DIAGNOSIS — Z8271 Family history of polycystic kidney: Secondary | ICD-10-CM | POA: Insufficient documentation

## 2023-08-10 DIAGNOSIS — Z01812 Encounter for preprocedural laboratory examination: Secondary | ICD-10-CM

## 2023-08-10 HISTORY — DX: Chronic kidney disease, stage 5: N18.5

## 2023-08-10 HISTORY — DX: Umbilical hernia without obstruction or gangrene: K42.9

## 2023-08-10 LAB — POTASSIUM
Potassium: 5.6 mmol/L — ABNORMAL HIGH (ref 3.5–5.1)
Potassium: 4.9 mmol/L (ref 3.5–5.1)
Potassium: 5.3 mmol/L — ABNORMAL HIGH (ref 3.5–5.1)
Potassium: 5.7 mmol/L — ABNORMAL HIGH (ref 3.5–5.1)

## 2023-08-10 LAB — POCT I-STAT, CHEM 8
BUN: 93 mg/dL — ABNORMAL HIGH (ref 6–20)
BUN: 94 mg/dL — ABNORMAL HIGH (ref 6–20)
Calcium, Ion: 1.12 mmol/L — ABNORMAL LOW (ref 1.15–1.40)
Calcium, Ion: 1.13 mmol/L — ABNORMAL LOW (ref 1.15–1.40)
Chloride: 112 mmol/L — ABNORMAL HIGH (ref 98–111)
Chloride: 113 mmol/L — ABNORMAL HIGH (ref 98–111)
Creatinine, Ser: 9.7 mg/dL — ABNORMAL HIGH (ref 0.61–1.24)
Creatinine, Ser: 9.8 mg/dL — ABNORMAL HIGH (ref 0.61–1.24)
Glucose, Bld: 102 mg/dL — ABNORMAL HIGH (ref 70–99)
Glucose, Bld: 102 mg/dL — ABNORMAL HIGH (ref 70–99)
HCT: 22 % — ABNORMAL LOW (ref 39.0–52.0)
HCT: 22 % — ABNORMAL LOW (ref 39.0–52.0)
Hemoglobin: 7.5 g/dL — ABNORMAL LOW (ref 13.0–17.0)
Hemoglobin: 7.5 g/dL — ABNORMAL LOW (ref 13.0–17.0)
Potassium: 5.5 mmol/L — ABNORMAL HIGH (ref 3.5–5.1)
Potassium: 5.5 mmol/L — ABNORMAL HIGH (ref 3.5–5.1)
Sodium: 143 mmol/L (ref 135–145)
Sodium: 143 mmol/L (ref 135–145)
TCO2: 19 mmol/L — ABNORMAL LOW (ref 22–32)
TCO2: 19 mmol/L — ABNORMAL LOW (ref 22–32)

## 2023-08-10 LAB — PREPARE RBC (CROSSMATCH)

## 2023-08-10 LAB — BLOOD GAS, VENOUS
Acid-base deficit: 7.9 mmol/L — ABNORMAL HIGH (ref 0.0–2.0)
Bicarbonate: 18.8 mmol/L — ABNORMAL LOW (ref 20.0–28.0)
O2 Saturation: 80.5 %
Patient temperature: 37
pCO2, Ven: 42 mmHg — ABNORMAL LOW (ref 44–60)
pH, Ven: 7.26 (ref 7.25–7.43)
pO2, Ven: 49 mmHg — ABNORMAL HIGH (ref 32–45)

## 2023-08-10 LAB — GLUCOSE, CAPILLARY
Glucose-Capillary: 170 mg/dL — ABNORMAL HIGH (ref 70–99)
Glucose-Capillary: 117 mg/dL — ABNORMAL HIGH (ref 70–99)
Glucose-Capillary: 107 mg/dL — ABNORMAL HIGH (ref 70–99)

## 2023-08-10 LAB — HEMOGLOBIN AND HEMATOCRIT, BLOOD
HCT: 28.3 % — ABNORMAL LOW (ref 39.0–52.0)
Hemoglobin: 9 g/dL — ABNORMAL LOW (ref 13.0–17.0)

## 2023-08-10 LAB — HEPATITIS B SURFACE ANTIGEN: Hepatitis B Surface Ag: NONREACTIVE

## 2023-08-10 LAB — KAPPA/LAMBDA LIGHT CHAINS
Kappa free light chain: 126.5 mg/L — ABNORMAL HIGH (ref 3.3–19.4)
Kappa, lambda light chain ratio: 1.25 (ref 0.26–1.65)
Lambda free light chains: 101.2 mg/L — ABNORMAL HIGH (ref 5.7–26.3)

## 2023-08-10 SURGERY — HERNIORRHAPHY, INGUINAL, ROBOT-ASSISTED, LAPAROSCOPIC
Anesthesia: General | Laterality: Right

## 2023-08-10 MED ORDER — ACETAMINOPHEN 650 MG RE SUPP: 650.0000 mg | Freq: Four times a day (QID) | RECTAL | Status: DC | PRN

## 2023-08-10 MED ORDER — ALBUTEROL SULFATE (2.5 MG/3ML) 0.083% IN NEBU
2.5000 mg | INHALATION_SOLUTION | RESPIRATORY_TRACT | Status: DC | PRN
  Filled 2023-08-10: qty 3

## 2023-08-10 MED ORDER — AMLODIPINE BESYLATE 10 MG PO TABS
10.0000 mg | ORAL_TABLET | Freq: Every day | ORAL | Status: DC
  Administered 2023-08-10 – 2023-08-12 (×2): 10 mg via ORAL
  Filled 2023-08-10: qty 1
  Filled 2023-08-10 (×2): qty 2

## 2023-08-10 MED ORDER — CHLORHEXIDINE GLUCONATE 0.12 % MT SOLN
OROMUCOSAL | Status: AC
Start: 2023-08-10 — End: 2023-08-10
  Filled 2023-08-10: qty 15

## 2023-08-10 MED ORDER — CEFAZOLIN SODIUM-DEXTROSE 2-4 GM/100ML-% IV SOLN
INTRAVENOUS | Status: AC
  Filled 2023-08-10: qty 100

## 2023-08-10 MED ORDER — INSULIN ASPART 100 UNIT/ML IJ SOLN
0.0000 [IU] | Freq: Three times a day (TID) | INTRAMUSCULAR | Status: DC
  Administered 2023-08-10: 1 [IU] via SUBCUTANEOUS
  Filled 2023-08-10 (×2): qty 1

## 2023-08-10 MED ORDER — CLONIDINE HCL 0.1 MG PO TABS
0.1000 mg | ORAL_TABLET | Freq: Two times a day (BID) | ORAL | Status: DC
  Administered 2023-08-10 – 2023-08-12 (×3): 0.1 mg via ORAL
  Filled 2023-08-10 (×4): qty 1

## 2023-08-10 MED ORDER — PROPYLENE GLYCOL 0.6 % OP SOLN: 1.0000 [drp] | Freq: Three times a day (TID) | OPHTHALMIC | Status: DC | PRN

## 2023-08-10 MED ORDER — FUROSEMIDE 40 MG PO TABS
40.0000 mg | ORAL_TABLET | Freq: Every day | ORAL | Status: DC
  Administered 2023-08-10 – 2023-08-12 (×2): 40 mg via ORAL
  Filled 2023-08-10: qty 2
  Filled 2023-08-10: qty 1
  Filled 2023-08-10: qty 2

## 2023-08-10 MED ORDER — ONDANSETRON HCL 4 MG PO TABS: 4.0000 mg | ORAL_TABLET | Freq: Four times a day (QID) | ORAL | Status: DC | PRN

## 2023-08-10 MED ORDER — ACETAMINOPHEN 500 MG PO TABS
ORAL_TABLET | ORAL | Status: AC
  Filled 2023-08-10: qty 2

## 2023-08-10 MED ORDER — SODIUM ZIRCONIUM CYCLOSILICATE 10 G PO PACK
10.0000 g | PACK | Freq: Two times a day (BID) | ORAL | Status: AC
  Administered 2023-08-10: 10 g via ORAL
  Filled 2023-08-10 (×2): qty 1

## 2023-08-10 MED ORDER — SODIUM ZIRCONIUM CYCLOSILICATE 10 G PO PACK
10.0000 g | PACK | Freq: Two times a day (BID) | ORAL | Status: AC
  Administered 2023-08-10 (×2): 10 g via ORAL
  Filled 2023-08-10 (×2): qty 1

## 2023-08-10 MED ORDER — GABAPENTIN 100 MG PO CAPS
ORAL_CAPSULE | ORAL | Status: AC
  Filled 2023-08-10: qty 1

## 2023-08-10 MED ORDER — ACETAMINOPHEN 325 MG PO TABS
650.0000 mg | ORAL_TABLET | Freq: Four times a day (QID) | ORAL | Status: DC | PRN
Start: 2023-08-10 — End: 2023-08-13
  Administered 2023-08-11: 650 mg via ORAL
  Filled 2023-08-10: qty 2

## 2023-08-10 MED ORDER — INSULIN ASPART 100 UNIT/ML IJ SOLN: 0.0000 [IU] | Freq: Every day | INTRAMUSCULAR | Status: DC

## 2023-08-10 MED ORDER — POLYVINYL ALCOHOL 1.4 % OP SOLN: 2.0000 [drp] | Freq: Three times a day (TID) | OPHTHALMIC | Status: DC | PRN

## 2023-08-10 MED ORDER — POLYSACCHARIDE IRON COMPLEX 150 MG PO CAPS
150.0000 mg | ORAL_CAPSULE | Freq: Every day | ORAL | Status: DC
  Administered 2023-08-12: 150 mg via ORAL
  Filled 2023-08-10 (×2): qty 1

## 2023-08-10 MED ORDER — GABAPENTIN 300 MG PO CAPS
ORAL_CAPSULE | ORAL | Status: AC
  Filled 2023-08-10: qty 1

## 2023-08-10 MED ORDER — ISOSORBIDE MONONITRATE ER 30 MG PO TB24
30.0000 mg | ORAL_TABLET | Freq: Every day | ORAL | Status: DC
  Administered 2023-08-12: 30 mg via ORAL
  Filled 2023-08-10 (×2): qty 1

## 2023-08-10 MED ORDER — CEFAZOLIN SODIUM-DEXTROSE 2-4 GM/100ML-% IV SOLN
2.0000 g | INTRAVENOUS | Status: AC
  Administered 2023-08-11: 2 g via INTRAVENOUS
  Filled 2023-08-10: qty 100

## 2023-08-10 MED ORDER — PANTOPRAZOLE SODIUM 40 MG PO TBEC
40.0000 mg | DELAYED_RELEASE_TABLET | Freq: Every day | ORAL | Status: DC
  Administered 2023-08-10 – 2023-08-12 (×2): 40 mg via ORAL
  Filled 2023-08-10 (×3): qty 1

## 2023-08-10 MED ORDER — SODIUM ZIRCONIUM CYCLOSILICATE 10 G PO PACK
10.0000 g | PACK | Freq: Two times a day (BID) | ORAL | Status: DC
  Filled 2023-08-10: qty 1

## 2023-08-10 MED ORDER — CALCIUM GLUCONATE-NACL 1-0.675 GM/50ML-% IV SOLN
1.0000 g | Freq: Once | INTRAVENOUS | Status: AC
  Administered 2023-08-10: 1000 mg via INTRAVENOUS
  Filled 2023-08-10: qty 50

## 2023-08-10 MED ORDER — ONDANSETRON HCL 4 MG/2ML IJ SOLN
4.0000 mg | Freq: Four times a day (QID) | INTRAMUSCULAR | Status: DC | PRN
  Administered 2023-08-11: 4 mg via INTRAVENOUS

## 2023-08-10 MED ORDER — PATIROMER SORBITEX CALCIUM 8.4 G PO PACK
16.8000 g | PACK | Freq: Every day | ORAL | Status: DC
  Administered 2023-08-10 – 2023-08-12 (×2): 16.8 g via ORAL
  Filled 2023-08-10 (×3): qty 2

## 2023-08-10 MED ORDER — LABETALOL HCL 100 MG PO TABS
200.0000 mg | ORAL_TABLET | Freq: Two times a day (BID) | ORAL | Status: DC
Start: 2023-08-10 — End: 2023-08-13
  Administered 2023-08-10 – 2023-08-12 (×4): 200 mg via ORAL
  Filled 2023-08-10: qty 1
  Filled 2023-08-10 (×4): qty 2
  Filled 2023-08-10 (×2): qty 1
  Filled 2023-08-10: qty 2

## 2023-08-10 NOTE — Progress Notes (Signed)
 08/10/23  Patient planned for surgery today for a robotic assisted right inguinal hernia repair, open umbilical hernia repair, and laparoscopic assisted peritoneal dialysis catheter placement.  Prior to surgery, had known anemia with Hgb of 7.7 and we had plans for 1 unit pRBC transfusion in preop.  His prior BMP on 07/27/23 showed a K of 4.9.  However, this morning, his K is 5.5 and on repeat was 5.6.  Given this, there is concern from anesthesia standpoint with proceeding today.  Discussed with Dr. Vicci from Anesthesiology.  This surgery is not emergent either.  Discussed with Dr. Dennise with Nephrology and he recommends admission today for management of his hyperkalemia, with plan to bring him back to OR tomorrow for our planned surgery.  Discussed with the patient and he understands the situation.  Have discussed with Dr. Debby who will admit the patient.  Will make him NPO after midnight again and pending K and labs, would plan to proceed with surgery tomorrow.  All of his questions have been answered.  Trevor Plant, MD

## 2023-08-10 NOTE — Progress Notes (Signed)
 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  Patient presents to the hospital for scheduled surgical procedure for PD catheter placement and was found to have an elevated potassium level.  Patient now admitted for Right inguinal hernia [K40.90] Umbilical hernia without obstruction and without gangrene [K42.9] End stage renal disease (HCC) [N18.6] Hyperkalemia [E87.5]  Patient is known our practice and is followed outpatient by Dr. Dennise.  Patient is currently in the process of being initiated onto peritoneal dialysis.  Patient has been difficult to contact outpatient and has not received any recent labs for outpatient placement.  Patient is seen resting in bed, son at bedside.  No complaints to offer today.  Currently on room air.  Labs on arrival concerning for potassium 5.6.  Surgical procedure postponed.  Patient ordered Lokelma  to manage elevated potassium levels.  Surgery to plan on [facility] insertion tomorrow.  We have been consulted to help manage hyperkalemia.   Objective:  Vital signs in last 24 hours:  Temp:  [97.3 F (36.3 C)-98.4 F (36.9 C)] 97.3 F (36.3 C) (07/31 1423) Pulse Rate:  [62-77] 77 (07/31 1423) Resp:  [16-22] 17 (07/31 1423) BP: (152-168)/(83-89) 152/83 (07/31 1423) SpO2:  [98 %-100 %] 98 % (07/31 1423)  Weight change:  There were no vitals filed for this visit.  Intake/Output: No intake/output data recorded.   Intake/Output this shift:  Total I/O In: 392 [Blood:392] Out: -   Physical Exam: General: NAD  Head: Normocephalic, atraumatic. Moist oral mucosal membranes  Eyes: Anicteric  Neck: Supple  Lungs:  Clear to auscultation, normal effort  Heart: Regular rate and rhythm  Abdomen:  Soft, nontender  Extremities: No peripheral edema.  Neurologic:  Awake, alert, conversant  Skin: Warm,dry, no rash  Access: None    Basic Metabolic Panel: Recent Labs  Lab 08/10/23 0652 08/10/23 0704 08/10/23 0708 08/10/23 1246 08/10/23 1538  NA 143 143  --   --   --   K 5.5* 5.5* 5.6* 4.9 5.3*  CL 112* 113*  --   --   --   GLUCOSE 102* 102*  --   --   --   BUN 93* 94*  --   --   --   CREATININE 9.80* 9.70*  --   --   --     Liver Function Tests: No results for input(s): AST, ALT, ALKPHOS, BILITOT, PROT, ALBUMIN in the last 168 hours. No results for input(s): LIPASE, AMYLASE in the last 168 hours. No results for input(s): AMMONIA in the last 168 hours.  CBC: Recent Labs  Lab 08/09/23 1548 08/10/23 0652 08/10/23 0704 08/10/23 1246  WBC 4.2  --   --   --   NEUTROABS 2.9  --   --   --   HGB 7.7* 7.5* 7.5* 9.0*  HCT 24.1* 22.0* 22.0* 28.3*  MCV 91.3  --   --   --   PLT 157  --   --   --     Cardiac Enzymes: No results for input(s): CKTOTAL, CKMB, CKMBINDEX, TROPONINI in the last 168 hours.  BNP: Invalid input(s): POCBNP  CBG: Recent Labs  Lab 08/10/23 1202  GLUCAP 170*    Microbiology: Results for orders placed or performed during the hospital encounter of 03/21/20  Resp Panel by RT-PCR (Flu A&B, Covid) Nasopharyngeal Swab  Status: None   Collection Time: 03/21/20 11:32 PM   Specimen: Nasopharyngeal Swab; Nasopharyngeal(NP) swabs in vial transport medium  Result Value Ref Range Status   SARS Coronavirus 2 by RT PCR NEGATIVE NEGATIVE Final    Comment: (NOTE) SARS-CoV-2 target nucleic acids are NOT DETECTED.  The SARS-CoV-2 RNA is generally detectable in upper respiratory specimens during the acute phase of infection. The lowest concentration of SARS-CoV-2 viral copies this assay can detect is 138 copies/mL. A negative result does not preclude SARS-Cov-2 infection and should not be used as the sole basis for treatment or other patient management decisions. A negative result may occur  with  improper specimen collection/handling, submission of specimen other than nasopharyngeal swab, presence of viral mutation(s) within the areas targeted by this assay, and inadequate number of viral copies(<138 copies/mL). A negative result must be combined with clinical observations, patient history, and epidemiological information. The expected result is Negative.  Fact Sheet for Patients:  BloggerCourse.com  Fact Sheet for Healthcare Providers:  SeriousBroker.it  This test is no t yet approved or cleared by the [place]  FDA and  has been authorized for detection and/or diagnosis of SARS-CoV-2 by FDA under an Emergency Use Authorization (EUA). This EUA will remain  in effect (meaning this test can be used) for the duration of the COVID-19 declaration under Section 564(b)(1) of the Act, 21 U.S.C.section 360bbb-3(b)(1), unless the authorization is terminated  or revoked sooner.       Influenza A by PCR NEGATIVE NEGATIVE Final   Influenza B by PCR NEGATIVE NEGATIVE Final    Comment: (NOTE) The Xpert Xpress SARS-CoV-2/FLU/RSV plus assay is intended as an aid in the diagnosis of influenza from Nasopharyngeal swab specimens and should not be used as a sole basis for treatment. Nasal washings and aspirates are unacceptable for Xpert Xpress SARS-CoV-2/FLU/RSV testing.  Fact Sheet for Patients: BloggerCourse.com  Fact Sheet for Healthcare Providers: SeriousBroker.it  This test is not yet approved or cleared by the [place]  FDA and has been authorized for detection and/or diagnosis of SARS-CoV-2 by FDA under an Emergency Use Authorization (EUA). This EUA will remain in effect (meaning this test can be used) for the duration of the COVID-19 declaration under Section 564(b)(1) of the Act, 21 U.S.C. section 360bbb-3(b)(1), unless the authorization is terminated  or revoked.  Performed at [facility], [address] [place], [place] [address]   MRSA PCR Screening     Status: None   Collection Time: 03/22/20  6:18 AM   Specimen: Nasopharyngeal  Result Value Ref Range Status   MRSA by PCR NEGATIVE NEGATIVE Final    Comment:        The GeneXpert MRSA Assay (FDA approved for NASAL specimens only), is one component of a comprehensive MRSA colonization surveillance program. It is not intended to diagnose MRSA infection nor to guide or monitor treatment for MRSA infections. Performed at [facility], [address] [place], [place] [address]   Urine Culture     Status: Abnormal   Collection Time: 03/23/20 11:44 PM   Specimen: Urine, Clean Catch  Result Value Ref Range Status   Specimen Description   Final    URINE, CLEAN CATCH Performed at [facility], [address] [place], [place] [address]    Special Requests   Final    NONE Performed at [facility], [address] [place], [place] [address]    Culture (A)  Final    10,000 COLONIES/mL GROUP B STREP(S.AGALACTIAE)ISOLATED  TESTING AGAINST S. AGALACTIAE NOT ROUTINELY PERFORMED DUE TO PREDICTABILITY OF AMP/PEN/VAN SUSCEPTIBILITY. Performed at [facility], [address] [place], [place] [address]    Report Status 03/25/2020 FINAL  Final    Coagulation Studies: No results for input(s): LABPROT, INR in the last 72 hours.  Urinalysis: No results for input(s): COLORURINE, LABSPEC, PHURINE, GLUCOSEU, HGBUR, BILIRUBINUR, KETONESUR, PROTEINUR, UROBILINOGEN, NITRITE, LEUKOCYTESUR in the last 72 hours.  Invalid input(s): APPERANCEUR    Imaging: DG Chest 1 View Result Date: 08/10/2023 EXAM: 1 VIEW XRAY OF THE CHEST 08/10/2023 12:54:10 PM COMPARISON: 03/25/2020 radiograph and CT 03/22/2020. CLINICAL HISTORY: End stage chronic kidney disease (HCC). FINDINGS: LUNGS AND PLEURA: There is a 1 cm ill-defined opacity  laterally in the right mid lung field. No pulmonary edema. No pleural effusion. No pneumothorax. HEART AND MEDIASTINUM: No acute abnormality of the cardiac and mediastinal silhouettes. BONES AND SOFT TISSUES: No acute osseous abnormality. Contrast material in the region of the gastric fundus. IMPRESSION: 1. 1 cm ill-defined opacity laterally in the right mid lung field. Electronically signed by: Dayne Hassell MD 08/10/2023 02:02 PM EDT RP Workstation: [id number]     Medications:    [START ON 08/11/2023]  ceFAZolin  (ANCEF ) IV      amLODipine   10 mg Oral Daily   cloNIDine   0.1 mg Oral BID   furosemide   40 mg Oral Daily   insulin  aspart  0-5 Units Subcutaneous QHS   insulin  aspart  0-6 Units Subcutaneous TID WC   [START ON 08/11/2023] iron  polysaccharides  150 mg Oral Daily   [START ON 08/11/2023] isosorbide  mononitrate  30 mg Oral Daily   labetalol   200 mg Oral BID   pantoprazole   40 mg Oral Daily   sodium zirconium cyclosilicate   10 g Oral BID   acetaminophen  **OR** acetaminophen , albuterol , artificial tears, ondansetron  **OR** ondansetron  (ZOFRAN ) IV  Assessment/ Plan:  Trevor Meyer is a 58 y.o.  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  Patient presents to the hospital for scheduled surgical procedure for PD catheter placement and was found to have an elevated potassium level.  Patient now admitted for Right inguinal hernia [K40.90] Umbilical hernia without obstruction and without gangrene [K42.9] End stage renal disease (HCC) [N18.6] Hyperkalemia [E87.5]   Hyperkalemia with chronic kidney disease stage V.  Potassium 5.6 preprocedure.  Patient scheduled for PD catheter insertion however postponed due to elevated potassium levels.  Patient ordered Lokelma  10 g twice today.  Will consider additional dose at midnight.  Dialysis navigator notified of patient and will initiate  urgent start at [facility].  Hepatitis B panel and chest x-ray ordered for outpatient clinic placement.  2. Anemia of chronic kidney disease Lab Results  Component Value Date   HGB 9.0 (L) 08/10/2023  Hemoglobin borderline but acceptable.  Will continue to monitor.  3. Secondary Hyperparathyroidism: with outpatient labs: PTH 89, phosphorus 5.9, calcium  10.7 on 06/29/23.   Lab Results  Component Value Date   PTH 28 06/03/2023   CALCIUM  8.1 (L) 07/27/2023   CAION 1.12 (L) 08/10/2023   PHOS 5.5 (H) 06/05/2023    Calcium  and phosphorus acceptable will monitor for now.  4.  Hypertension with chronic kidney disease.  Currently receiving amlodipine , clonidine , furosemide , isosorbide , and labetalol .   LOS: 0 Beauregard Jarrells 7/31/20254:40 PM

## 2023-08-10 NOTE — Anesthesia Preprocedure Evaluation (Signed)
 Anesthesia Evaluation  Patient identified by MRN, date of birth, ID band Patient awake    Reviewed: Allergy & Precautions, H&P , NPO status , Patient's Chart, lab work & pertinent test results  Airway Mallampati: II  TM Distance: >3 FB Neck ROM: full    Dental no notable dental hx.    Pulmonary neg pulmonary ROS   Pulmonary exam normal        Cardiovascular hypertension, Normal cardiovascular exam  H/o aortic mural thrombus- CTA chest 03/21/2020:  acute aortic syndrome with intramural hematoma extending from the LEFT subclavian artery origin to the diaphragmatic hiatus   Neuro/Psych negative neurological ROS  negative psych ROS   GI/Hepatic negative GI ROS, Neg liver ROS,,,  Endo/Other  negative endocrine ROS    Renal/GU Renal InsufficiencyRenal disease     Musculoskeletal   Abdominal   Peds  Hematology negative hematology ROS (+)   Anesthesia Other Findings Past Medical History: No date: Anemia of chronic renal failure No date: Aortic atherosclerosis (HCC) 03/21/2020: Aortic mural thrombus (HCC)     Comment:  a.) CTA chest 03/21/2020:  acute aortic syndrome with               intramural hematoma extending from the LEFT subclavian               artery origin to the diaphragmatic hiatus No date: CKD (chronic kidney disease), stage V (HCC) 2000: Clotting disorder (HCC) 03/2020: Hypertension No date: Iron  deficiency anemia No date: Polycystic kidney disease No date: Right inguinal hernia No date: Secondary hyperparathyroidism of renal origin (HCC) No date: Umbilical hernia  History reviewed. No pertinent surgical history.     Reproductive/Obstetrics negative OB ROS                              Anesthesia Physical Anesthesia Plan  ASA: 3  Anesthesia Plan: General ETT   Post-op Pain Management:    Induction:   PONV Risk Score and Plan: 2 and Ondansetron , Dexamethasone and  Midazolam  Airway Management Planned:   Additional Equipment:   Intra-op Plan:   Post-operative Plan:   Informed Consent:      Dental Advisory Given  Plan Discussed with: CRNA and Surgeon  Anesthesia Plan Comments:          Anesthesia Quick Evaluation

## 2023-08-10 NOTE — H&P (Signed)
 History and Physical    Trevor Meyer FMW:[id number] DOB: 12-Apr-1965 DOA: 08/10/2023  PCP: Manya Toribio SQUIBB, PA  Patient coming from: pre-0p  I have personally briefly reviewed patient's old medical records in [facility]  Chief Complaint: hyperkalemia  HPI: Trevor Meyer is a 58 y.o. male with medical history significant of CKDIV with associated AOCD as well as IDA, Aortic mural thrombus, Hypertension,  Umbilical and inguinal hernia who presented to pre-op for elective umbilical and inguinal repair. Patient labs however were notable for hyperkalemia with k  of 5.6 and due to this procedure was canceled. Patient case was discussed with on call nephrology who recommended admission to hospitalist service for treatment of hyperkalemia.  Patient currently has no complaints ,  Denies n/v/d/dysuria/ no chest pain or shortness of breath.    Vitals: Afeb, bp 161/89, hr 69, rr 16 sat 98%  Pre-op labs Na 143, K 5.6,  cr 9.70, glu 102, calcium  1.12, bicarb 19, hgb 7.5,   Review of Systems: As per HPI otherwise 10 point review of systems negative.   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 No pertinent surgical history.   reports that he has never smoked. He has never been exposed to tobacco smoke. He has never used smokeless tobacco. He reports that he does not drink alcohol  and does not use drugs.  Allergies  Allergen Reactions   Hydralazine  Other (See  Comments)    Flushing and tachycardia       Family History  Problem Relation Age of Onset   Cancer Mother    Stroke Father     Prior to Admission medications   Medication Sig Start Date End Date Taking? Authorizing Provider  amLODipine  (NORVASC ) 10 MG tablet Take 1 tablet (10 mg total) by mouth daily. 03/27/20 07/21/27 Yes Maree Hue, MD  Ascorbic Acid (VITAMIN C PO) Take 1 tablet by mouth in the morning. Patient not taking: Reported on 08/09/2023   Yes [provider]  B Complex-C (B-COMPLEX WITH VITAMIN C) tablet Take 1 tablet by mouth daily in the afternoon. Patient not taking: Reported on 08/09/2023   Yes [provider]  cloNIDine  (CATAPRES ) 0.1 MG tablet Take 1 tablet (0.1 mg total) by mouth 2 (two) times daily. 03/26/20  Yes Maree Hue, MD  furosemide  (LASIX ) 40 MG tablet Take 1 tablet (40 mg total) by mouth daily. 06/05/23 09/03/23 Yes Von Bellis, MD  iron  polysaccharides (NIFEREX) 150 MG capsule Take 1 capsule (150 mg total) by mouth daily. 06/09/23 09/07/23 Yes Von Bellis, MD  isosorbide  mononitrate (IMDUR ) 30 MG 24 hr tablet Take 1 tablet (30 mg total) by mouth daily. 03/27/20 07/21/27 Yes Maree Hue, MD  labetalol  (NORMODYNE ) 200 MG tablet Take 1 tablet (200 mg total) by mouth 2 (two) times daily. 03/26/20 07/21/26 Yes Maree Hue, MD  MAGNESIUM  PO Take 1 capsule by mouth in the morning. Patient not taking: Reported  on 08/09/2023   Yes [provider]  Multiple Vitamins-Minerals (ZINC PO) Take 1 tablet by mouth in the morning. Patient not taking: Reported on 08/09/2023   Yes [provider]  Omega-3 Fatty Acids (FISH OIL) 1000 MG CAPS Take 1 capsule by mouth at bedtime. Patient not taking: Reported on 08/09/2023   Yes [provider]  pantoprazole  (PROTONIX ) 40 MG tablet Take 1 tablet (40 mg total) by mouth daily. 06/06/23 09/04/23 Yes Von Bellis, MD  Propylene Glycol (LUBRICANT EYE DROP) 0.6 % SOLN Place 1-2 drops into both eyes 3  (three) times daily as needed (dry/irritated eyes.).   Yes [provider]  sodium bicarbonate  650 MG tablet Take 1 tablet (650 mg total) by mouth 3 (three) times daily. Patient not taking: Reported on 08/09/2023 06/05/23 09/03/23 Yes Von Bellis, MD    Physical Exam: Vitals:   08/10/23 0639 08/10/23 0759 08/10/23 0826  BP: (!) 161/89 (!) 161/84 (!) 165/84  Pulse: 69 64 62  Resp: 16 16 16   Temp: (!) 97.5 F (36.4 C) 98 F (36.7 C) 97.7 F (36.5 C)  TempSrc: Temporal Oral Oral  SpO2: 98% 98% 98%    Constitutional: NAD, calm, comfortable Vitals:   08/10/23 0639 08/10/23 0759 08/10/23 0826  BP: (!) 161/89 (!) 161/84 (!) 165/84  Pulse: 69 64 62  Resp: 16 16 16   Temp: (!) 97.5 F (36.4 C) 98 F (36.7 C) 97.7 F (36.5 C)  TempSrc: Temporal Oral Oral  SpO2: 98% 98% 98%   Eyes: PERRL, lids and conjunctivae normal ENMT: Mucous membranes are moist. Posterior pharynx clear of any exudate or lesions.Normal dentition.  Neck: normal, supple, no masses, no thyromegaly Respiratory: clear to auscultation bilaterally, no wheezing, no crackles. Normal respiratory effort. No accessory muscle use.  Cardiovascular: Regular rate and rhythm, no murmurs / rubs / gallops. No extremity edema. 2+ pedal pulses Abdomen: no tenderness, no masses palpated. No hepatosplenomegaly. Bowel sounds positive.  Musculoskeletal: no clubbing / cyanosis. No joint deformity upper and lower extremities. Good ROM, no contractures. Normal muscle tone.  Skin: no rashes, lesions, ulcers. No induration Neurologic: CN  grossly intact. Sensation intact,Strength 5/5 in all 4.  Psychiatric: Normal judgment and insight. Alert and oriented x 3. Normal mood.    Labs on Admission: I have personally reviewed following labs and imaging studies  CBC: Recent Labs  Lab 08/09/23 1548 08/10/23 0652 08/10/23 0704  WBC 4.2  --   --   NEUTROABS 2.9  --   --   HGB 7.7* 7.5* 7.5*  HCT 24.1* 22.0* 22.0*  MCV 91.3  --   --    PLT 157  --   --    Basic Metabolic Panel: Recent Labs  Lab 08/10/23 0652 08/10/23 0704 08/10/23 0708  NA 143 143  --   K 5.5* 5.5* 5.6*  CL 112* 113*  --   GLUCOSE 102* 102*  --   BUN 93* 94*  --   CREATININE 9.80* 9.70*  --    GFR: Estimated Creatinine Clearance: 7.4 mL/min (A) (by C-G formula based on SCr of 9.7 mg/dL (H)). Liver Function Tests: No results for input(s): AST, ALT, ALKPHOS, BILITOT, PROT, ALBUMIN in the last 168 hours. No results for input(s): LIPASE, AMYLASE in the last 168 hours. No results for input(s): AMMONIA in the last 168 hours. Coagulation Profile: No results for input(s): INR, PROTIME in the last 168 hours. Cardiac Enzymes: No results for input(s): CKTOTAL, CKMB, CKMBINDEX, TROPONINI in the last 168 hours.  BNP (last 3 results) No results for input(s): PROBNP in the last 8760 hours. HbA1C: No results for input(s): HGBA1C in the last 72 hours. CBG: No results for input(s): GLUCAP in the last 168 hours. Lipid Profile: No results for input(s): CHOL, HDL, LDLCALC, TRIG, CHOLHDL, LDLDIRECT in the last 72 hours. Thyroid Function Tests: No results for input(s): TSH, T4TOTAL, FREET4, T3FREE, THYROIDAB in the last 72 hours. Anemia Panel: Recent Labs    08/09/23 1548  FERRITIN 12*  TIBC 375  IRON  94  RETICCTPCT 1.2   Urine analysis:    Component Value Date/Time   COLORURINE COLORLESS (A) 06/03/2023 1055   APPEARANCEUR CLEAR (A) 06/03/2023 1055   LABSPEC 1.009 06/03/2023 1055   PHURINE 6.0 06/03/2023 1055   GLUCOSEU 150 (A) 06/03/2023 1055   HGBUR NEGATIVE 06/03/2023 1055   BILIRUBINUR NEGATIVE 06/03/2023 1055   KETONESUR NEGATIVE 06/03/2023 1055   PROTEINUR NEGATIVE 06/03/2023 1055   NITRITE NEGATIVE 06/03/2023 1055   LEUKOCYTESUR NEGATIVE 06/03/2023 1055    Radiological Exams on Admission: No results found.  EKG: Independently reviewed.    Assessment/Plan  Hyperkalemia -admit to cardiac tele  - start on hyperkalemia protocol with lokelma   -calcium  gluconate x 1  -monitor K q2-4 hours adjust treatment base on labs  -vbg for completeness   CKDIV not on HD Acquired Polycytic Kidney disease -cr  at baseline 8-9 -no signs of fluid overload  - continue to monitor labs  -conitnue on bicarb, continue on phosphate binders - f/u on renal recs  -resume outpatient renal medications - place on low k diet  -plan for PD cath placement and teaching outpatient prior to initiation of PD -strict I/o   Chronic Metabolic Acidosis -continue bicarb  Essential Hypertension -not at goal <140/90 -continue on home regimen , clonidine , amlodipine , labetalol      AOCD as well as IDA -continue Iron  supplement  -planned venofer by hematology,consider inpatient once stable - plan of ESA when HD initiated  -s/p prbc 1 unit in pre-op    Aortic mural thrombus -followed by vascular  -MRA 08/02/2023 - notes  Previously observed intramural hematoma involving the thoracic aorta is significantly improved.   Umbilical and inguinal hernia  PD cath placement -elective surgery cancelled today due to hyperkalemia -plan for surgery in am s/p optimization  - will place npo at midnight  -await final renal recs   DVT prophylaxis:  SCD Code Status: full/ as discussed per patient wishes in event of cardiac arrest  Family Communication: none at bedside  Disposition Plan: patient  expected to be admitted less than 2 midnights  Consults called: Dr Dennise Nephrology , Dr Desiderio  Surgery Admission status:  cardiac tele   Camila DELENA Ned MD [facility]   If 7PM-7AM, please contact night-coverage [email or web address] Password [email or web address]  08/10/2023, 9:12 AM

## 2023-08-11 ENCOUNTER — Encounter
Admission: AD | Disposition: A | Discharge status: Home / Self Care | Payer: Self-pay | Source: Home / Self Care | Attending: Internal Medicine

## 2023-08-11 ENCOUNTER — Other Ambulatory Visit: Payer: Self-pay

## 2023-08-11 ENCOUNTER — Inpatient Hospital Stay: Admitting: Anesthesiology

## 2023-08-11 ENCOUNTER — Ambulatory Visit: Admission: RE | Admit: 2023-08-11 | Source: Home / Self Care | Admitting: Surgery

## 2023-08-11 DIAGNOSIS — I10 Essential (primary) hypertension: Secondary | ICD-10-CM

## 2023-08-11 DIAGNOSIS — K429 Umbilical hernia without obstruction or gangrene: Secondary | ICD-10-CM

## 2023-08-11 DIAGNOSIS — I7410 Embolism and thrombosis of unspecified parts of aorta: Secondary | ICD-10-CM | POA: Diagnosis not present

## 2023-08-11 DIAGNOSIS — K4090 Unilateral inguinal hernia, without obstruction or gangrene, not specified as recurrent: Secondary | ICD-10-CM

## 2023-08-11 DIAGNOSIS — Z992 Dependence on renal dialysis: Secondary | ICD-10-CM | POA: Diagnosis not present

## 2023-08-11 DIAGNOSIS — N186 End stage renal disease: Secondary | ICD-10-CM | POA: Diagnosis not present

## 2023-08-11 DIAGNOSIS — E875 Hyperkalemia: Secondary | ICD-10-CM | POA: Diagnosis not present

## 2023-08-11 DIAGNOSIS — D509 Iron deficiency anemia, unspecified: Secondary | ICD-10-CM

## 2023-08-11 HISTORY — PX: CAPD INSERTION: SHX5233

## 2023-08-11 HISTORY — PX: UMBILICAL HERNIA REPAIR: SHX196

## 2023-08-11 HISTORY — PX: HERNIORRHAPHY, INGUINAL, ROBOT-ASSISTED, LAPAROSCOPIC: SHX7585

## 2023-08-11 LAB — TYPE AND SCREEN
ABO/RH(D): B POS
Antibody Screen: NEGATIVE
Unit division: 0

## 2023-08-11 LAB — MULTIPLE MYELOMA PANEL, SERUM
Albumin SerPl Elph-Mcnc: 4.1 g/dL (ref 2.9–4.4)
Albumin/Glob SerPl: 1.4 (ref 0.7–1.7)
Alpha 1: 0.3 g/dL (ref 0.0–0.4)
Alpha2 Glob SerPl Elph-Mcnc: 0.3 g/dL — ABNORMAL LOW (ref 0.4–1.0)
B-Globulin SerPl Elph-Mcnc: 1 g/dL (ref 0.7–1.3)
Gamma Glob SerPl Elph-Mcnc: 1.3 g/dL (ref 0.4–1.8)
Globulin, Total: 3 g/dL (ref 2.2–3.9)
IgA: 223 mg/dL (ref 90–386)
IgG (Immunoglobin G), Serum: 1387 mg/dL (ref 603–1613)
IgM (Immunoglobulin M), Srm: 96 mg/dL (ref 20–172)
Total Protein ELP: 7.1 g/dL (ref 6.0–8.5)

## 2023-08-11 LAB — GLUCOSE, CAPILLARY
Glucose-Capillary: 83 mg/dL (ref 70–99)
Glucose-Capillary: 114 mg/dL — ABNORMAL HIGH (ref 70–99)

## 2023-08-11 LAB — COMPREHENSIVE METABOLIC PANEL WITH GFR
ALT: 9 U/L (ref 0–44)
AST: 10 U/L — ABNORMAL LOW (ref 15–41)
Albumin: 3.5 g/dL (ref 3.5–5.0)
Alkaline Phosphatase: 36 U/L — ABNORMAL LOW (ref 38–126)
Anion gap: 11 (ref 5–15)
BUN: 84 mg/dL — ABNORMAL HIGH (ref 6–20)
CO2: 19 mmol/L — ABNORMAL LOW (ref 22–32)
Calcium: 8.5 mg/dL — ABNORMAL LOW (ref 8.9–10.3)
Chloride: 112 mmol/L — ABNORMAL HIGH (ref 98–111)
Creatinine, Ser: 8.82 mg/dL — ABNORMAL HIGH (ref 0.61–1.24)
GFR, Estimated: 6 mL/min — ABNORMAL LOW (ref >60)
Glucose, Bld: 98 mg/dL (ref 70–99)
Potassium: 4.7 mmol/L (ref 3.5–5.1)
Sodium: 142 mmol/L (ref 135–145)
Total Bilirubin: 0.6 mg/dL (ref 0.0–1.2)
Total Protein: 7.1 g/dL (ref 6.5–8.1)

## 2023-08-11 LAB — CBC
HCT: 27.1 % — ABNORMAL LOW (ref 39.0–52.0)
Hemoglobin: 8.9 g/dL — ABNORMAL LOW (ref 13.0–17.0)
MCH: 28.7 pg (ref 26.0–34.0)
MCHC: 32.8 g/dL (ref 30.0–36.0)
MCV: 87.4 fL (ref 80.0–100.0)
Platelets: 140 K/uL — ABNORMAL LOW (ref 150–400)
RBC: 3.1 MIL/uL — ABNORMAL LOW (ref 4.22–5.81)
RDW: 14.3 % (ref 11.5–15.5)
WBC: 4.9 K/uL (ref 4.0–10.5)
nRBC: 0 % (ref 0.0–0.2)

## 2023-08-11 LAB — BPAM RBC
Blood Product Expiration Date: 202508232359
ISSUE DATE / TIME: 202507310745
Unit Type and Rh: 7300

## 2023-08-11 LAB — CA 19-9 (SERIAL): CA 19-9: 5 U/mL (ref 0–35)

## 2023-08-11 LAB — HEPATITIS B CORE ANTIBODY, TOTAL: HEP B CORE AB: POSITIVE — AB

## 2023-08-11 LAB — HEPATITIS B SURFACE ANTIBODY, QUANTITATIVE: Hep B S AB Quant (Post): 396 m[IU]/mL

## 2023-08-11 SURGERY — HERNIORRHAPHY, INGUINAL, ROBOT-ASSISTED, LAPAROSCOPIC
Anesthesia: General | Laterality: Right

## 2023-08-11 MED ORDER — MIDAZOLAM HCL 2 MG/2ML IJ SOLN
INTRAMUSCULAR | Status: AC
  Filled 2023-08-11: qty 2

## 2023-08-11 MED ORDER — OXYCODONE HCL 5 MG PO TABS: 5.0000 mg | ORAL_TABLET | Freq: Once | ORAL | Status: DC | PRN

## 2023-08-11 MED ORDER — OXYCODONE HCL 5 MG PO TABS: 5.0000 mg | ORAL_TABLET | ORAL | 0 refills | Status: DC | PRN

## 2023-08-11 MED ORDER — SUGAMMADEX SODIUM 200 MG/2ML IV SOLN
INTRAVENOUS | Status: DC | PRN
  Administered 2023-08-11: 200 mg via INTRAVENOUS

## 2023-08-11 MED ORDER — ONDANSETRON HCL 4 MG/2ML IJ SOLN
INTRAMUSCULAR | Status: AC
  Filled 2023-08-11: qty 2

## 2023-08-11 MED ORDER — ACETAMINOPHEN 10 MG/ML IV SOLN: 1000.0000 mg | Freq: Once | INTRAVENOUS | Status: DC | PRN

## 2023-08-11 MED ORDER — OXYCODONE HCL 5 MG/5ML PO SOLN: 5.0000 mg | Freq: Once | ORAL | Status: DC | PRN

## 2023-08-11 MED ORDER — BUPIVACAINE LIPOSOME 1.3 % IJ SUSP
INTRAMUSCULAR | Status: AC
  Filled 2023-08-11: qty 10

## 2023-08-11 MED ORDER — HEPARIN 5000 UNITS IN NS 1000 ML (FLUSH)
INTRAMUSCULAR | Status: DC | PRN
Start: 2023-08-11 — End: 2023-08-11
  Administered 2023-08-11: 1000 mL via INTRAMUSCULAR

## 2023-08-11 MED ORDER — BUPIVACAINE LIPOSOME 1.3 % IJ SUSP
INTRAMUSCULAR | Status: AC
Start: 2023-08-11 — End: 2023-08-11
  Filled 2023-08-11: qty 10

## 2023-08-11 MED ORDER — DEXMEDETOMIDINE HCL IN NACL 80 MCG/20ML IV SOLN
INTRAVENOUS | Status: DC | PRN
Start: 2023-08-11 — End: 2023-08-11
  Administered 2023-08-11: 8 ug via INTRAVENOUS
  Administered 2023-08-11: 4 ug via INTRAVENOUS
  Administered 2023-08-11: 12 ug via INTRAVENOUS

## 2023-08-11 MED ORDER — ROCURONIUM BROMIDE 10 MG/ML (PF) SYRINGE
PREFILLED_SYRINGE | INTRAVENOUS | Status: AC
  Filled 2023-08-11: qty 10

## 2023-08-11 MED ORDER — 0.9 % SODIUM CHLORIDE (POUR BTL) OPTIME
TOPICAL | Status: DC | PRN
  Administered 2023-08-11: 500 mL

## 2023-08-11 MED ORDER — PROPOFOL 10 MG/ML IV BOLUS
INTRAVENOUS | Status: AC
Start: 2023-08-11 — End: 2023-08-11
  Filled 2023-08-11: qty 20

## 2023-08-11 MED ORDER — DROPERIDOL 2.5 MG/ML IJ SOLN: 0.6250 mg | Freq: Once | INTRAMUSCULAR | Status: DC | PRN

## 2023-08-11 MED ORDER — HYDRALAZINE HCL 20 MG/ML IJ SOLN
INTRAMUSCULAR | Status: AC
Start: 2023-08-11 — End: 2023-08-11
  Filled 2023-08-11: qty 1

## 2023-08-11 MED ORDER — ROCURONIUM BROMIDE 100 MG/10ML IV SOLN
INTRAVENOUS | Status: DC | PRN
  Administered 2023-08-11 (×3): 10 mg via INTRAVENOUS
  Administered 2023-08-11: 50 mg via INTRAVENOUS

## 2023-08-11 MED ORDER — OXYCODONE HCL 5 MG PO TABS
5.0000 mg | ORAL_TABLET | ORAL | Status: DC | PRN
  Administered 2023-08-12 (×3): 10 mg via ORAL
  Filled 2023-08-11 (×3): qty 2

## 2023-08-11 MED ORDER — ACETAMINOPHEN 500 MG PO TABS: 1000.0000 mg | ORAL_TABLET | Freq: Four times a day (QID) | ORAL | Status: DC | PRN

## 2023-08-11 MED ORDER — IRON SUCROSE 200 MG IVPB - SIMPLE MED
200.0000 mg | Freq: Once | Status: AC
  Administered 2023-08-12: 200 mg via INTRAVENOUS
  Filled 2023-08-11: qty 200
  Filled 2023-08-11: qty 110

## 2023-08-11 MED ORDER — DEXAMETHASONE SODIUM PHOSPHATE 10 MG/ML IJ SOLN
INTRAMUSCULAR | Status: DC | PRN
  Administered 2023-08-11: 6 mg via INTRAVENOUS

## 2023-08-11 MED ORDER — LABETALOL HCL 5 MG/ML IV SOLN
INTRAVENOUS | Status: AC
  Filled 2023-08-11: qty 4

## 2023-08-11 MED ORDER — PROPOFOL 10 MG/ML IV BOLUS
INTRAVENOUS | Status: DC | PRN
  Administered 2023-08-11: 150 mg via INTRAVENOUS

## 2023-08-11 MED ORDER — DEXAMETHASONE SODIUM PHOSPHATE 10 MG/ML IJ SOLN
INTRAMUSCULAR | Status: AC
  Filled 2023-08-11: qty 1

## 2023-08-11 MED ORDER — FENTANYL CITRATE (PF) 100 MCG/2ML IJ SOLN
INTRAMUSCULAR | Status: DC | PRN
  Administered 2023-08-11: 25 ug via INTRAVENOUS
  Administered 2023-08-11: 50 ug via INTRAVENOUS
  Administered 2023-08-11: 25 ug via INTRAVENOUS
  Administered 2023-08-11: 50 ug via INTRAVENOUS

## 2023-08-11 MED ORDER — FENTANYL CITRATE (PF) 100 MCG/2ML IJ SOLN: 25.0000 ug | INTRAMUSCULAR | Status: DC | PRN

## 2023-08-11 MED ORDER — BUPIVACAINE LIPOSOME 1.3 % IJ SUSP
INTRAMUSCULAR | Status: DC | PRN
  Administered 2023-08-11: 10 mL

## 2023-08-11 MED ORDER — BUPIVACAINE-EPINEPHRINE 0.5% -1:200000 IJ SOLN
INTRAMUSCULAR | Status: DC | PRN
  Administered 2023-08-11: 30 mL

## 2023-08-11 MED ORDER — FENTANYL CITRATE (PF) 100 MCG/2ML IJ SOLN
INTRAMUSCULAR | Status: AC
  Filled 2023-08-11: qty 2

## 2023-08-11 MED ORDER — LABETALOL HCL 5 MG/ML IV SOLN
10.0000 mg | Freq: Once | INTRAVENOUS | Status: AC
  Administered 2023-08-11: 10 mg via INTRAVENOUS

## 2023-08-11 MED ORDER — HYDROMORPHONE HCL 1 MG/ML IJ SOLN
0.5000 mg | INTRAMUSCULAR | Status: DC | PRN
  Administered 2023-08-11: 0.5 mg via INTRAVENOUS
  Filled 2023-08-11: qty 0.5

## 2023-08-11 MED ORDER — MIDAZOLAM HCL 2 MG/2ML IJ SOLN
INTRAMUSCULAR | Status: DC | PRN
Start: 2023-08-11 — End: 2023-08-11
  Administered 2023-08-11: 2 mg via INTRAVENOUS

## 2023-08-11 MED ORDER — LACTATED RINGERS IV SOLN: INTRAVENOUS | Status: DC | PRN

## 2023-08-11 MED ORDER — LABETALOL HCL 5 MG/ML IV SOLN
INTRAVENOUS | Status: AC
Start: 2023-08-11 — End: 2023-08-11
  Filled 2023-08-11: qty 4

## 2023-08-11 MED ORDER — HEPARIN SODIUM (PORCINE) 5000 UNIT/ML IJ SOLN
INTRAMUSCULAR | Status: AC
  Filled 2023-08-11: qty 1

## 2023-08-11 MED ORDER — BUPIVACAINE-EPINEPHRINE (PF) 0.5% -1:200000 IJ SOLN
INTRAMUSCULAR | Status: AC
  Filled 2023-08-11: qty 30

## 2023-08-11 MED ORDER — LABETALOL HCL 5 MG/ML IV SOLN
INTRAVENOUS | Status: DC | PRN
Start: 2023-08-11 — End: 2023-08-11
  Administered 2023-08-11 (×2): 5 mg via INTRAVENOUS

## 2023-08-11 MED ORDER — LIDOCAINE HCL (PF) 2 % IJ SOLN
INTRAMUSCULAR | Status: AC
  Filled 2023-08-11: qty 5

## 2023-08-11 MED ORDER — LIDOCAINE HCL (CARDIAC) PF 100 MG/5ML IV SOSY
PREFILLED_SYRINGE | INTRAVENOUS | Status: DC | PRN
  Administered 2023-08-11: 60 mg via INTRAVENOUS

## 2023-08-11 MED ORDER — HYDRALAZINE HCL 20 MG/ML IJ SOLN
INTRAMUSCULAR | Status: DC | PRN
  Administered 2023-08-11: 5 mg via INTRAVENOUS

## 2023-08-11 SURGICAL SUPPLY — 71 items
ADAPTER CATH DIALYSIS 4X8 IT L (MISCELLANEOUS) ×2 IMPLANT
BIOPATCH WHT 1IN DISK W/4.0 H (GAUZE/BANDAGES/DRESSINGS) ×2 IMPLANT
BLADE SURG 11 STRL SS SAFETY (MISCELLANEOUS) ×2 IMPLANT
BLADE SURG 15 STRL LF DISP TIS (BLADE) ×2 IMPLANT
CATH EXTENDED DIALYSIS (CATHETERS) ×2 IMPLANT
CHLORAPREP W/TINT 26 (MISCELLANEOUS) IMPLANT
COVER TIP SHEARS 8 DVNC (MISCELLANEOUS) ×2 IMPLANT
COVER WAND RF STERILE (DRAPES) ×2 IMPLANT
DEFOGGER SCOPE WARM SEASHARP (MISCELLANEOUS) ×2 IMPLANT
DERMABOND ADVANCED .7 DNX12 (GAUZE/BANDAGES/DRESSINGS) ×2 IMPLANT
DRAPE ARM DVNC X/XI (DISPOSABLE) ×6 IMPLANT
DRAPE COLUMN DVNC XI (DISPOSABLE) ×2 IMPLANT
DRAPE LAPAROTOMY 77X122 PED (DRAPES) ×2 IMPLANT
ELECTRODE CAUTERY BLDE TIP 2.5 (TIP) ×2 IMPLANT
ELECTRODE REM PT RTRN 9FT ADLT (ELECTROSURGICAL) ×2 IMPLANT
FORCEPS BPLR R/ABLATION 8 DVNC (INSTRUMENTS) ×2 IMPLANT
GAUZE 4X4 16PLY ~~LOC~~+RFID DBL (SPONGE) ×2 IMPLANT
GLOVE SURG SYN 7.0 PF PI (GLOVE) ×4 IMPLANT
GLOVE SURG SYN 7.5 PF PI (GLOVE) ×4 IMPLANT
GOWN STRL REUS W/ TWL LRG LVL3 (GOWN DISPOSABLE) ×8 IMPLANT
GRADUATE 1200CC STRL 31836 (MISCELLANEOUS) IMPLANT
IRRIGATION STRYKERFLOW (MISCELLANEOUS) IMPLANT
IV CATH ANGIO 12GX3 LT BLUE (NEEDLE) IMPLANT
IV NS 1000ML BAXH (IV SOLUTION) ×2 IMPLANT
KIT PINK PAD W/HEAD ARM REST (MISCELLANEOUS) ×2 IMPLANT
KIT TURNOVER KIT A (KITS) ×2 IMPLANT
LABEL OR SOLS (LABEL) ×2 IMPLANT
MANIFOLD NEPTUNE II (INSTRUMENTS) ×2 IMPLANT
MESH 3DMAX MID 4X6 RT LRG (Mesh General) IMPLANT
MINICAP W/POVIDONE IODINE SOL (MISCELLANEOUS) ×2 IMPLANT
NDL DRIVE SUT CUT DVNC (INSTRUMENTS) ×2 IMPLANT
NDL HYPO 22X1.5 SAFETY MO (MISCELLANEOUS) ×2 IMPLANT
NDL INSUFFLATION 14GA 120MM (NEEDLE) ×2 IMPLANT
NEEDLE DRIVE SUT CUT DVNC (INSTRUMENTS) ×2 IMPLANT
NEEDLE HYPO 22X1.5 SAFETY MO (MISCELLANEOUS) ×2 IMPLANT
NEEDLE INSUFFLATION 14GA 120MM (NEEDLE) ×2 IMPLANT
NS IRRIG 500ML POUR BTL (IV SOLUTION) ×2 IMPLANT
OBTURATOR OPTICALSTD 8 DVNC (TROCAR) ×2 IMPLANT
PACK BASIN MINOR ARMC (MISCELLANEOUS) ×2 IMPLANT
PACK LAP CHOLECYSTECTOMY (MISCELLANEOUS) ×2 IMPLANT
PENCIL SMOKE EVACUATOR COATED (MISCELLANEOUS) ×2 IMPLANT
SCISSORS MNPLR CVD DVNC XI (INSTRUMENTS) ×2 IMPLANT
SEAL UNIV 5-12 XI (MISCELLANEOUS) ×6 IMPLANT
SET CYSTO W/LG BORE CLAMP LF (SET/KITS/TRAYS/PACK) ×2 IMPLANT
SET TRANSFER 6 W/TWIST CLAMP 5 (SET/KITS/TRAYS/PACK) ×2 IMPLANT
SET TUBE SMOKE EVAC HIGH FLOW (TUBING) ×2 IMPLANT
SLEEVE ADV FIXATION 5X100MM (TROCAR) ×4 IMPLANT
SOLUTION ELECTROSURG ANTI STCK (MISCELLANEOUS) ×2 IMPLANT
SPONGE DRAIN TRACH 4X4 STRL 2S (GAUZE/BANDAGES/DRESSINGS) ×2 IMPLANT
SPONGE T-LAP 18X18 ~~LOC~~+RFID (SPONGE) ×2 IMPLANT
STYLET FALLER (MISCELLANEOUS) IMPLANT
STYLET FALLER MEDIONICS (MISCELLANEOUS) IMPLANT
SUT ETHIBOND 0 MO6 C/R (SUTURE) ×2 IMPLANT
SUT ETHILON 2 0 FS 18 (SUTURE) ×2 IMPLANT
SUT MNCRL AB 4-0 PS2 18 (SUTURE) ×2 IMPLANT
SUT PROLENE 2 0 FS (SUTURE) ×2 IMPLANT
SUT STRATA 2-0 23CM CT-2 (SUTURE) ×2 IMPLANT
SUT VIC AB 2-0 SH 27XBRD (SUTURE) ×2 IMPLANT
SUT VIC AB 3-0 SH 27X BRD (SUTURE) ×2 IMPLANT
SUT VICRYL 0 UR6 27IN ABS (SUTURE) ×4 IMPLANT
SUTURE MNCRL 4-0 27XMF (SUTURE) ×2 IMPLANT
SYR 20ML LL LF (SYRINGE) ×2 IMPLANT
SYSTEM BAG RETRIEVAL 10MM (BASKET) IMPLANT
SYSTEM KII FIOS ACES ABD 5X100 (TROCAR) ×2 IMPLANT
TAPE CLOTH SURG 4X10 WHT LF (GAUZE/BANDAGES/DRESSINGS) ×2 IMPLANT
TAPE TRANSPORE STRL 2 31045 (GAUZE/BANDAGES/DRESSINGS) ×2 IMPLANT
TRAP FLUID SMOKE EVACUATOR (MISCELLANEOUS) ×2 IMPLANT
TRAY FOLEY SLVR 16FR LF STAT (SET/KITS/TRAYS/PACK) ×2 IMPLANT
TROCAR KII BLADELESS 5X150 (TROCAR) ×2 IMPLANT
TROCAR Z THREAD OPTICAL 5X150 (TROCAR) IMPLANT
WATER STERILE IRR 500ML POUR (IV SOLUTION) ×2 IMPLANT

## 2023-08-11 NOTE — Transfer of Care (Signed)
 Immediate Anesthesia Transfer of Care Note  Patient: Trevor Meyer  Procedure(s) Performed: HERNIORRHAPHY, INGUINAL, ROBOT-ASSISTED, LAPAROSCOPIC (Right) REPAIR, HERNIA, UMBILICAL, ADULT LAPAROSCOPIC INSERTION CONTINUOUS AMBULATORY PERITONEAL DIALYSIS  (CAPD) CATHETER  Patient Location: PACU  Anesthesia Type:General  Level of Consciousness: drowsy and patient cooperative  Airway & Oxygen Therapy: Patient Spontanous Breathing and Patient connected to face mask oxygen  Post-op Assessment: Report given to RN and Post -op Vital signs reviewed and stable  Post vital signs: Reviewed and stable  Last Vitals:  Vitals Value Taken Time  BP 162/110 08/11/23 16:11  Temp    Pulse 73 08/11/23 16:11  Resp 12 08/11/23 16:11  SpO2 100 % 08/11/23 16:11  Vitals shown include unfiled device data.  Last Pain:  Vitals:   08/11/23 1140  TempSrc: Temporal  PainSc: 0-No pain         Complications: No notable events documented.

## 2023-08-11 NOTE — Assessment & Plan Note (Signed)
-   Continue home antihypertensives which include clonidine , amlodipine  and labetalol

## 2023-08-11 NOTE — Assessment & Plan Note (Signed)
 Improved with medical management, likely secondary to advanced renal disease. -Continue to monitor

## 2023-08-11 NOTE — Assessment & Plan Note (Signed)
 History of polycystic kidney disease. Now ESRD and going to be started on PD. PD was planned as outpatient but patient was missing some appointments to initiate.  PD catheter to be placed today and nephrology would like to start emergently over the weekend with some urgent training as he presented with hyperkalemia. - Nephrology is on board and managing

## 2023-08-11 NOTE — Plan of Care (Signed)
  Problem: Education: Goal: Knowledge of General Education information will improve Description: Including pain rating scale, medication(s)/side effects and non-pharmacologic comfort measures Outcome: Progressing   Problem: Clinical Measurements: Goal: Diagnostic test results will improve Outcome: Progressing   

## 2023-08-11 NOTE — H&P (View-Only) (Signed)
 08/11/2023  Subjective: No acute events.  Patient denies any abdominal pain.  K improved this morning after some up/down yesterday.  Down to 4.7 today.  Hgb responded well to transfusion yesterday.  Hgb 8.9 today.  Vital signs: Temp:  [97.3 F (36.3 C)-98.4 F (36.9 C)] 98.3 F (36.8 C) (08/01 0402) Pulse Rate:  [62-77] 72 (08/01 0402) Resp:  [16-22] 18 (08/01 0402) BP: (152-168)/(83-89) 161/85 (08/01 0402) SpO2:  [98 %-100 %] 98 % (08/01 0402)   Intake/Output: 07/31 0701 - 08/01 0700 In: 572 [P.O.:180; Blood:392] Out: -  Last BM Date : 08/09/23  Physical Exam: Constitutional:  No acute distress Abdomen:  soft, non-distended, non-tender.  Stable hernias right groin and umbilicus.  Labs:  Recent Labs    08/09/23 1548 08/10/23 0652 08/10/23 1246 08/11/23 0257  WBC 4.2  --   --  4.9  HGB 7.7*   < > 9.0* 8.9*  HCT 24.1*   < > 28.3* 27.1*  PLT 157  --   --  140*   < > = values in this interval not displayed.   Recent Labs    08/10/23 0704 08/10/23 0708 08/10/23 1935 08/11/23 0257  NA 143  --   --  142  K 5.5*   < > 5.7* 4.7  CL 113*  --   --  112*  CO2  --   --   --  19*  GLUCOSE 102*  --   --  98  BUN 94*  --   --  84*  CREATININE 9.70*  --   --  8.82*  CALCIUM   --   --   --  8.5*   < > = values in this interval not displayed.   No results for input(s): LABPROT, INR in the last 72 hours.  Imaging: DG Chest 1 View Result Date: 08/10/2023 EXAM: 1 VIEW XRAY OF THE CHEST 08/10/2023 12:54:10 PM COMPARISON: 03/25/2020 radiograph and CT 03/22/2020. CLINICAL HISTORY: End stage chronic kidney disease (HCC). FINDINGS: LUNGS AND PLEURA: There is a 1 cm ill-defined opacity laterally in the right mid lung field. No pulmonary edema. No pleural effusion. No pneumothorax. HEART AND MEDIASTINUM: No acute abnormality of the cardiac and mediastinal silhouettes. BONES AND SOFT TISSUES: No acute osseous abnormality. Contrast material in the region of the gastric fundus. IMPRESSION:  1. 1 cm ill-defined opacity laterally in the right mid lung field. Electronically signed by: Dayne Hassell MD 08/10/2023 02:02 PM EDT RP Workstation: [id number]    Assessment/Plan: This is a 58 y.o. male with ESRD needing PD catheter placement, right inguinal hernia, and umbilical hernia.  --Patient's K is improved today, down to 4.7.  Hgb improved appropriately after transfusion yesterday, up to 8.9 today.  Denies any issues from abdominal standpoint. --Will proceed with surgery today pending anesthesia team evaluation.  He could discharge home after surgery if no issues from surgical standpoint.   I spent 35 minutes dedicated to the care of this patient on the date of this encounter to include pre-visit review of records, face-to-face time with the patient discussing diagnosis and management, and any post-visit coordination of care.  Aloysius Sheree Plant, MD [facility]

## 2023-08-11 NOTE — Op Note (Signed)
 Procedure Date:  08/11/2023  Pre-operative Diagnosis:  ESRD, needing dialysis; right inguinal hernia; umbilical hernia  Post-operative Diagnosis:  ESRD, needing dialysis; right inguinal hernia; umbilical hernia 1 cm  Procedure:  Robotic assisted right inguinal hernia repair; Laparoscopic peritoneal dialysis catheter placement; open umbilical hernia repair.  Surgeon:  Aloysius Sheree Plant, MD  Anesthesia:  General endotracheal  Estimated Blood Loss:  10 ml  Specimens:  None  Complications:  Patient developed hematuria after Foley catheter was removed in the OR.  A 20 Fr. Coude catheter was inserted and Dr. Francisca was able to irrigate catheter and get blood clots out.  Indications for Procedure:  This is a 58 y.o. male who presents with ESRD requiring dialysis.  The patient has opted for peritoneal dialysis and presents for catheter placement today.  He also has a right inguinal hernia and an umbilical hernia.  Discussed the need for repairing these so they do not worsen with peritoneal dialysis.  The risks of bleeding, abscess or infection, potential for an open procedure, catheter failure, and injury to surrounding structures were all discussed with the patient and he was willing to proceed.  Description of Procedure: The patient was correctly identified in the preoperative area and brought into the operating room.  The patient was placed supine with VTE prophylaxis in place.  Appropriate time-outs were performed.  Anesthesia was induced and the patient was intubated.  Appropriate antibiotics were infused.  The abdomen was prepped and draped in a sterile fashion.  A peritoneal dialysis catheter with extension tubing were brought into the field and measurements were made for appropriate placement and location of the catheter and components in the patient's left abdominal wall.    A supraumbilical incision was made. Cautery was used to dissect along the umbilical stalk and to separate it from  the underlying fascia.  This revealed a 1 cm hernia defect.  The preperitoneal fat was resected and a 12 mm robotic port was inserted.  Pneumoperitoneum was obtained with appropriate opening pressures.  A Veress needle was used to start dissecting the peritoneal flap.  Two 8-mm robotic ports were placed in the right and left lateral positions under direct visualization.  A large right Bard 3D Max Mesh, a 2-0 Vicryl, and 2-0 stratafix suture were placed through the umbilical port under direct visualization.  The [employer] platform was docked onto the patient, the camera was inserted and targeted, and the instruments were placed under direct visualization.  Both inguinal regions were inspected for hernias and it was confirmed that the patient had a right inguinal hernia.  Using electocautery, the peritoneal and posterior rectus tissue flap was created.  The peritoneum on the right side was scored from the median umbilical ligament laterally towards the ASIS.  The flap was mobilized using robotic scissors and the bipolar instruments, creating a plane along the posterior rectus sheath and transversalis fascia down to the pubic tubercle medially. It was then further mobilized laterally across the inguinal canal and femoral vessels and onto the psoas muscle. The inferior epigastric vessels were identified and preserved. This created a posterior rectus and peritoneal flap measuring roughly 17 cm x 12 cm.  The hernia sac and contents were reduced preserving all structures.  A large right Bard 3D Max Mid mesh was placed with good overlap along all the potential hernia defects and secured in place with 2-0 Vicryl along the medial superomedial and superolateral aspects.  Then, the peritoneal flap was advanced over the mesh and carried over  to close the defect. A running 2-0 stratafix suture was used to approximate the edge of the flap onto the peritoneum.  The abdomen was then desufflated.  A 2.5 cm incision was made at the  planned entry location overlying the left rectus muscle, and cautery was used to dissect down the subcutaneous tissue to reach the anterior rectus sheath. Then, a long 5 mm port was introduced through the rectus sheath incision and pushed to the posterior sheath and tunneled inferiorly for about 5-7 cm and then pushed through the peritoneum.  The dialysis catheter was passed through the port and a laparoscopic grasper was used to place the coiled portion of the catheter in the pelvis.  The catheter was placed so that the first cuff was at the edge of the anterior rectus sheath.  At this point, pneumoperitoneum was released.  The rectus sheath was closed around the catheter cuff, and the catheter was then sized with the extension tubing for appropriate tunneling and exit in the left abdomen.  The catheter and extension tubing were connected using a titanium connector, and then the catheter was tunneled to the left upper quadrant via an additional incision and then tunneled inferiorly to it's planned exit site, about 4 cm distal to the last cuff.  There were no complications doing this, and the catheter was maintained straight without kinks.  Then the connectors were placed and the catheter was tested for flow/drainage using heparinized saline.  It had good flow both in and out.    The ports were removed.  The umbilical hernia was closed using 0 Vicryl sutures.  Local anesthetic was infused in all incisions as well as a right ilioinguinal block.  The umbilical stalk was reattached using 2-0 Vicryl sutures.  The incisions were then closed using 3-0 Vicryl and 4-0 Monocryl.  The wounds were cleaned and sealed with DermaBond.  A Biopatch was placed at the catheter exit site and the catheter was dressed with 4x4 gauze and porous tape.  Foley catheter was removed and the patient was emerged from anesthesia and extubated.  There was hematuria noted at this point.  A 20 Fr. Coude catheter was inserted and the bladder was  irrigated.  Then, Dr. Francisca with Urology came into the OR and irrigated the bladder further with water and mixture with hydrogen peroxide.  Coude catheter was left in place and connected to drainage bag.  The patient was then brought to the recovery room for further management.  The patient tolerated the procedure well and all counts were correct at the end of the case.   Aloysius Sheree Plant, MD

## 2023-08-11 NOTE — Hospital Course (Addendum)
 Taken from H&P   Trevor Meyer is a 58 y.o. male with medical history significant of CKDIV with associated AOCD as well as IDA, Aortic mural thrombus, Hypertension,  Umbilical and inguinal hernia who presented to pre-op for elective umbilical and inguinal repair. Patient labs however were notable for hyperkalemia with k  of 5.6 and due to this procedure was canceled. Patient case was discussed with on call nephrology who recommended admission to hospitalist service for treatment of hyperkalemia.   8/1:Vital stable,K improved to 4.7, Hgb at 8.9 s/p 1 unit of PRBC, Going for hernia repair and PD catheter placement today.    8/2: Remained hemodynamically stable,  Patient developed hematuria during surgery yesterday and urology was consulted,  they initially recommend manual irrigation.  Hematuria now improved.  Urology is recommending continue with Foley catheter on discharge and they will follow-up as outpatient for voiding trial.  General surgery also cleared him for discharge after the hernia repair and they will follow-up as outpatient.  Patient need to follow-up with nephrology on Monday so they can start him on peritoneal dialysis.  Patient did had some worsening of hyperkalemia to 5.7 this morning s/p another round of medical management which include insulin  with glucose, albuterol , calcium  gluconate and Lokelma , potassium on repeat was 4.5.  Patient was given daily Veltassa  to use until he starts dialysis and nephrology can stop as appropriate.  He was also started on bicarb tablet and PhosLo  to prevent hyperphosphatemia and metabolic acidosis secondary to advanced renal disease.  Patient need to continue on current medications and follow-up with his providers closely for further assistance.

## 2023-08-11 NOTE — Progress Notes (Signed)
 08/11/2023  Subjective: No acute events.  Patient denies any abdominal pain.  K improved this morning after some up/down yesterday.  Down to 4.7 today.  Hgb responded well to transfusion yesterday.  Hgb 8.9 today.  Vital signs: Temp:  [97.3 F (36.3 C)-98.4 F (36.9 C)] 98.3 F (36.8 C) (08/01 0402) Pulse Rate:  [62-77] 72 (08/01 0402) Resp:  [16-22] 18 (08/01 0402) BP: (152-168)/(83-89) 161/85 (08/01 0402) SpO2:  [98 %-100 %] 98 % (08/01 0402)   Intake/Output: 07/31 0701 - 08/01 0700 In: 572 [P.O.:180; Blood:392] Out: -  Last BM Date : 08/09/23  Physical Exam: Constitutional:  No acute distress Abdomen:  soft, non-distended, non-tender.  Stable hernias right groin and umbilicus.  Labs:  Recent Labs    08/09/23 1548 08/10/23 0652 08/10/23 1246 08/11/23 0257  WBC 4.2  --   --  4.9  HGB 7.7*   < > 9.0* 8.9*  HCT 24.1*   < > 28.3* 27.1*  PLT 157  --   --  140*   < > = values in this interval not displayed.   Recent Labs    08/10/23 0704 08/10/23 0708 08/10/23 1935 08/11/23 0257  NA 143  --   --  142  K 5.5*   < > 5.7* 4.7  CL 113*  --   --  112*  CO2  --   --   --  19*  GLUCOSE 102*  --   --  98  BUN 94*  --   --  84*  CREATININE 9.70*  --   --  8.82*  CALCIUM   --   --   --  8.5*   < > = values in this interval not displayed.   No results for input(s): LABPROT, INR in the last 72 hours.  Imaging: DG Chest 1 View Result Date: 08/10/2023 EXAM: 1 VIEW XRAY OF THE CHEST 08/10/2023 12:54:10 PM COMPARISON: 03/25/2020 radiograph and CT 03/22/2020. CLINICAL HISTORY: End stage chronic kidney disease (HCC). FINDINGS: LUNGS AND PLEURA: There is a 1 cm ill-defined opacity laterally in the right mid lung field. No pulmonary edema. No pleural effusion. No pneumothorax. HEART AND MEDIASTINUM: No acute abnormality of the cardiac and mediastinal silhouettes. BONES AND SOFT TISSUES: No acute osseous abnormality. Contrast material in the region of the gastric fundus. IMPRESSION:  1. 1 cm ill-defined opacity laterally in the right mid lung field. Electronically signed by: Dayne Hassell MD 08/10/2023 02:02 PM EDT RP Workstation: [id number]    Assessment/Plan: This is a 58 y.o. male with ESRD needing PD catheter placement, right inguinal hernia, and umbilical hernia.  --Patient's K is improved today, down to 4.7.  Hgb improved appropriately after transfusion yesterday, up to 8.9 today.  Denies any issues from abdominal standpoint. --Will proceed with surgery today pending anesthesia team evaluation.  He could discharge home after surgery if no issues from surgical standpoint.   I spent 35 minutes dedicated to the care of this patient on the date of this encounter to include pre-visit review of records, face-to-face time with the patient discussing diagnosis and management, and any post-visit coordination of care.  Aloysius Sheree Plant, MD [facility]

## 2023-08-11 NOTE — Anesthesia Procedure Notes (Signed)
 Procedure Name: Intubation Date/Time: 08/11/2023 12:37 PM  Performed by: Elly Pfeiffer, CRNAPre-anesthesia Checklist: Patient identified, Emergency Drugs available, Suction available and Patient being monitored Patient Re-evaluated:Patient Re-evaluated prior to induction Oxygen Delivery Method: Circle system utilized Preoxygenation: Pre-oxygenation with 100% oxygen Induction Type: IV induction Ventilation: Mask ventilation without difficulty Laryngoscope Size: McGrath and 4 Grade View: Grade I Tube type: Oral Tube size: 7.5 mm Number of attempts: 1 Airway Equipment and Method: Stylet and Oral airway Placement Confirmation: ETT inserted through vocal cords under direct vision, positive ETCO2 and breath sounds checked- equal and bilateral Secured at: 22 cm Tube secured with: Tape Dental Injury: Teeth and Oropharynx as per pre-operative assessment  Comments: Cords clear; anterior larynx. CA

## 2023-08-11 NOTE — Anesthesia Preprocedure Evaluation (Signed)
 Anesthesia Evaluation  Patient identified by MRN, date of birth, ID band Patient awake    Reviewed: Allergy & Precautions, H&P , NPO status , Patient's Chart, lab work & pertinent test results, reviewed documented beta blocker date and time   Airway Mallampati: II  TM Distance: >3 FB Neck ROM: full    Dental  (+) Teeth Intact   Pulmonary shortness of breath and with exertion   Pulmonary exam normal        Cardiovascular Exercise Tolerance: Poor hypertension, On Medications negative cardio ROS Normal cardiovascular exam Rhythm:regular Rate:Normal     Neuro/Psych negative neurological ROS  negative psych ROS   GI/Hepatic negative GI ROS, Neg liver ROS,,,  Endo/Other  negative endocrine ROS    Renal/GU ESRFRenal disease  negative genitourinary   Musculoskeletal   Abdominal   Peds  Hematology  (+) Blood dyscrasia, anemia   Anesthesia Other Findings Past Medical History: No date: Anemia of chronic renal failure No date: Aortic atherosclerosis (HCC) 03/21/2020: Aortic mural thrombus (HCC)     Comment:  a.) CTA chest 03/21/2020:  acute aortic syndrome with               intramural hematoma extending from the LEFT subclavian               artery origin to the diaphragmatic hiatus No date: CKD (chronic kidney disease), stage V (HCC) 2000: Clotting disorder (HCC) 03/2020: Hypertension No date: Iron  deficiency anemia No date: Polycystic kidney disease No date: Right inguinal hernia No date: Secondary hyperparathyroidism of renal origin (HCC) No date: Umbilical hernia History reviewed. No pertinent surgical history.   Reproductive/Obstetrics negative OB ROS                              Anesthesia Physical Anesthesia Plan  ASA: 3  Anesthesia Plan: General ETT   Post-op Pain Management:    Induction:   PONV Risk Score and Plan: 3  Airway Management Planned:   Additional  Equipment:   Intra-op Plan:   Post-operative Plan:   Informed Consent: I have reviewed the patients History and Physical, chart, labs and discussed the procedure including the risks, benefits and alternatives for the proposed anesthesia with the patient or authorized representative who has indicated his/her understanding and acceptance.     Dental Advisory Given  Plan Discussed with: CRNA  Anesthesia Plan Comments:         Anesthesia Quick Evaluation

## 2023-08-11 NOTE — TOC CM/SW Note (Signed)
 Transition of Care [facility]) - Inpatient Brief Assessment   Patient Details  Name: Jaymond Waage MRN: [id number] Date of Birth: [birth year]  Transition of Care [facility]) CM/SW Contact:    Corean ONEIDA Haddock, RN Phone Number: 08/11/2023, 11:11 AM   Clinical Narrative:   Transition of Care [facility]) Screening Note   Patient Details  Name: Ravi Tuccillo Date of Birth: 1965/04/11   Transition of Care [facility]) CM/SW Contact:    Corean ONEIDA Haddock, RN Phone Number: 08/11/2023, 11:11 AM    Transition of Care Department [facility]) has reviewed patient and no TOC needs have been identified at this time.  If new patient transition needs arise, please place a TOC consult.    Transition of Care Asessment: Insurance and Status: Insurance coverage has been reviewed Patient has primary care physician: Yes     Prior/Current Home Services: No current home services Social Drivers of Health Review: SDOH reviewed no interventions necessary Readmission risk has been reviewed: Yes Transition of care needs: no transition of care needs at this time

## 2023-08-11 NOTE — Assessment & Plan Note (Signed)
 Umbilical hernia PD catheter placement. Patient is going to OR today for hernia repair and PD catheter placement.

## 2023-08-11 NOTE — Discharge Instructions (Signed)
  Diet: Resume home renal diet.   Activity: No heavy lifting >20 pounds (children, pets, laundry, garbage) or strenuous activity until follow-up, but light activity and walking are encouraged. Do not drive or drink alcohol  if taking narcotic pain medications.  Wound care: Do not remove the dressing from the peritoneal catheter. This will be changed by a dialysis team. Do not shower until the dressing is changed but can do sponge bath.   Medications: Resume all home medications. For mild to moderate pain: acetaminophen  (Tylenol ) or ibuprofen (if no kidney disease). Combining Tylenol  with alcohol  can substantially increase your risk of causing liver disease. Narcotic pain medications, if prescribed, can be used for severe pain, though may cause nausea, constipation, and drowsiness. If you do not need the narcotic pain medication, you do not need to fill the prescription.  Call office [phone]) at any time if any questions, worsening pain, fevers/chills, bleeding, drainage from incision site, or other concerns.

## 2023-08-11 NOTE — Interval H&P Note (Signed)
 History and Physical Interval Note:  08/11/2023 12:24 PM  Trevor Meyer  has presented today for surgery, with the diagnosis of right inguinal hernia umbilical hernia  ESRD.  The various methods of treatment have been discussed with the patient and family. After consideration of risks, benefits and other options for treatment, the patient has consented to  Procedure(s): HERNIORRHAPHY, INGUINAL, ROBOT-ASSISTED, LAPAROSCOPIC (Right) REPAIR, HERNIA, UMBILICAL, ADULT (N/A) LAPAROSCOPIC INSERTION CONTINUOUS AMBULATORY PERITONEAL DIALYSIS  (CAPD) CATHETER (N/A) as a surgical intervention.  The patient's history has been reviewed, patient examined, no change in status, stable for surgery.  I have reviewed the patient's chart and labs.  Questions were answered to the patient's satisfaction.     Brindle Leyba

## 2023-08-11 NOTE — Progress Notes (Signed)
    4:09 PM   Nolyn Swab 15-Jul-1965 [id number]  While I was in OR 10 performing a surgery I was consulted by Dr. Desiderio about concern for gross hematuria after finishing a case.  There was concern that the balloon had potentially been inflated in the prostate.  Since I could not immediately leave the operating room, Dr. Desiderio placed a 20 French coud Foley with return of pink urine.  When I finished my case I saw the patient in OR for when he was still sedated.  I irrigated the catheter with approximately 1 L of sterile water, with return of 20ml old clot.  The catheter irrigated easily and remained clear.  Catheter was connected to drainage.  -Recommend catheter stay in place until at least Monday.  Urology can coordinate follow-up if needed for voiding trial - Can hand irrigate catheter with Toomey syringe as needed  Redell JAYSON Burnet, MD  [facility] [address] [place], [place] [address] [phone]

## 2023-08-11 NOTE — Assessment & Plan Note (Addendum)
 Received 1 unit of PRBC preoperatively. - Continue with iron  supplement -Venofer after the procedure while inpatient as recommended by hematology-ordered for tomorrow

## 2023-08-11 NOTE — OR Nursing (Signed)
 Dr. Desiderio consulted with Dr. Vona concerning blood in patients urine. Dr. Vona performed bladder irrigation.

## 2023-08-11 NOTE — Assessment & Plan Note (Signed)
 MRI on 08/02/2023 noted previously observed intramural hematoma involving the thoracic aorta I significantly improved. - Outpatient follow-up with vascular surgery

## 2023-08-11 NOTE — Progress Notes (Signed)
 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  Patient presents to the hospital for scheduled surgical procedure for PD catheter placement and was found to have an elevated potassium level.  Patient now admitted for Right inguinal hernia [K40.90] Umbilical hernia without obstruction and without gangrene [K42.9] End stage renal disease (HCC) [N18.6] Hyperkalemia [E87.5]  Patient is known to our practice and is followed outpatient by Dr. Dennise.   Patient seen prior to surgical procedure.  Patient  n.p.o. for possible PD catheter placement No complaints to offer at this time.   Objective:  Vital signs in last 24 hours:  Temp:  [97.6 F (36.4 C)-98.6 F (37 C)] 98.6 F (37 C) (08/01 1140) Pulse Rate:  [56-73] 62 (08/01 1140) Resp:  [16-18] 16 (08/01 1140) BP: (159-168)/(82-109) 159/109 (08/01 1146) SpO2:  [98 %-100 %] 100 % (08/01 1140)  Weight change:  There were no vitals filed for this visit.  Intake/Output: I/O last 3 completed shifts: In: 572 [P.O.:180; Blood:392] Out: -    Intake/Output this shift:  Total I/O In: 200 [I.V.:100; IV Piggyback:100] Out: 105 [Urine:100; Blood:5]  Physical Exam: General: NAD  Head: Normocephalic, atraumatic. Moist oral mucosal membranes  Eyes: Anicteric  Neck: Supple  Lungs:  Clear to auscultation, normal effort  Heart: Regular rate and rhythm  Abdomen:  Soft, nontender  Extremities: No peripheral edema.  Neurologic: Awake, alert, conversant  Skin: Warm,dry, no rash  Access: None    Basic Metabolic Panel: Recent Labs  Lab 08/10/23 0652 08/10/23 0704 08/10/23 0708 08/10/23 1246 08/10/23 1538 08/10/23 1935 08/11/23 0257  NA 143 143  --   --   --   --  142  K 5.5* 5.5* 5.6* 4.9 5.3* 5.7* 4.7  CL 112* 113*  --   --   --   --   112*  CO2  --   --   --   --   --   --  19*  GLUCOSE 102* 102*  --   --   --   --  98  BUN 93* 94*  --   --   --   --  84*  CREATININE 9.80* 9.70*  --   --   --   --  8.82*  CALCIUM   --   --   --   --   --   --  8.5*    Liver Function Tests: Recent Labs  Lab 08/11/23 0257  AST 10*  ALT 9  ALKPHOS 36*  BILITOT 0.6  PROT 7.1  ALBUMIN 3.5   No results for input(s): LIPASE, AMYLASE in the last 168 hours. No results for input(s): AMMONIA in the last 168 hours.  CBC: Recent Labs  Lab 08/09/23 1548 08/10/23 0652 08/10/23 0704 08/10/23 1246 08/11/23 0257  WBC 4.2  --   --   --  4.9  NEUTROABS 2.9  --   --   --   --   HGB 7.7* 7.5* 7.5* 9.0* 8.9*  HCT 24.1* 22.0* 22.0* 28.3* 27.1*  MCV 91.3  --   --   --  87.4  PLT 157  --   --   --  140*    Cardiac Enzymes: No results for input(s): CKTOTAL, CKMB, CKMBINDEX, TROPONINI in the last 168 hours.  BNP: Invalid input(s): POCBNP  CBG: Recent Labs  Lab 08/10/23 1202 08/10/23 1609 08/10/23 2137 08/11/23 0912  GLUCAP 170* 117* 107* 83    Microbiology: Results for orders placed or performed during the hospital encounter of 03/21/20  Resp Panel by RT-PCR (Flu A&B, Covid) Nasopharyngeal Swab     Status: None   Collection Time: 03/21/20 11:32 PM   Specimen: Nasopharyngeal Swab; Nasopharyngeal(NP) swabs in vial transport medium  Result Value Ref Range Status   SARS Coronavirus 2 by RT PCR NEGATIVE NEGATIVE Final    Comment: (NOTE) SARS-CoV-2 target nucleic acids are NOT DETECTED.  The SARS-CoV-2 RNA is generally detectable in upper respiratory specimens during the acute phase of infection. The lowest concentration of SARS-CoV-2 viral copies this assay can detect is 138 copies/mL. A negative result does not preclude SARS-Cov-2 infection and should not be used as the sole basis for treatment or other patient management decisions. A negative result may occur with  improper specimen collection/handling,  submission of specimen other than nasopharyngeal swab, presence of viral mutation(s) within the areas targeted by this assay, and inadequate number of viral copies(<138 copies/mL). A negative result must be combined with clinical observations, patient history, and epidemiological information. The expected result is Negative.  Fact Sheet for Patients:  BloggerCourse.com  Fact Sheet for Healthcare Providers:  SeriousBroker.it  This test is no t yet approved or cleared by the [place]  FDA and  has been authorized for detection and/or diagnosis of SARS-CoV-2 by FDA under an Emergency Use Authorization (EUA). This EUA will remain  in effect (meaning this test can be used) for the duration of the COVID-19 declaration under Section 564(b)(1) of the Act, 21 U.S.C.section 360bbb-3(b)(1), unless the authorization is terminated  or revoked sooner.       Influenza A by PCR NEGATIVE NEGATIVE Final   Influenza B by PCR NEGATIVE NEGATIVE Final    Comment: (NOTE) The Xpert Xpress SARS-CoV-2/FLU/RSV plus assay is intended as an aid in the diagnosis of influenza from Nasopharyngeal swab specimens and should not be used as a sole basis for treatment. Nasal washings and aspirates are unacceptable for Xpert Xpress SARS-CoV-2/FLU/RSV testing.  Fact Sheet for Patients: BloggerCourse.com  Fact Sheet for Healthcare Providers: SeriousBroker.it  This test is not yet approved or cleared by the [place]  FDA and has been authorized for detection and/or diagnosis of SARS-CoV-2 by FDA under an Emergency Use Authorization (EUA). This EUA will remain in effect (meaning this test can be used) for the duration of the COVID-19 declaration under Section 564(b)(1) of the Act, 21 U.S.C. section 360bbb-3(b)(1), unless the authorization is terminated or revoked.  Performed at [facility], [address] [place], [place] [address]   MRSA PCR Screening     Status: None   Collection Time: 03/22/20  6:18 AM   Specimen: Nasopharyngeal  Result Value Ref Range Status   MRSA by PCR NEGATIVE NEGATIVE Final    Comment:        The GeneXpert MRSA Assay (FDA approved for NASAL specimens only), is one component of a comprehensive MRSA colonization surveillance program. It is not intended to diagnose MRSA infection nor to guide or monitor treatment for MRSA infections. Performed at [facility], [address], [place], [place] [address]   Urine Culture     Status: Abnormal   Collection Time: 03/23/20 11:44 PM   Specimen: Urine, Clean Catch  Result Value Ref Range Status   Specimen Description   Final    URINE, CLEAN CATCH Performed at [facility], [address], [place], [place] [address]    Special Requests   Final    NONE Performed at [facility], [address] [place], [place] [address]    Culture (A)  Final    10,000 COLONIES/mL GROUP B STREP(S.AGALACTIAE)ISOLATED TESTING AGAINST S. AGALACTIAE NOT ROUTINELY PERFORMED DUE TO PREDICTABILITY OF AMP/PEN/VAN SUSCEPTIBILITY. Performed at [facility], [address] [place], [place] [address]    Report Status 03/25/2020 FINAL  Final    Coagulation Studies: No results for input(s): LABPROT, INR in the last 72 hours.  Urinalysis: No results for input(s): COLORURINE, LABSPEC, PHURINE, GLUCOSEU, HGBUR, BILIRUBINUR, KETONESUR, PROTEINUR, UROBILINOGEN, NITRITE, LEUKOCYTESUR in the last 72 hours.  Invalid input(s): APPERANCEUR    Imaging: DG Chest 1 View Result Date: 08/10/2023 EXAM: 1 VIEW XRAY OF THE CHEST 08/10/2023 12:54:10 PM COMPARISON: 03/25/2020 radiograph and CT 03/22/2020. CLINICAL HISTORY: End stage chronic kidney disease (HCC). FINDINGS: LUNGS AND PLEURA: There is a 1 cm ill-defined opacity laterally in the right mid lung field. No pulmonary  edema. No pleural effusion. No pneumothorax. HEART AND MEDIASTINUM: No acute abnormality of the cardiac and mediastinal silhouettes. BONES AND SOFT TISSUES: No acute osseous abnormality. Contrast material in the region of the gastric fundus. IMPRESSION: 1. 1 cm ill-defined opacity laterally in the right mid lung field. Electronically signed by: Dayne Hassell MD 08/10/2023 02:02 PM EDT RP Workstation: [id number]     Medications:    [START ON 08/12/2023] iron  sucrose      [MAR Hold] amLODipine   10 mg Oral Daily   [MAR Hold] cloNIDine   0.1 mg Oral BID   [MAR Hold] furosemide   40 mg Oral Daily   [MAR Hold] insulin  aspart  0-5 Units Subcutaneous QHS   [MAR Hold] insulin  aspart  0-6 Units Subcutaneous TID WC   [MAR Hold] iron  polysaccharides  150 mg Oral Daily   [MAR Hold] isosorbide  mononitrate  30 mg Oral Daily   [MAR Hold] labetalol   200 mg Oral BID   [MAR Hold] pantoprazole   40 mg Oral Daily   [MAR Hold] patiromer   16.8 g Oral Daily   sodium zirconium cyclosilicate   10 g Oral BID   0.9 % irrigation (POUR BTL), [MAR Hold] acetaminophen  **OR** [MAR Hold] acetaminophen , [MAR Hold] albuterol , [MAR Hold] artificial tears, bupivacaine liposome, bupivacaine-EPINEPHrine, heparin  5000 units / NS 1000 mL (flush), [MAR Hold] ondansetron  **OR** [MAR Hold] ondansetron  (ZOFRAN ) IV  Assessment/ Plan:  Mr. Trevor Meyer is a 58 y.o.  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  Patient presents to the hospital for scheduled surgical procedure for PD catheter placement and was found to have an elevated potassium level.  Patient now admitted for Right inguinal hernia [K40.90] Umbilical hernia without obstruction and without gangrene [K42.9] End stage renal disease (HCC) [N18.6] Hyperkalemia [E87.5]   Hyperkalemia with end-stage renal disease.  Potassium 4.7, scheduled for PD catheter placement  today.  Dialysis navigator notified of patient and will initiate urgent start at [facility].  Will request urgent start as outpatient.  2. Anemia of chronic kidney disease Lab Results  Component Value Date   HGB 8.9 (L) 08/11/2023  Will continue to monitor hemoglobin as patient will likely require ESA outpatient.  3. Secondary Hyperparathyroidism: with outpatient labs: PTH 89, phosphorus 5.9, calcium  10.7 on 06/29/23.   Lab Results  Component Value Date   PTH 28 06/03/2023   CALCIUM  8.5 (L) 08/11/2023   CAION 1.12 (  L) 08/10/2023   PHOS 5.5 (H) 06/05/2023    Calcium  within optimal range.  Will continue to monitor bone minerals.  4.  Hypertension with chronic kidney disease.  Currently receiving amlodipine , clonidine , furosemide , isosorbide , and labetalol .   LOS: 1 Zeeshan Korte 8/1/20253:07 PM

## 2023-08-11 NOTE — Progress Notes (Signed)
  Progress Note   Patient: Trevor Meyer FMW:[id number] DOB: August 30, 1965 DOA: 08/10/2023     1 DOS: the patient was seen and examined on 08/11/2023   Brief hospital course: Taken from H&P   Hjalmer Iovino is a 58 y.o. male with medical history significant of CKDIV with associated AOCD as well as IDA, Aortic mural thrombus, Hypertension,  Umbilical and inguinal hernia who presented to pre-op for elective umbilical and inguinal repair. Patient labs however were notable for hyperkalemia with k  of 5.6 and due to this procedure was canceled. Patient case was discussed with on call nephrology who recommended admission to hospitalist service for treatment of hyperkalemia.   8/1:Vital stable,K improved to 4.7, Hgb at 8.9 s/p 1 unit of PRBC, Going for hernia repair and PD catheter placement today.  Nephrology would like to do emergent training and start on peritoneal dialysis over the weekend and requesting to keep the patient until Monday.  Assessment and Plan: * Hyperkalemia Improved with medical management, likely secondary to advanced renal disease. -Continue to monitor  Right inguinal hernia Umbilical hernia PD catheter placement. Patient is going to OR today for hernia repair and PD catheter placement.  CKD (chronic kidney disease), stage V (HCC) History of polycystic kidney disease. Now ESRD and going to be started on PD. PD was planned as outpatient but patient was missing some appointments to initiate.  PD catheter to be placed today and nephrology would like to start emergently over the weekend with some urgent training as he presented with hyperkalemia. - Nephrology is on board and managing  Aortic mural thrombus (HCC) MRI on 08/02/2023 noted previously observed intramural hematoma involving the thoracic aorta I significantly improved. - Outpatient follow-up with vascular surgery  Hypertension - Continue home antihypertensives which include clonidine , amlodipine  and labetalol   Iron   deficiency anemia Received 1 unit of PRBC preoperatively. - Continue with iron  supplement -Venofer after the procedure while inpatient as recommended by hematology-ordered for tomorrow   Subjective: Patient was seen and examined before the surgery.  No new concern.  Physical Exam: Vitals:   08/11/23 0402 08/11/23 0911 08/11/23 1140 08/11/23 1146  BP: (!) 161/85 (!) 168/82  (!) 159/109  Pulse: 72 (!) 56 62   Resp: 18 16 16    Temp: 98.3 F (36.8 C) 97.6 F (36.4 C) 98.6 F (37 C)   TempSrc: Oral  Temporal   SpO2: 98% 100% 100%    General.  Well-developed gentleman, in no acute distress. Pulmonary.  Lungs clear bilaterally, normal respiratory effort. CV.  Regular rate and rhythm, no JVD, rub or murmur. Abdomen.  Soft, nontender, nondistended, BS positive. CNS.  Alert and oriented .  No focal neurologic deficit. Extremities.  No edema, no cyanosis, pulses intact and symmetrical. Psychiatry.  Judgment and insight appears normal.   Data Reviewed: Prior data reviewed  Family Communication: Discussed with patient  Disposition: Status is: Inpatient Remains inpatient appropriate because: Severity of illness  Planned Discharge Destination: Home  Time spent: 50 minutes  This record has been created using Conservation officer, historic buildings. Errors have been sought and corrected,but may not always be located. Such creation errors do not reflect on the standard of care.   Author: Amaryllis Dare, MD 08/11/2023 2:27 PM  For on call review [email or web address].

## 2023-08-11 NOTE — Progress Notes (Signed)
   08/11/23 2012  Assess: MEWS Score  Temp 100 F (37.8 C)  BP (!) 153/98  MAP (mmHg) 117  Pulse Rate (!) 108  Resp 12  SpO2 97 %  O2 Device Room Air  Assess: MEWS Score  MEWS Temp 0  MEWS Systolic 0  MEWS Pulse 1  MEWS RR 1  MEWS LOC 0  MEWS Score 2  MEWS Score Color Yellow  Assess: if the MEWS score is Yellow or Red  Were vital signs accurate and taken at a resting state? Yes  Does the patient meet 2 or more of the SIRS criteria? No  Notify: Charge Nurse/RN  Name of Charge Nurse/RN Notified Trish  Assess: SIRS CRITERIA  SIRS Temperature  0  SIRS Respirations  0  SIRS Pulse 1  SIRS WBC 0  SIRS Score Sum  1

## 2023-08-12 DIAGNOSIS — N186 End stage renal disease: Secondary | ICD-10-CM | POA: Diagnosis not present

## 2023-08-12 DIAGNOSIS — K4090 Unilateral inguinal hernia, without obstruction or gangrene, not specified as recurrent: Secondary | ICD-10-CM | POA: Diagnosis not present

## 2023-08-12 DIAGNOSIS — E875 Hyperkalemia: Secondary | ICD-10-CM | POA: Diagnosis not present

## 2023-08-12 DIAGNOSIS — K429 Umbilical hernia without obstruction or gangrene: Secondary | ICD-10-CM | POA: Diagnosis not present

## 2023-08-12 LAB — GLUCOSE, CAPILLARY
Glucose-Capillary: 93 mg/dL (ref 70–99)
Glucose-Capillary: 169 mg/dL — ABNORMAL HIGH (ref 70–99)
Glucose-Capillary: 102 mg/dL — ABNORMAL HIGH (ref 70–99)
Glucose-Capillary: 119 mg/dL — ABNORMAL HIGH (ref 70–99)

## 2023-08-12 LAB — CBC
HCT: 27.2 % — ABNORMAL LOW (ref 39.0–52.0)
Hemoglobin: 8.9 g/dL — ABNORMAL LOW (ref 13.0–17.0)
MCH: 29.4 pg (ref 26.0–34.0)
MCHC: 32.7 g/dL (ref 30.0–36.0)
MCV: 89.8 fL (ref 80.0–100.0)
Platelets: 135 K/uL — ABNORMAL LOW (ref 150–400)
RBC: 3.03 MIL/uL — ABNORMAL LOW (ref 4.22–5.81)
RDW: 14.4 % (ref 11.5–15.5)
WBC: 6.5 K/uL (ref 4.0–10.5)
nRBC: 0 % (ref 0.0–0.2)

## 2023-08-12 LAB — RENAL FUNCTION PANEL
Albumin: 3.6 g/dL (ref 3.5–5.0)
Anion gap: 13 (ref 5–15)
BUN: 84 mg/dL — ABNORMAL HIGH (ref 6–20)
CO2: 17 mmol/L — ABNORMAL LOW (ref 22–32)
Calcium: 8.6 mg/dL — ABNORMAL LOW (ref 8.9–10.3)
Chloride: 110 mmol/L (ref 98–111)
Creatinine, Ser: 8.9 mg/dL — ABNORMAL HIGH (ref 0.61–1.24)
GFR, Estimated: 6 mL/min — ABNORMAL LOW (ref >60)
Glucose, Bld: 132 mg/dL — ABNORMAL HIGH (ref 70–99)
Phosphorus: 6.9 mg/dL — ABNORMAL HIGH (ref 2.5–4.6)
Potassium: 5.7 mmol/L — ABNORMAL HIGH (ref 3.5–5.1)
Sodium: 140 mmol/L (ref 135–145)

## 2023-08-12 LAB — POTASSIUM
Potassium: 5.2 mmol/L — ABNORMAL HIGH (ref 3.5–5.1)
Potassium: 4.5 mmol/L (ref 3.5–5.1)
Potassium: 4.6 mmol/L (ref 3.5–5.1)

## 2023-08-12 MED ORDER — CHLORHEXIDINE GLUCONATE CLOTH 2 % EX PADS
6.0000 | MEDICATED_PAD | Freq: Every day | CUTANEOUS | Status: DC
  Administered 2023-08-12: 6 via TOPICAL

## 2023-08-12 MED ORDER — DEXTROSE 50 % IV SOLN
1.0000 | Freq: Once | INTRAVENOUS | Status: AC
  Administered 2023-08-12: 50 mL via INTRAVENOUS
  Filled 2023-08-12: qty 50

## 2023-08-12 MED ORDER — SODIUM BICARBONATE 650 MG PO TABS: 1300.0000 mg | ORAL_TABLET | Freq: Three times a day (TID) | ORAL | 1 refills | Status: DC

## 2023-08-12 MED ORDER — LOKELMA 5 G PO PACK: 10.0000 g | PACK | Freq: Every day | ORAL | 0 refills | Status: AC

## 2023-08-12 MED ORDER — OXYBUTYNIN CHLORIDE 5 MG PO TABS
5.0000 mg | ORAL_TABLET | Freq: Three times a day (TID) | ORAL | Status: DC | PRN
  Administered 2023-08-12: 5 mg via ORAL
  Filled 2023-08-12: qty 1

## 2023-08-12 MED ORDER — SODIUM BICARBONATE 8.4 % IV SOLN
50.0000 meq | Freq: Once | INTRAVENOUS | Status: AC
  Administered 2023-08-12: 50 meq via INTRAVENOUS
  Filled 2023-08-12: qty 50

## 2023-08-12 MED ORDER — PATIROMER SORBITEX CALCIUM 8.4 G PO PACK
16.8000 g | PACK | Freq: Every day | ORAL | 0 refills | Status: DC
Start: 2023-08-13 — End: 2023-09-01

## 2023-08-12 MED ORDER — OXYBUTYNIN CHLORIDE 5 MG PO TABS: 5.0000 mg | ORAL_TABLET | Freq: Three times a day (TID) | ORAL | 0 refills | Status: DC | PRN

## 2023-08-12 MED ORDER — CALCIUM GLUCONATE-NACL 1-0.675 GM/50ML-% IV SOLN
1.0000 g | Freq: Once | INTRAVENOUS | Status: AC
  Administered 2023-08-12: 1000 mg via INTRAVENOUS
  Filled 2023-08-12: qty 50

## 2023-08-12 MED ORDER — ONDANSETRON HCL 4 MG PO TABS: 4.0000 mg | ORAL_TABLET | Freq: Four times a day (QID) | ORAL | 0 refills | Status: DC | PRN

## 2023-08-12 MED ORDER — INSULIN ASPART 100 UNIT/ML IV SOLN
5.0000 [IU] | Freq: Once | INTRAVENOUS | Status: AC
  Administered 2023-08-12: 5 [IU] via INTRAVENOUS
  Filled 2023-08-12: qty 0.05

## 2023-08-12 MED ORDER — ALBUTEROL SULFATE (2.5 MG/3ML) 0.083% IN NEBU
10.0000 mg | INHALATION_SOLUTION | Freq: Once | RESPIRATORY_TRACT | Status: AC
  Administered 2023-08-12: 10 mg via RESPIRATORY_TRACT
  Filled 2023-08-12: qty 12

## 2023-08-12 MED ORDER — CALCIUM ACETATE (PHOS BINDER) 667 MG PO CAPS
1334.0000 mg | ORAL_CAPSULE | Freq: Three times a day (TID) | ORAL | Status: DC
  Administered 2023-08-12: 1334 mg via ORAL
  Filled 2023-08-12: qty 2

## 2023-08-12 MED ORDER — SODIUM BICARBONATE 650 MG PO TABS
1300.0000 mg | ORAL_TABLET | Freq: Three times a day (TID) | ORAL | Status: DC
  Administered 2023-08-12 (×2): 1300 mg via ORAL
  Filled 2023-08-12 (×2): qty 2

## 2023-08-12 MED ORDER — CALCIUM ACETATE (PHOS BINDER) 667 MG PO CAPS: 1334.0000 mg | ORAL_CAPSULE | Freq: Three times a day (TID) | ORAL | 1 refills | Status: DC

## 2023-08-12 MED ORDER — SODIUM ZIRCONIUM CYCLOSILICATE 10 G PO PACK
10.0000 g | PACK | Freq: Once | ORAL | Status: AC
  Administered 2023-08-12: 10 g via ORAL
  Filled 2023-08-12: qty 1

## 2023-08-12 NOTE — Progress Notes (Signed)
 Patient ID: Trevor Meyer, male   DOB: 08-26-1965, 58 y.o.   MRN: [id number]     SURGICAL PROGRESS NOTE   Hospital Day(s): 1.   Interval History: Patient seen and examined, no acute events or new complaints overnight. Patient reports feeling sore on the incisions.  Pain currently controlled with current pain medications.  Patient having concern of Foley catheter.  No nausea or vomiting.  Passing gas.  Vital signs in last 24 hours: [min-max] current  Temp:  [97.4 F (36.3 C)-100 F (37.8 C)] 98.5 F (36.9 C) (08/02 0724) Pulse Rate:  [56-108] 76 (08/02 0724) Resp:  [13-18] 16 (08/02 0724) BP: (150-182)/(81-110) 182/94 (08/02 0724) SpO2:  [97 %-100 %] 99 % (08/02 0724)             Physical Exam:  Constitutional: alert, cooperative and no distress  Respiratory: breathing non-labored at rest  Cardiovascular: regular rate and sinus rhythm  Gastrointestinal: soft, tender on incisions area, and non-distended.  Peritoneal dialysis catheter in place without complication  Labs:     Latest Ref Rng & Units 08/12/2023    3:30 AM 08/11/2023    2:57 AM 08/10/2023   12:46 PM  CBC  WBC 4.0 - 10.5 K/uL 6.5  4.9    Hemoglobin 13.0 - 17.0 g/dL 8.9  8.9  9.0   Hematocrit 39.0 - 52.0 % 27.2  27.1  28.3   Platelets 150 - 400 K/uL 135  140        Latest Ref Rng & Units 08/12/2023    3:30 AM 08/11/2023    2:57 AM 08/10/2023    7:35 PM  CMP  Glucose 70 - 99 mg/dL 867  98    BUN 6 - 20 mg/dL 84  84    Creatinine 9.38 - 1.24 mg/dL 1.09  1.17    Sodium 864 - 145 mmol/L 140  142    Potassium 3.5 - 5.1 mmol/L 5.7  4.7  5.7   Chloride 98 - 111 mmol/L 110  112    CO2 22 - 32 mmol/L 17  19    Calcium  8.9 - 10.3 mg/dL 8.6  8.5    Total Protein 6.5 - 8.1 g/dL  7.1    Total Bilirubin 0.0 - 1.2 mg/dL  0.6    Alkaline Phos 38 - 126 U/L  36    AST 15 - 41 U/L  10    ALT 0 - 44 U/L  9      Imaging studies: No new pertinent imaging studies   Assessment/Plan:  58 y.o. male with umbilical hernia, right get  hernia, end-stage renal disease 1 Day Post-Op s/p robotic assisted laparoscopic repair of right inguinal hernia, open umbilical hernia repair and laparoscopic peritoneal dialysis placement, complicated by pertinent comorbidities including gross hematuria with Foley catheter in place.  -Patient evaluated today and there is no clinical alteration. - Expected soreness of the incision area and abdomen.  This should be controlled with oral pain medications.  Patient also using ice pack - Hematuria on Foley catheter slowly clearing out.  Still with dark blood mixed with the urine.  No need for flushing overnight.  Further recommendations for his hematuria from urology.  Patient expected to be discharged with Foley catheter in place - From surgical standpoint he can be discharged home when medically stable.  Follow-up with Dr. Desiderio in 2-weeks, postoperative care instructions in chart and pain medication sent to pharmacy.  Lucas Petrin, MD

## 2023-08-12 NOTE — Plan of Care (Signed)
   Problem: Education: Goal: Knowledge of General Education information will improve Description Including pain rating scale, medication(s)/side effects and non-pharmacologic comfort measures Outcome: Progressing

## 2023-08-12 NOTE — Progress Notes (Signed)
 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  Patient presents to the hospital for scheduled surgical procedure for PD catheter placement and was found to have an elevated potassium level.  Patient now admitted for Right inguinal hernia [K40.90] Umbilical hernia without obstruction and without gangrene [K42.9] End stage renal disease (HCC) [N18.6] Hyperkalemia [E87.5]   Patient is known to our practice and is followed outpatient by Dr. Dennise.  Patient seen and evaluated at bedside. Family at bedside. PD catheter placed today, endorses severe pain.  Patient has concern of blood in Foley and would rather not be discharged until resolution of severe pain.  Patient states he would not be able to come to outpatient and start PD training with all the pain.  Objective:  Vital signs in last 24 hours:  Temp:  [97.4 F (36.3 C)-100 F (37.8 C)] 98.7 F (37.1 C) (08/02 1300) Pulse Rate:  [72-108] 76 (08/02 0724) Resp:  [13-18] 16 (08/02 0724) BP: (150-182)/(81-110) 182/94 (08/02 0724) SpO2:  [97 %-100 %] 98 % (08/02 0907)  Weight change:  There were no vitals filed for this visit.  Intake/Output: I/O last 3 completed shifts: In: 930 [P.O.:420; I.V.:300; IV Piggyback:210] Out: 805 [Urine:800; Blood:5]   Intake/Output this shift:  Total I/O In: 50 [IV Piggyback:50] Out: -   Physical Exam: General: Distress from pain  Head: Normocephalic, atraumatic  Eyes: Anicteric  Neck: Supple  Lungs:  Clear   Heart: Tachycardic  Abdomen:  guarded  Extremities:  None peripheral edema.  Neurologic: Alert  Skin: No rashes  Access: PD tenckhoff    Basic Metabolic Panel: Recent Labs  Lab 08/10/23 0652 08/10/23 0704 08/10/23 0708 08/10/23 1538 08/10/23 1935 08/11/23 0257 08/12/23 0330  08/12/23 0826  NA 143 143  --   --   --  142 140  --   K 5.5* 5.5*   < > 5.3* 5.7* 4.7 5.7* 5.2*  CL 112* 113*  --   --   --  112* 110  --   CO2  --   --   --   --   --  19* 17*  --   GLUCOSE 102* 102*  --   --   --  98 132*  --   BUN 93* 94*  --   --   --  84* 84*  --   CREATININE 9.80* 9.70*  --   --   --  8.82* 8.90*  --   CALCIUM   --   --   --   --   --  8.5* 8.6*  --   PHOS  --   --   --   --   --   --  6.9*  --    < > = values in this interval not displayed.    Liver Function Tests: Recent Labs  Lab 08/11/23 0257 08/12/23 0330  AST 10*  --   ALT 9  --   ALKPHOS 36*  --   BILITOT 0.6  --   PROT 7.1  --   ALBUMIN 3.5 3.6   No results for input(s): LIPASE, AMYLASE in the last 168 hours. No results for input(s): AMMONIA in the last 168 hours.  CBC: Recent Labs  Lab 08/09/23 1548 08/10/23 0652 08/10/23 0704 08/10/23 1246 08/11/23 0257 08/12/23 0330  WBC 4.2  --   --   --  4.9 6.5  NEUTROABS 2.9  --   --   --   --   --   HGB 7.7* 7.5* 7.5* 9.0* 8.9* 8.9*  HCT 24.1* 22.0* 22.0* 28.3* 27.1* 27.2*  MCV 91.3  --   --   --  87.4 89.8  PLT 157  --   --   --  140* 135*    Cardiac Enzymes: No results for input(s): CKTOTAL, CKMB, CKMBINDEX, TROPONINI in the last 168 hours.  BNP: Invalid input(s): POCBNP  CBG: Recent Labs  Lab 08/11/23 0912 08/11/23 2026 08/12/23 0726 08/12/23 1021 08/12/23 1129  GLUCAP 83 114* 93 169* 102*    Microbiology: Results for orders placed or performed during the hospital encounter of 03/21/20  Resp Panel by RT-PCR (Flu A&B, Covid) Nasopharyngeal Swab     Status: None   Collection Time: 03/21/20 11:32 PM   Specimen: Nasopharyngeal Swab; Nasopharyngeal(NP) swabs in vial transport medium  Result Value Ref Range Status   SARS Coronavirus 2 by RT PCR NEGATIVE NEGATIVE Final    Comment: (NOTE) SARS-CoV-2 target nucleic acids are NOT DETECTED.  The SARS-CoV-2 RNA is generally detectable in upper respiratory specimens  during the acute phase of infection. The lowest concentration of SARS-CoV-2 viral copies this assay can detect is 138 copies/mL. A negative result does not preclude SARS-Cov-2 infection and should not be used as the sole basis for treatment or other patient management decisions. A negative result may occur with  improper specimen collection/handling, submission of specimen other than nasopharyngeal swab, presence of viral mutation(s) within the areas targeted by this assay, and inadequate number of viral copies(<138 copies/mL). A negative result must be combined with clinical observations, patient history, and epidemiological information. The expected result is Negative.  Fact Sheet for Patients:  BloggerCourse.com  Fact Sheet for Healthcare Providers:  SeriousBroker.it  This test is no t yet approved or cleared by the [place]  FDA and  has been authorized for detection and/or diagnosis of SARS-CoV-2 by FDA under an Emergency Use Authorization (EUA). This EUA will remain  in effect (meaning this test can be used) for the duration of the COVID-19 declaration under Section 564(b)(1) of the Act, 21 U.S.C.section 360bbb-3(b)(1), unless the authorization is terminated  or revoked sooner.       Influenza A by PCR NEGATIVE NEGATIVE Final   Influenza B by PCR NEGATIVE NEGATIVE Final    Comment: (NOTE) The Xpert Xpress SARS-CoV-2/FLU/RSV plus assay is intended as an aid in the diagnosis of influenza from Nasopharyngeal swab specimens and should not be used as a sole basis for treatment. Nasal washings and aspirates are unacceptable for Xpert Xpress SARS-CoV-2/FLU/RSV testing.  Fact Sheet for Patients: BloggerCourse.com  Fact Sheet for Healthcare Providers: SeriousBroker.it  This test is not yet approved or cleared by the [place]  FDA and has been authorized for detection  and/or diagnosis of SARS-CoV-2 by FDA under an Emergency Use Authorization (EUA). This EUA will remain in effect (meaning this test can be used) for the duration of the COVID-19 declaration under Section 564(b)(1) of the Act, 21 U.S.C. section 360bbb-3(b)(1), unless the authorization is terminated or revoked.  Performed at [facility], [address] [place], [place] [address]   MRSA PCR Screening     Status: None   Collection Time: 03/22/20  6:18 AM   Specimen: Nasopharyngeal  Result Value Ref Range Status   MRSA by PCR NEGATIVE NEGATIVE Final    Comment:        The GeneXpert  MRSA Assay (FDA approved for NASAL specimens only), is one component of a comprehensive MRSA colonization surveillance program. It is not intended to diagnose MRSA infection nor to guide or monitor treatment for MRSA infections. Performed at [facility], [address] [place], [place] [address]   Urine Culture     Status: Abnormal   Collection Time: 03/23/20 11:44 PM   Specimen: Urine, Clean Catch  Result Value Ref Range Status   Specimen Description   Final    URINE, CLEAN CATCH Performed at [facility], [address] [place], [place] [address]    Special Requests   Final    NONE Performed at [facility], [address] [place], [place] [address]    Culture (A)  Final    10,000 COLONIES/mL GROUP B STREP(S.AGALACTIAE)ISOLATED TESTING AGAINST S. AGALACTIAE NOT ROUTINELY PERFORMED DUE TO PREDICTABILITY OF AMP/PEN/VAN SUSCEPTIBILITY. Performed at [facility], [address] [place], [place] [address]    Report Status 03/25/2020 FINAL  Final    Coagulation Studies: No results for input(s): LABPROT, INR in the last 72 hours.  Urinalysis: No results for input(s): COLORURINE, LABSPEC, PHURINE, GLUCOSEU, HGBUR, BILIRUBINUR, KETONESUR, PROTEINUR, UROBILINOGEN, NITRITE, LEUKOCYTESUR in the last 72 hours.  Invalid  input(s): APPERANCEUR    Imaging: No results found.   Medications:     amLODipine   10 mg Oral Daily   Chlorhexidine  Gluconate Cloth  6 each Topical Daily   cloNIDine   0.1 mg Oral BID   furosemide   40 mg Oral Daily   insulin  aspart  0-5 Units Subcutaneous QHS   insulin  aspart  0-6 Units Subcutaneous TID WC   iron  polysaccharides  150 mg Oral Daily   isosorbide  mononitrate  30 mg Oral Daily   labetalol   200 mg Oral BID   pantoprazole   40 mg Oral Daily   patiromer   16.8 g Oral Daily   sodium bicarbonate   1,300 mg Oral TID   acetaminophen  **OR** acetaminophen , albuterol , artificial tears, HYDROmorphone  (DILAUDID ) injection, ondansetron  **OR** ondansetron  (ZOFRAN ) IV, oxybutynin , oxyCODONE   Assessment/ Plan:  Mr. Trevor Meyer is a 58 y.o.  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  Patient presents to the hospital for scheduled surgical procedure for PD catheter placement and was found to have an elevated potassium level.  Patient now admitted for Right inguinal hernia [K40.90] Umbilical hernia without obstruction and without gangrene [K42.9] End stage renal disease (HCC) [N18.6] Hyperkalemia [E87.5]     Hyperkalemia with end-stage renal disease.  Potassium 4.5, PD catheter placed today 08/12/2023.  Dialysis navigator notified of patient and will initiate urgent start at [facility].  Will request urgent start as outpatient. Patient on daily Velt[place] .   2. Anemia of chronic kidney disease Recent Labs       Lab Results  Component Value Date    HGB 8.9 (L) 08/12/2023    Monitoring hemoglobin as patient will likely require ESA outpatient.   3. Secondary Hyperparathyroidism: with outpatient labs: PTH 89, phosphorus 5.9, calcium  10.7 on 06/29/23.    Recent Labs       Lab Results  Component Value Date    PTH 28 06/03/2023    CALCIUM  8.6 (L) 08/12/2023     CAION 1.12 (L) 08/10/2023    PHOS 6.9 (H) 08/12/2023       Will continue to monitor bone minerals.  4. Hypocalcemia Ca 8.6 today Calcium  low, and phosphorous  elevated. Will start Calcium  Acetate 2 tabs with meals.  5.  Hypertension with chronic kidney disease.  Currently receiving amlodipine , clonidine , furosemide , isosorbide , and labetalol . BP 182/94   LOS: 1 Dillie Burandt P Carrianne Hyun 8/2/20251:28 PM

## 2023-08-12 NOTE — Progress Notes (Signed)
 Discharge instructions and med details reviewed with patient. All questions answered.  (Per patient) Patient's wife already picked pts prescriptions from pharmacy. Patient verbalizes understanding about making follow up appointments on Monday.  Wife is here to pick up patient. IV removed.  Per MD Singht, Dialysis center will call patient to set up a time for PD. Pt made aware of this.   Coston Mandato G Patino Hernandez, RN

## 2023-08-12 NOTE — Discharge Summary (Addendum)
 Physician Discharge Summary   Patient: Trevor Meyer MRN: [id number] DOB: May 04, 1965  Admit date:     08/10/2023  Discharge date: 08/12/23  Discharge Physician: Amaryllis Dare   PCP: Manya Toribio SQUIBB, PA   Recommendations at discharge:  Please obtain CBC and renal function on follow-up Follow-up with nephrology on Monday to be started on peritoneal dialysis Follow-up with urology within next week for voiding trial-patient is being discharged with Foley catheter in place. Follow-up with general surgery-postsurgery follow-up Follow-up with primary care provider  Discharge Diagnoses: Principal Problem:   Hyperkalemia Active Problems:   Right inguinal hernia   Umbilical hernia without obstruction and without gangrene   Chronic kidney disease, stage V (HCC)   Aortic mural thrombus (HCC)   Hypertension   Iron  deficiency anemia   Hospital Course: Taken from H&P   Trevor Meyer is a 58 y.o. male with medical history significant of CKDIV with associated AOCD as well as IDA, Aortic mural thrombus, Hypertension,  Umbilical and inguinal hernia who presented to pre-op for elective umbilical and inguinal repair. Patient labs however were notable for hyperkalemia with k  of 5.6 and due to this procedure was canceled. Patient case was discussed with on call nephrology who recommended admission to hospitalist service for treatment of hyperkalemia.   8/1:Vital stable,K improved to 4.7, Hgb at 8.9 s/p 1 unit of PRBC, Going for hernia repair and PD catheter placement today.    8/2: Remained hemodynamically stable,  Patient developed hematuria during surgery yesterday and urology was consulted,  they initially recommend manual irrigation.  Hematuria now improved.  Urology is recommending continue with Foley catheter on discharge and they will follow-up as outpatient for voiding trial.  General surgery also cleared him for discharge after the hernia repair and they will follow-up as  outpatient.  Patient need to follow-up with nephrology on Monday so they can start him on peritoneal dialysis.  Patient did had some worsening of hyperkalemia to 5.7 this morning s/p another round of medical management which include insulin  with glucose, albuterol , calcium  gluconate and Lokelma , potassium on repeat was 4.5.  Patient was given daily Veltassa  to use until he starts dialysis and nephrology can stop as appropriate.  He was also started on bicarb tablet and PhosLo  to prevent hyperphosphatemia and metabolic acidosis secondary to advanced renal disease.  Patient need to continue on current medications and follow-up with his providers closely for further assistance.  His pharmacy did not had Veltassa  so he was given a new prescription of Lokelma   Assessment and Plan: * Hyperkalemia Improved with medical management, likely secondary to advanced renal disease. -Continue to monitor - Continue with daily Veltassa   Right inguinal hernia Umbilical hernia PD catheter placement. S/p hernia repair and PD catheter placement on 8//25  Chronic kidney disease, stage V (HCC) History of polycystic kidney disease. Now ESRD and going to be started on PD. PD was planned as outpatient but patient was missing some appointments to initiate.  PD catheter was placed on 08/11/2023 and nephrology would like to start PD early next week as outpatient.  Aortic mural thrombus (HCC) MRI on 08/02/2023 noted previously observed intramural hematoma involving the thoracic aorta I significantly improved. - Outpatient follow-up with vascular surgery  Hypertension - Continue home antihypertensives which include clonidine , amlodipine  and labetalol   Iron  deficiency anemia Received 1 unit of PRBC preoperatively. - Continue with iron  supplement - S/p 1 dose of Venofer  after the procedure as recommended by hematology-ordered for tomorrow   Consultants: Nephrology.  General surgery.  Urology Procedures  performed: Hernia repair.  PD catheter placement Disposition: Home Diet recommendation:  Discharge Diet Orders (From admission, onward)     Start     Ordered   08/12/23 0000  Diet - low sodium heart healthy        08/12/23 1437           Renal diet DISCHARGE MEDICATION: Allergies as of 08/12/2023       Reactions   Hydralazine  Other (See Comments)   Flushing and tachycardia        Medication List     STOP taking these medications    B-complex with vitamin C tablet   Fish Oil 1000 MG Caps   MAGNESIUM  PO   VITAMIN C PO   ZINC PO       TAKE these medications    acetaminophen  500 MG tablet Commonly known as: TYLENOL  Take 2 tablets (1,000 mg total) by mouth every 6 (six) hours as needed for mild pain (pain score 1-3).   amLODipine  10 MG tablet Commonly known as: NORVASC  Take 1 tablet (10 mg total) by mouth daily.   calcium  acetate 667 MG capsule Commonly known as: PHOSLO  Take 2 capsules (1,334 mg total) by mouth 3 (three) times daily with meals.   cloNIDine  0.1 MG tablet Commonly known as: CATAPRES  Take 1 tablet (0.1 mg total) by mouth 2 (two) times daily.   furosemide  40 MG tablet Commonly known as: Lasix  Take 1 tablet (40 mg total) by mouth daily.   iron  polysaccharides 150 MG capsule Commonly known as: NIFEREX Take 1 capsule (150 mg total) by mouth daily.   isosorbide  mononitrate 30 MG 24 hr tablet Commonly known as: IMDUR  Take 1 tablet (30 mg total) by mouth daily.   labetalol  200 MG tablet Commonly known as: NORMODYNE  Take 1 tablet (200 mg total) by mouth 2 (two) times daily.   Lokelma  5 g packet Generic drug: sodium zirconium cyclosilicate  Take 10 g by mouth daily.   Lubricant Eye Drop 0.6 % Soln Generic drug: Propylene Glycol Place 1-2 drops into both eyes 3 (three) times daily as needed (dry/irritated eyes.).   ondansetron  4 MG tablet Commonly known as: ZOFRAN  Take 1 tablet (4 mg total) by mouth every 6 (six) hours as needed for  nausea.   oxybutynin  5 MG tablet Commonly known as: DITROPAN  Take 1 tablet (5 mg total) by mouth every 8 (eight) hours as needed for bladder spasms.   oxyCODONE  5 MG immediate release tablet Commonly known as: Roxicodone  Take 1 tablet (5 mg total) by mouth every 4 (four) hours as needed for severe pain (pain score 7-10).   pantoprazole  40 MG tablet Commonly known as: PROTONIX  Take 1 tablet (40 mg total) by mouth daily.   patiromer  8.4 g packet Commonly known as: VELTASSA  Take 2 packets (16.8 g total) by mouth daily. Start taking on: August 13, 2023   sodium bicarbonate  650 MG tablet Take 2 tablets (1,300 mg total) by mouth 3 (three) times daily. What changed: how much to take               Discharge Care Instructions  (From admission, onward)           Start     Ordered   08/12/23 0000  Leave dressing on - Keep it clean, dry, and intact until clinic visit        08/12/23 1437            Follow-up Information  Desiderio Schanz, MD Follow up on 08/25/2023.   Specialty: General Surgery Why: Appointment scheduled for 08/25/23 at 9:30 AM.  Please arrive 15 min early. Contact information: [address] [place] [address] [phone]         Manya Toribio SQUIBB, PA. Schedule an appointment as soon as possible for a visit in 1 week(s).   Specialty: Physician Assistant Contact information: [address] [place] [address] [phone]         Dennise Capri, MD Follow up.   Specialty: Nephrology Contact information: [address] [place] [address] [phone]         Francisca Redell BROCKS, MD. Schedule an appointment as soon as possible for a visit in 3 day(s).   Specialty: Urology Contact information: [address] [place] [address] [phone]                Discharge Exam: There were no vitals filed for this visit. General.  Well-developed gentleman, in no acute  distress. Pulmonary.  Lungs clear bilaterally, normal respiratory effort. CV.  Regular rate and rhythm, no JVD, rub or murmur. Abdomen.  Soft, nontender, nondistended, BS positive. CNS.  Alert and oriented .  No focal neurologic deficit. Extremities.  No edema, no cyanosis, pulses intact and symmetrical. Psychiatry.  Judgment and insight appears normal.   Condition at discharge: stable  The results of significant diagnostics from this hospitalization (including imaging, microbiology, ancillary and laboratory) are listed below for reference.   Imaging Studies: DG Chest 1 View Result Date: 08/10/2023 EXAM: 1 VIEW XRAY OF THE CHEST 08/10/2023 12:54:10 PM COMPARISON: 03/25/2020 radiograph and CT 03/22/2020. CLINICAL HISTORY: End stage chronic kidney disease (HCC). FINDINGS: LUNGS AND PLEURA: There is a 1 cm ill-defined opacity laterally in the right mid lung field. No pulmonary edema. No pleural effusion. No pneumothorax. HEART AND MEDIASTINUM: No acute abnormality of the cardiac and mediastinal silhouettes. BONES AND SOFT TISSUES: No acute osseous abnormality. Contrast material in the region of the gastric fundus. IMPRESSION: 1. 1 cm ill-defined opacity laterally in the right mid lung field. Electronically signed by: Katheleen Faes MD 08/10/2023 02:02 PM EDT RP Workstation: [id number]   MR ANGIO CHEST WO CONTRAST Result Date: 08/07/2023 CLINICAL DATA:  Thoracic aortic disease EXAM: MRA CHEST WITH OR WITHOUT CONTRAST TECHNIQUE: Angiographic images of the chest were obtained using MRA technique without intravenous contrast. CONTRAST:  None. COMPARISON:  CT angiogram of the chest performed March 21, 2020 FINDINGS: VASCULAR Aorta: Using axial imaging, the tubular ascending thoracic aorta is estimated at 4.1 x 4.1 cm. The aortic arch is estimated at 3.8 by 4.2 cm. The proximal descending thoracic aorta is estimated at 2.8 cm. The distal descending thoracic aorta is estimated at 2.4 cm. Previously observed  intramural hematoma involving the thoracic aorta is significantly improved. There is very minimal wall thickening of the left lateral wall of the descending thoracic aorta within estimated thickness of 4 mm at that site, previously measuring in excess of 7 mm. Heart: Enlarged heart, no pericardial effusion. Pulmonary Arteries:  Within normal limits. Other: Polycystic kidneys.  Small liver cysts. NON-VASCULAR Spinal cord: Nothing significant. Brachial plexus: Nothing significant. Muscles and tendons: Nothing significant. Bones: Nothing significant. Joints: Nothing significant. IMPRESSION: 1. Evolution of previously observed aortic findings. The tubular ascending thoracic aorta is estimated at 4.1 cm in diameter. The aortic arch is estimated at 4.2 cm. The proximal descending thoracic aorta returns to a normal caliber. There is some residual wall  thickening in the mid descending thoracic aorta estimated at 4 mm in greatest thickness (previously 7 mm). Recommend semi-annual imaging followup by CTA or MRA and referral to cardiothoracic surgery if not already obtained. This recommendation follows 2010 ACCF/AHA/AATS/ACR/ASA/SCA/SCAI/SIR/STS/SVM Guidelines for the Diagnosis and Management of Patients With Thoracic Aortic Disease. Circulation. 2010; 121: Z733-z63. Aortic aneurysm NOS (ICD10-I71.9) Electronically Signed   By: Maude Naegeli M.D.   On: 08/07/2023 08:27    Microbiology: Results for orders placed or performed during the hospital encounter of 03/21/20  Resp Panel by RT-PCR (Flu A&B, Covid) Nasopharyngeal Swab     Status: None   Collection Time: 03/21/20 11:32 PM   Specimen: Nasopharyngeal Swab; Nasopharyngeal(NP) swabs in vial transport medium  Result Value Ref Range Status   SARS Coronavirus 2 by RT PCR NEGATIVE NEGATIVE Final    Comment: (NOTE) SARS-CoV-2 target nucleic acids are NOT DETECTED.  The SARS-CoV-2 RNA is generally detectable in upper respiratory specimens during the acute phase of  infection. The lowest concentration of SARS-CoV-2 viral copies this assay can detect is 138 copies/mL. A negative result does not preclude SARS-Cov-2 infection and should not be used as the sole basis for treatment or other patient management decisions. A negative result may occur with  improper specimen collection/handling, submission of specimen other than nasopharyngeal swab, presence of viral mutation(s) within the areas targeted by this assay, and inadequate number of viral copies(<138 copies/mL). A negative result must be combined with clinical observations, patient history, and epidemiological information. The expected result is Negative.  Fact Sheet for Patients:  BloggerCourse.com  Fact Sheet for Healthcare Providers:  SeriousBroker.it  This test is no t yet approved or cleared by the [place]  FDA and  has been authorized for detection and/or diagnosis of SARS-CoV-2 by FDA under an Emergency Use Authorization (EUA). This EUA will remain  in effect (meaning this test can be used) for the duration of the COVID-19 declaration under Section 564(b)(1) of the Act, 21 U.S.C.section 360bbb-3(b)(1), unless the authorization is terminated  or revoked sooner.       Influenza A by PCR NEGATIVE NEGATIVE Final   Influenza B by PCR NEGATIVE NEGATIVE Final    Comment: (NOTE) The Xpert Xpress SARS-CoV-2/FLU/RSV plus assay is intended as an aid in the diagnosis of influenza from Nasopharyngeal swab specimens and should not be used as a sole basis for treatment. Nasal washings and aspirates are unacceptable for Xpert Xpress SARS-CoV-2/FLU/RSV testing.  Fact Sheet for Patients: BloggerCourse.com  Fact Sheet for Healthcare Providers: SeriousBroker.it  This test is not yet approved or cleared by the [place]  FDA and has been authorized for detection and/or diagnosis of SARS-CoV-2  by FDA under an Emergency Use Authorization (EUA). This EUA will remain in effect (meaning this test can be used) for the duration of the COVID-19 declaration under Section 564(b)(1) of the Act, 21 U.S.C. section 360bbb-3(b)(1), unless the authorization is terminated or revoked.  Performed at [facility], [address] [place], [place] [address]   MRSA PCR Screening     Status: None   Collection Time: 03/22/20  6:18 AM   Specimen: Nasopharyngeal  Result Value Ref Range Status   MRSA by PCR NEGATIVE NEGATIVE Final    Comment:        The GeneXpert MRSA Assay (FDA approved for NASAL specimens only), is one component of a comprehensive MRSA colonization surveillance program. It is not intended to diagnose MRSA infection nor to guide or monitor treatment for MRSA infections. Performed at  [facility], [address] [place], [place] [address]   Urine Culture     Status: Abnormal   Collection Time: 03/23/20 11:44 PM   Specimen: Urine, Clean Catch  Result Value Ref Range Status   Specimen Description   Final    URINE, CLEAN CATCH Performed at [facility], [address], [place], [place] [address]    Special Requests   Final    NONE Performed at [facility], [address] [place], [place] [address]    Culture (A)  Final    10,000 COLONIES/mL GROUP B STREP(S.AGALACTIAE)ISOLATED TESTING AGAINST S. AGALACTIAE NOT ROUTINELY PERFORMED DUE TO PREDICTABILITY OF AMP/PEN/VAN SUSCEPTIBILITY. Performed at [facility], [address], [place], [place] [address]    Report Status 03/25/2020 FINAL  Final    Labs: CBC: Recent Labs  Lab 08/09/23 1548 08/10/23 0652 08/10/23 0704 08/10/23 1246 08/11/23 0257 08/12/23 0330  WBC 4.2  --   --   --  4.9 6.5  NEUTROABS 2.9  --   --   --   --   --   HGB 7.7* 7.5* 7.5* 9.0* 8.9* 8.9*  HCT 24.1* 22.0* 22.0* 28.3* 27.1* 27.2*  MCV 91.3  --   --   --  87.4 89.8  PLT 157  --   --   --   140* 135*   Basic Metabolic Panel: Recent Labs  Lab 08/10/23 0652 08/10/23 0704 08/10/23 0708 08/10/23 1935 08/11/23 0257 08/12/23 0330 08/12/23 0826 08/12/23 1238  NA 143 143  --   --  142 140  --   --   K 5.5* 5.5*   < > 5.7* 4.7 5.7* 5.2* 4.5  CL 112* 113*  --   --  112* 110  --   --   CO2  --   --   --   --  19* 17*  --   --   GLUCOSE 102* 102*  --   --  98 132*  --   --   BUN 93* 94*  --   --  84* 84*  --   --   CREATININE 9.80* 9.70*  --   --  8.82* 8.90*  --   --   CALCIUM   --   --   --   --  8.5* 8.6*  --   --   PHOS  --   --   --   --   --  6.9*  --   --    < > = values in this interval not displayed.   Liver Function Tests: Recent Labs  Lab 08/11/23 0257 08/12/23 0330  AST 10*  --   ALT 9  --   ALKPHOS 36*  --   BILITOT 0.6  --   PROT 7.1  --   ALBUMIN 3.5 3.6   CBG: Recent Labs  Lab 08/11/23 0912 08/11/23 2026 08/12/23 0726 08/12/23 1021 08/12/23 1129  GLUCAP 83 114* 93 169* 102*    Discharge time spent: greater than 30 minutes.  This record has been created using Conservation officer, historic buildings. Errors have been sought and corrected,but may not always be located. Such creation errors do not reflect on the standard of care.   Signed: Amaryllis Dare, MD [facility] 08/12/2023

## 2023-08-14 ENCOUNTER — Encounter: Payer: Self-pay | Admitting: Surgery

## 2023-08-14 ENCOUNTER — Other Ambulatory Visit: Payer: Self-pay

## 2023-08-14 ENCOUNTER — Inpatient Hospital Stay: Attending: Oncology

## 2023-08-14 ENCOUNTER — Emergency Department
Admission: EM | Admit: 2023-08-14 | Discharge: 2023-08-14 | Disposition: A | Discharge status: Home / Self Care | Attending: Emergency Medicine | Admitting: Emergency Medicine

## 2023-08-14 ENCOUNTER — Telehealth: Payer: Self-pay

## 2023-08-14 DIAGNOSIS — N186 End stage renal disease: Secondary | ICD-10-CM | POA: Insufficient documentation

## 2023-08-14 DIAGNOSIS — Z466 Encounter for fitting and adjustment of urinary device: Secondary | ICD-10-CM | POA: Diagnosis present

## 2023-08-14 DIAGNOSIS — D649 Anemia, unspecified: Secondary | ICD-10-CM | POA: Insufficient documentation

## 2023-08-14 NOTE — ED Provider Notes (Signed)
 [facility] Provider Note    Event Date/Time   First MD Initiated Contact with Patient 08/14/23 1121     (approximate)   History   Foley Catheter Removal   HPI  Trevor Meyer is a 58 y.o. male presents emergency department requesting Foley catheter removal.  States placed a week ago.  Foley last placed due to end-stage renal disease.  States he was unable to get an appointment with his doctor to have it removed.  No dysuria no pain at the site of insertion.      Physical Exam   Triage Vital Signs: ED Triage Vitals [08/14/23 1107]  Encounter Vitals Group     BP (!) 170/94     Girls Systolic BP Percentile      Girls Diastolic BP Percentile      Boys Systolic BP Percentile      Boys Diastolic BP Percentile      Pulse Rate (!) 103     Resp 18     Temp 98.6 F (37 C)     Temp Source Oral     SpO2 97 %     Weight 140 lb (63.5 kg)     Height 5' 6 (1.676 m)     Head Circumference      Peak Flow      Pain Score 5     Pain Loc      Pain Education      Exclude from Growth Chart     Most recent vital signs: Vitals:   08/14/23 1107  BP: (!) 170/94  Pulse: (!) 103  Resp: 18  Temp: 98.6 F (37 C)  SpO2: 97%     General: Awake, no distress.   CV:  Good peripheral perfusion.  Resp:  Normal effort.  Abd:  No distention.   Other:     ED Results / Procedures / Treatments   Labs (all labs ordered are listed, but only abnormal results are displayed) Labs Reviewed - No data to display   EKG     RADIOLOGY     PROCEDURES:   Procedures  Critical Care:  no Chief Complaint  Patient presents with   Foley Catheter Removal      MEDICATIONS ORDERED IN ED: Medications - No data to display   IMPRESSION / MDM / ASSESSMENT AND PLAN / ED COURSE  I reviewed the triage vital signs and the nursing notes.                              Differential diagnosis includes, but is not limited to, Foley catheter care, urinary retention,  Foley catheter removal  Patient's presentation is most consistent with acute, uncomplicated illness.   I did explain to the patient I would rather his doctor remove this.  However he is insistent on having the catheter removed.  Will go ahead and remove it.  Will wait and make sure he can urinate on his own here in the ED.  Follow-up with his doctor.  If starts to have urinary retention he will need to return emergency department.  He is in agreement with this treatment plan.  Discharged stable condition.      FINAL CLINICAL IMPRESSION(S) / ED DIAGNOSES   Final diagnoses:  Encounter for Foley catheter removal     Rx / DC Orders   ED Discharge Orders     None  Note:  This document was prepared using Dragon voice recognition software and may include unintentional dictation errors.    Gasper Devere ORN, PA-C 08/14/23 1237    Levander Slate, MD 08/14/23 [id number]

## 2023-08-14 NOTE — Progress Notes (Signed)
 [facility] referral submitted for Urgent PD.  All information faxed over to [facility].  Attempted to contact patient received no answer left VM to call back.   Suzen Satchel Dialysis Navigator

## 2023-08-14 NOTE — Transitions of Care (Post Inpatient/ED Visit) (Signed)
 08/14/2023  Name: Trevor Meyer MRN: [id number] DOB: Nov 18, 1965  Today's TOC FU Call Status:   Patient's Name and Date of Birth confirmed.  Transition Care Management Follow-up Telephone Call Date of Discharge: 08/12/23 Discharge Facility: [facility]) (End stage renal disease) Type of Discharge:  (Observation) How have you been since you were released from the hospital?: Same Any questions or concerns?: Yes Patient Questions/Concerns:: needs appt for removal of cather, told pt he needs to go back to hospital for removal Patient Questions/Concerns Addressed: Provided Patient Educational Materials  Items Reviewed: Did you receive and understand the discharge instructions provided?: Yes Medications obtained,verified, and reconciled?: Yes (Medications Reviewed) Any new allergies since your discharge?: No Dietary orders reviewed?: NA Do you have support at home?: Yes  Medications Reviewed Today: Medications Reviewed Today     Reviewed by Clois Montavon, Steva SAILOR, CMA (Certified Medical Assistant) on 08/14/23 at [phone]  Med List Status: <None>   Medication Order Taking? Sig Documenting Provider Last Dose Status Informant  acetaminophen  (TYLENOL ) 500 MG tablet [id number]  Take 2 tablets (1,000 mg total) by mouth every 6 (six) hours as needed for mild pain (pain score 1-3). Desiderio Schanz, MD  Active   amLODipine  (NORVASC ) 10 MG tablet [id number] No Take 1 tablet (10 mg total) by mouth daily. Maree Hue, MD 08/09/2023 Active Pharmacy Records, Other  calcium  acetate (PHOSLO ) 667 MG capsule [id number]  Take 2 capsules (1,334 mg total) by mouth 3 (three) times daily with meals. Amin, Sumayya, MD  Active   cloNIDine  (CATAPRES ) 0.1 MG tablet [id number] No Take 1 tablet (0.1 mg total) by mouth 2 (two) times daily. Maree Hue, MD 08/10/2023 Morning Active Self  furosemide  (LASIX ) 40 MG tablet [id number] No Take 1 tablet (40 mg total) by mouth daily. Von Bellis, MD 08/09/2023 Active  Self  iron  polysaccharides (NIFEREX) 150 MG capsule [id number] No Take 1 capsule (150 mg total) by mouth daily. Von Bellis, MD 08/09/2023 Active Self  isosorbide  mononitrate (IMDUR ) 30 MG 24 hr tablet [id number] No Take 1 tablet (30 mg total) by mouth daily. Maree Hue, MD 08/09/2023 Active Self  labetalol  (NORMODYNE ) 200 MG tablet [id number] No Take 1 tablet (200 mg total) by mouth 2 (two) times daily. Maree Hue, MD 08/09/2023 Active Self  ondansetron  (ZOFRAN ) 4 MG tablet [id number]  Take 1 tablet (4 mg total) by mouth every 6 (six) hours as needed for nausea. Caleen Qualia, MD  Active   oxybutynin  (DITROPAN ) 5 MG tablet [id number]  Take 1 tablet (5 mg total) by mouth every 8 (eight) hours as needed for bladder spasms. Caleen Qualia, MD  Active   oxyCODONE  (ROXICODONE ) 5 MG immediate release tablet [id number]  Take 1 tablet (5 mg total) by mouth every 4 (four) hours as needed for severe pain (pain score 7-10). Desiderio Schanz, MD  Active   pantoprazole  (PROTONIX ) 40 MG tablet [id number] No Take 1 tablet (40 mg total) by mouth daily. Von Bellis, MD 08/09/2023 Active Self  patiromer  (VELTASSA ) 8.4 g packet [id number]  Take 2 packets (16.8 g total) by mouth daily. Caleen Qualia, MD  Active   Propylene Glycol (LUBRICANT EYE DROP) 0.6 % SOLN [id number]  Place 1-2 drops into both eyes 3 (three) times daily as needed (dry/irritated eyes.). [provider]  Active Self  sodium bicarbonate  650 MG tablet [id number]  Take 2 tablets (1,300 mg total) by mouth 3 (three) times daily. Caleen Qualia, MD  Active   sodium zirconium cyclosilicate  (LOKELMA ) 5 g packet [id number]  Take 10 g by mouth daily. Caleen Qualia, MD  Active             Home Care and Equipment/Supplies: Were Home Health Services Ordered?: NA Any new equipment or medical supplies ordered?: NA (catheter)  Functional Questionnaire: Do you need assistance with bathing/showering or dressing?: No Do you need assistance with meal preparation?:  No Do you need assistance with eating?: No Do you have difficulty maintaining continence: No Do you need assistance with getting out of bed/getting out of a chair/moving?: No Do you have difficulty managing or taking your medications?: No  Follow up appointments reviewed: PCP Follow-up appointment confirmed?: NA (pt is on dialysis) Specialist Hospital Follow-up appointment confirmed?: NA Do you need transportation to your follow-up appointment?: No Do you understand care options if your condition(s) worsen?: Yes-patient verbalized understanding    SIGNATURE Steva Veronnica Hennings, CMA

## 2023-08-14 NOTE — Discharge Instructions (Signed)
 Follow-up with your doctor.  Return to emergency department if you are unable to urinate.

## 2023-08-14 NOTE — ED Notes (Signed)
 Pt able to ambulate to bathroom to urinate independently

## 2023-08-14 NOTE — ED Triage Notes (Signed)
 Patient to ED via POV for foley catheter removal. PT states it is time for it to be removed but the office doesn't have an appointment until the 26th. States it was placed on 8/1- has not seen urology yet.

## 2023-08-15 ENCOUNTER — Telehealth: Payer: Self-pay

## 2023-08-15 NOTE — Progress Notes (Signed)
 Received confirmation from [facility] of acceptance with [place] for PD.  [facility] confirmed PD nurse is in comunication with pt on 08/15/2023 @ 10:03am.  Suzen Satchel Dialysis Navigator [email or web address]

## 2023-08-15 NOTE — Anesthesia Postprocedure Evaluation (Signed)
 Anesthesia Post Note  Patient: Trevor Meyer  Procedure(s) Performed: HERNIORRHAPHY, INGUINAL, ROBOT-ASSISTED, LAPAROSCOPIC (Right) REPAIR, HERNIA, UMBILICAL, ADULT LAPAROSCOPIC INSERTION CONTINUOUS AMBULATORY PERITONEAL DIALYSIS  (CAPD) CATHETER  Patient location during evaluation: PACU Anesthesia Type: General Level of consciousness: awake and alert Pain management: pain level controlled Vital Signs Assessment: post-procedure vital signs reviewed and stable Respiratory status: spontaneous breathing, nonlabored ventilation, respiratory function stable and patient connected to nasal cannula oxygen Cardiovascular status: blood pressure returned to baseline and stable Postop Assessment: no apparent nausea or vomiting Anesthetic complications: no   No notable events documented.   Last Vitals:  Vitals:   08/12/23 1300 08/12/23 1721  BP:  (!) 144/85  Pulse:  82  Resp:  16  Temp: 37.1 C 37.3 C  SpO2:  98%    Last Pain:  Vitals:   08/12/23 1821  TempSrc:   PainSc: 5                  Lynwood KANDICE Clause

## 2023-08-15 NOTE — Transitions of Care (Post Inpatient/ED Visit) (Signed)
 08/15/2023  Name: Trevor Meyer MRN: [id number] DOB: 06/14/1965  Today's TOC FU Call Status: Today's TOC FU Call Status:: Successful TOC FU Call Completed TOC FU Call Complete Date: 08/15/23 Patient's Name and Date of Birth confirmed.  Transition Care Management Follow-up Telephone Call Date of Discharge: 08/14/23 Discharge Facility: [facility]) Type of Discharge: Emergency Department How have you been since you were released from the hospital?: Better Any questions or concerns?: No  Items Reviewed: Did you receive and understand the discharge instructions provided?: Yes Medications obtained,verified, and reconciled?: Yes (Medications Reviewed) Any new allergies since your discharge?: No Dietary orders reviewed?: NA Do you have support at home?: Yes  Medications Reviewed Today: Medications Reviewed Today     Reviewed by Dredyn Gubbels, Steva SAILOR, CMA (Certified Medical Assistant) on 08/15/23 at [phone]  Med List Status: <None>   Medication Order Taking? Sig Documenting Provider Last Dose Status Informant  acetaminophen  (TYLENOL ) 500 MG tablet [id number]  Take 2 tablets (1,000 mg total) by mouth every 6 (six) hours as needed for mild pain (pain score 1-3). Desiderio Schanz, MD  Active   amLODipine  (NORVASC ) 10 MG tablet [id number] No Take 1 tablet (10 mg total) by mouth daily. Maree Hue, MD 08/09/2023 Active Pharmacy Records, Other  calcium  acetate (PHOSLO ) 667 MG capsule [id number]  Take 2 capsules (1,334 mg total) by mouth 3 (three) times daily with meals. Caleen Qualia, MD  Active   cloNIDine  (CATAPRES ) 0.1 MG tablet [id number] No Take 1 tablet (0.1 mg total) by mouth 2 (two) times daily. Maree Hue, MD 08/10/2023 Morning Active Self  furosemide  (LASIX ) 40 MG tablet [id number] No Take 1 tablet (40 mg total) by mouth daily. Von Bellis, MD 08/09/2023 Active Self  iron  polysaccharides (NIFEREX) 150 MG capsule [id number] No Take 1 capsule (150 mg total) by mouth daily.  Von Bellis, MD 08/09/2023 Active Self  isosorbide  mononitrate (IMDUR ) 30 MG 24 hr tablet [id number] No Take 1 tablet (30 mg total) by mouth daily. Maree Hue, MD 08/09/2023 Active Self  labetalol  (NORMODYNE ) 200 MG tablet [id number] No Take 1 tablet (200 mg total) by mouth 2 (two) times daily. Maree Hue, MD 08/09/2023 Active Self  ondansetron  (ZOFRAN ) 4 MG tablet [id number]  Take 1 tablet (4 mg total) by mouth every 6 (six) hours as needed for nausea. Amin, Sumayya, MD  Active   oxybutynin  (DITROPAN ) 5 MG tablet [id number]  Take 1 tablet (5 mg total) by mouth every 8 (eight) hours as needed for bladder spasms. Caleen Qualia, MD  Active   oxyCODONE  (ROXICODONE ) 5 MG immediate release tablet [id number]  Take 1 tablet (5 mg total) by mouth every 4 (four) hours as needed for severe pain (pain score 7-10). Desiderio Schanz, MD  Active   pantoprazole  (PROTONIX ) 40 MG tablet [id number] No Take 1 tablet (40 mg total) by mouth daily. Von Bellis, MD 08/09/2023 Active Self  patiromer  (VELTASSA ) 8.4 g packet [id number]  Take 2 packets (16.8 g total) by mouth daily. Caleen Qualia, MD  Active   Propylene Glycol (LUBRICANT EYE DROP) 0.6 % SOLN [id number]  Place 1-2 drops into both eyes 3 (three) times daily as needed (dry/irritated eyes.). [provider]  Active Self  sodium bicarbonate  650 MG tablet [id number]  Take 2 tablets (1,300 mg total) by mouth 3 (three) times daily. Caleen Qualia, MD  Active   sodium zirconium cyclosilicate  (LOKELMA ) 5 g packet [id number]  Take 10 g by mouth daily. Caleen Qualia, MD  Active  Home Care and Equipment/Supplies: Were Home Health Services Ordered?: NA Any new equipment or medical supplies ordered?: NA  Functional Questionnaire: Do you need assistance with bathing/showering or dressing?: No Do you need assistance with meal preparation?: No Do you need assistance with eating?: No Do you have difficulty maintaining continence: No Do you need assistance with  getting out of bed/getting out of a chair/moving?: No Do you have difficulty managing or taking your medications?: No  Follow up appointments reviewed: PCP Follow-up appointment confirmed?: Yes Specialist Hospital Follow-up appointment confirmed?: NA Do you need transportation to your follow-up appointment?: No Do you understand care options if your condition(s) worsen?: Yes-patient verbalized understanding    SIGNATUREKieandra Arnetia Bronk, CMA

## 2023-08-18 ENCOUNTER — Ambulatory Visit (INDEPENDENT_AMBULATORY_CARE_PROVIDER_SITE_OTHER): Admitting: Student

## 2023-08-18 ENCOUNTER — Encounter: Payer: Self-pay | Admitting: Student

## 2023-08-18 VITALS — BP 118/74 | HR 82 | Ht 66.0 in | Wt 143.0 lb

## 2023-08-18 DIAGNOSIS — N186 End stage renal disease: Secondary | ICD-10-CM | POA: Diagnosis not present

## 2023-08-18 DIAGNOSIS — I10 Essential (primary) hypertension: Secondary | ICD-10-CM | POA: Diagnosis not present

## 2023-08-18 DIAGNOSIS — R339 Retention of urine, unspecified: Secondary | ICD-10-CM | POA: Diagnosis not present

## 2023-08-18 DIAGNOSIS — E875 Hyperkalemia: Secondary | ICD-10-CM

## 2023-08-18 DIAGNOSIS — D509 Iron deficiency anemia, unspecified: Secondary | ICD-10-CM

## 2023-08-18 MED ORDER — FUROSEMIDE 40 MG PO TABS: 40.0000 mg | ORAL_TABLET | Freq: Every day | ORAL | 0 refills | Status: DC

## 2023-08-18 MED ORDER — PANTOPRAZOLE SODIUM 40 MG PO TBEC: 40.0000 mg | DELAYED_RELEASE_TABLET | Freq: Every day | ORAL | 2 refills | Status: AC

## 2023-08-18 NOTE — Assessment & Plan Note (Addendum)
 Next iron  infusion next week with hematology and continues to take oral iron  supplements.

## 2023-08-18 NOTE — Assessment & Plan Note (Addendum)
 He is having this followed at dialysis he declined blood work today.  Continue Lokelma  5 mg daily.

## 2023-08-18 NOTE — Assessment & Plan Note (Addendum)
 Started PD twice weekly on Tuesdays and Thursday and is training to transition to home PD.  Appears euvolemic.  He is currently taking Lokelma  5 g daily.  He did have labs done at dialysis on Tuesday but is unsure of results.  He will call nephrology regarding this.  Will continue Lokelma  5 mg daily for now.

## 2023-08-18 NOTE — Assessment & Plan Note (Signed)
 Foley placed on 8/1 after hernia repair and PD catheter placement due to concern for prostate injury.  He had Foley catheter removed on 8/4 and has been voiding without issues.  He has follow-up with urology later this month.

## 2023-08-18 NOTE — Progress Notes (Signed)
 Established Patient Office Visit  Subjective   Patient ID: Trevor Meyer, male    DOB: 1965-01-31  Age: 58 y.o. MRN: [id number]  Chief Complaint  Patient presents with   Hospitalization Follow-up   Patient presents today for hospital follow-up.  He was admitted from 08/10/2023 to 08/12/2023 for hyperkalemia prior to umbilical and inguinal hernia repair and PD catheter placement.  Has been doing well since discharge.  Did have Foley placed for concern for prostate injury after hernia/peritoneal dialysis catheter placement which was removed in the ED on 08/14/2023 due to patient discomfort.  He reports he is urinating well without feelings of retention.  He reported he did have some bleeding but this is drastically improved.  He is currently going to peritoneal dialysis at [facility] on Tuesdays and Thursdays and had blood drawn on Tuesday.  He is not sure what the results were.  Did have some constipation initially after surgery however now is having regular bowel movements.  Denies fevers, chills, abdominal swelling, urinary retention, constipation.  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) 06/27/2023   Hyperkalemia 06/27/2023   Hyperphosphatemia associated with renal failure 06/27/2023   Prediabetes 06/02/2023   Mixed hyperlipidemia 06/02/2023   ESRD (end stage renal disease) (HCC) 06/02/2023   Chronic kidney disease, stage V (HCC) 06/02/2023   Hypertension 06/01/2023   Autosomal dominant polycystic kidney disease 06/01/2023   Aortic atherosclerosis (HCC) 06/01/2023   Dissection of descending thoracic aorta (HCC) 06/01/2023   Shortness of breath    Chest pain    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  HENT:     Mouth/Throat:     Mouth: Mucous membranes are moist.     Pharynx: Oropharynx is clear.  Cardiovascular:     Rate and Rhythm: Normal rate and regular rhythm.  Pulmonary:     Effort: Pulmonary effort is normal.     Breath sounds: No rhonchi or rales.  Abdominal:     General: Abdomen is flat. Bowel sounds are normal. There is no distension.     Palpations: Abdomen is soft.     Comments: Surgical incisions are healing well without significant erythema or drainage.  PD site clean and well-dressed.  Healing ecchymosis from recent  Musculoskeletal:        General: Normal range of motion.     Right lower leg: No edema.     Left lower leg: No edema.  Skin:    General: Skin is warm and dry.     Capillary Refill: Capillary refill takes less than 2 seconds.  Neurological:     General: No focal deficit present.     Mental Status: He is alert and oriented to person, place, and time.  Psychiatric:        Mood and Affect: Mood normal.  Behavior: Behavior normal.        06/01/2023   10:31 AM  Depression screen PHQ 2/9  Decreased Interest 0  Down, Depressed, Hopeless 0  PHQ - 2 Score 0       06/01/2023   10:31 AM  GAD 7 : Generalized Anxiety Score  Nervous, Anxious, on Edge 0  Control/stop worrying 0  Worry too much - different things 0  Trouble relaxing 0  Restless 0  Easily annoyed or irritable 0  Afraid - awful might happen 0  Total GAD 7 Score 0  Anxiety Difficulty Not difficult at all    No results found for any visits on 08/18/23.  Last CBC Lab Results  Component Value Date   WBC 6.5 08/12/2023   HGB 8.9 (L) 08/12/2023   HCT 27.2 (L) 08/12/2023   MCV 89.8 08/12/2023    MCH 29.4 08/12/2023   RDW 14.4 08/12/2023   PLT 135 (L) 08/12/2023   Last metabolic panel Lab Results  Component Value Date   GLUCOSE 132 (H) 08/12/2023   NA 140 08/12/2023   K 4.6 08/12/2023   CL 110 08/12/2023   CO2 17 (L) 08/12/2023   BUN 84 (H) 08/12/2023   CREATININE 8.90 (H) 08/12/2023   GFRNONAA 6 (L) 08/12/2023   CALCIUM  8.6 (L) 08/12/2023   PHOS 6.9 (H) 08/12/2023   PROT 7.1 08/11/2023   ALBUMIN 3.6 08/12/2023   LABGLOB 3.0 08/09/2023   AGRATIO 1.5 06/03/2023   BILITOT 0.6 08/11/2023   ALKPHOS 36 (L) 08/11/2023   AST 10 (L) 08/11/2023   ALT 9 08/11/2023   ANIONGAP 13 08/12/2023      The 10-year ASCVD risk score (Arnett DK, et al., 2019) is: 10.2%    Assessment & Plan:  ESRD (end stage renal disease) (HCC) Assessment & Plan: Started PD twice weekly on Tuesdays and Thursday and is training to transition to home PD.  Appears euvolemic.  He is currently taking Lokelma  5 g daily.  He did have labs done at dialysis on Tuesday but is unsure of results.  He will call nephrology regarding this.  Will continue Lokelma  5 mg daily for now.   Iron  deficiency anemia, unspecified iron  deficiency anemia type Assessment & Plan: Next iron  infusion next week with hematology and continues to take oral iron  supplements.   Urinary retention Assessment & Plan: Foley placed on 8/1 after hernia repair and PD catheter placement due to concern for prostate injury.  He had Foley catheter removed on 8/4 and has been voiding without issues.  He has follow-up with urology later this month.   Primary hypertension Assessment & Plan: Normotensive today refill Lasix  40 mg daily.   Hyperkalemia Assessment & Plan: He is having this followed at dialysis he declined blood work today.  Continue Lokelma  5 mg daily.   Other orders -     Furosemide ; Take 1 tablet (40 mg total) by mouth daily.  Dispense: 30 tablet; Refill: 0 -     Pantoprazole  Sodium; Take 1 tablet (40 mg total) by mouth  daily.  Dispense: 30 tablet; Refill: 2     No follow-ups on file.    Harlene Saddler, MD

## 2023-08-18 NOTE — Assessment & Plan Note (Signed)
 Normotensive today refill Lasix  40 mg daily.

## 2023-08-21 ENCOUNTER — Inpatient Hospital Stay

## 2023-08-21 VITALS — BP 106/72 | HR 72 | Temp 97.9°F | Resp 16

## 2023-08-21 DIAGNOSIS — N185 Chronic kidney disease, stage 5: Secondary | ICD-10-CM

## 2023-08-21 DIAGNOSIS — D649 Anemia, unspecified: Secondary | ICD-10-CM | POA: Diagnosis present

## 2023-08-21 MED ORDER — IRON SUCROSE 20 MG/ML IV SOLN
200.0000 mg | Freq: Once | INTRAVENOUS | Status: AC
  Administered 2023-08-21 (×2): 200 mg via INTRAVENOUS
  Filled 2023-08-21: qty 10

## 2023-08-23 ENCOUNTER — Other Ambulatory Visit: Payer: Self-pay

## 2023-08-25 ENCOUNTER — Ambulatory Visit (INDEPENDENT_AMBULATORY_CARE_PROVIDER_SITE_OTHER): Admitting: Surgery

## 2023-08-25 ENCOUNTER — Encounter: Payer: Self-pay | Admitting: Surgery

## 2023-08-25 VITALS — BP 119/74 | HR 76 | Temp 98.8°F | Ht 66.0 in | Wt 146.6 lb

## 2023-08-25 DIAGNOSIS — Z992 Dependence on renal dialysis: Secondary | ICD-10-CM

## 2023-08-25 DIAGNOSIS — K4090 Unilateral inguinal hernia, without obstruction or gangrene, not specified as recurrent: Secondary | ICD-10-CM

## 2023-08-25 DIAGNOSIS — K429 Umbilical hernia without obstruction or gangrene: Secondary | ICD-10-CM

## 2023-08-25 DIAGNOSIS — N185 Chronic kidney disease, stage 5: Secondary | ICD-10-CM

## 2023-08-25 DIAGNOSIS — Z09 Encounter for follow-up examination after completed treatment for conditions other than malignant neoplasm: Secondary | ICD-10-CM

## 2023-08-25 DIAGNOSIS — N186 End stage renal disease: Secondary | ICD-10-CM

## 2023-08-25 NOTE — Progress Notes (Signed)
 08/25/2023  HPI: Lowen Barringer is a 58 y.o. male s/p robotic assisted right inguinal hernia repair, laparoscopic assisted peritoneal dialysis catheter placement, and open umbilical hernia repair on 08/11/2023.  Patient had hematuria postoperatively which required coud catheter placement in the operating room.  The hematuria resolved and the patient's catheter was removed in the emergency room on 08/14/23 due to discomfort.  The patient today presents for follow-up.  He reports that he feels an area of swelling and discomfort in the mid abdomen.  He also reports having a little bit of discomfort when he first starts voiding but after the initial discomfort, there is no further issues while he continues to void.  Denies seeing any further blood.  Vital signs: BP 119/74   Pulse 76   Temp 98.8 F (37.1 C) (Oral)   Ht 5' 6 (1.676 m)   Wt 146 lb 9.6 oz (66.5 kg)   SpO2 96%   BMI 23.66 kg/m    Physical Exam: Constitutional: No acute distress Abdomen: Soft, nondistended, with tenderness to palpation in the mid abdomen.  The patient incisions are healing well and are clean, dry, intact.  However there is some ecchymosis and swelling around the catheter insertion site and the umbilical hernia repair site.  However no evidence of hernia recurrence.  No evidence of hernia recurrence in the right groin either.  There is no evidence of infection  Assessment/Plan: This is a 58 y.o. male s/p robotic assisted right inguinal hernia repair, laparoscopic assisted peritoneal dialysis catheter placement, and open umbilical hernia repair.  - Discussed with the patient that the swelling and discomfort he is having is likely related to the bruising and inflammatory changes after surgery.  There is no evidence of infection at this point.  Discussed with patient that this will keep improving as inflammation and bruising resolved.  There is no evidence of any hernia recurrence on either umbilicus or right groin. - Patient  has follow-up appointment with urology on 09/05/2023.  Discussed with him to bring up the issues with discomfort with them.  However I do feel like this will keep improving as the tissue of the urethra lining heal better. - Follow-up as needed.  Return precautions given.   Aloysius Sheree Plant, MD [facility]

## 2023-08-25 NOTE — Patient Instructions (Signed)

## 2023-08-28 ENCOUNTER — Inpatient Hospital Stay

## 2023-08-28 ENCOUNTER — Ambulatory Visit: Admitting: Physician Assistant

## 2023-08-30 ENCOUNTER — Observation Stay
Admission: EM | Admit: 2023-08-30 | Discharge: 2023-09-01 | Disposition: A | Discharge status: Home / Self Care | Attending: Surgery | Admitting: Surgery

## 2023-08-30 ENCOUNTER — Encounter: Payer: Self-pay | Admitting: Emergency Medicine

## 2023-08-30 ENCOUNTER — Other Ambulatory Visit: Payer: Self-pay

## 2023-08-30 DIAGNOSIS — K635 Polyp of colon: Secondary | ICD-10-CM

## 2023-08-30 DIAGNOSIS — K625 Hemorrhage of anus and rectum: Secondary | ICD-10-CM | POA: Diagnosis present

## 2023-08-30 DIAGNOSIS — K648 Other hemorrhoids: Secondary | ICD-10-CM | POA: Diagnosis not present

## 2023-08-30 DIAGNOSIS — N186 End stage renal disease: Secondary | ICD-10-CM | POA: Insufficient documentation

## 2023-08-30 DIAGNOSIS — I120 Hypertensive chronic kidney disease with stage 5 chronic kidney disease or end stage renal disease: Secondary | ICD-10-CM | POA: Insufficient documentation

## 2023-08-30 DIAGNOSIS — K922 Gastrointestinal hemorrhage, unspecified: Secondary | ICD-10-CM | POA: Diagnosis not present

## 2023-08-30 DIAGNOSIS — D124 Benign neoplasm of descending colon: Secondary | ICD-10-CM | POA: Diagnosis not present

## 2023-08-30 DIAGNOSIS — Z79899 Other long term (current) drug therapy: Secondary | ICD-10-CM | POA: Insufficient documentation

## 2023-08-30 DIAGNOSIS — D62 Acute posthemorrhagic anemia: Secondary | ICD-10-CM | POA: Insufficient documentation

## 2023-08-30 LAB — COMPREHENSIVE METABOLIC PANEL WITH GFR
ALT: 15 U/L (ref 0–44)
AST: 22 U/L (ref 15–41)
Albumin: 3.4 g/dL — ABNORMAL LOW (ref 3.5–5.0)
Alkaline Phosphatase: 49 U/L (ref 38–126)
Anion gap: 12 (ref 5–15)
BUN: 86 mg/dL — ABNORMAL HIGH (ref 6–20)
CO2: 24 mmol/L (ref 22–32)
Calcium: 9.3 mg/dL (ref 8.9–10.3)
Chloride: 110 mmol/L (ref 98–111)
Creatinine, Ser: 10.05 mg/dL — ABNORMAL HIGH (ref 0.61–1.24)
GFR, Estimated: 5 mL/min — ABNORMAL LOW (ref >60)
Glucose, Bld: 101 mg/dL — ABNORMAL HIGH (ref 70–99)
Potassium: 4.1 mmol/L (ref 3.5–5.1)
Sodium: 146 mmol/L — ABNORMAL HIGH (ref 135–145)
Total Bilirubin: 0.4 mg/dL (ref 0.0–1.2)
Total Protein: 6.9 g/dL (ref 6.5–8.1)

## 2023-08-30 LAB — CBC WITH DIFFERENTIAL/PLATELET
Abs Immature Granulocytes: 0.01 K/uL (ref 0.00–0.07)
Basophils Absolute: 0 K/uL (ref 0.0–0.1)
Basophils Relative: 1 %
Eosinophils Absolute: 0.2 K/uL (ref 0.0–0.5)
Eosinophils Relative: 5 %
HCT: 25.1 % — ABNORMAL LOW (ref 39.0–52.0)
Hemoglobin: 7.9 g/dL — ABNORMAL LOW (ref 13.0–17.0)
Immature Granulocytes: 0 %
Lymphocytes Relative: 25 %
Lymphs Abs: 1.3 K/uL (ref 0.7–4.0)
MCH: 28.9 pg (ref 26.0–34.0)
MCHC: 31.5 g/dL (ref 30.0–36.0)
MCV: 91.9 fL (ref 80.0–100.0)
Monocytes Absolute: 0.4 K/uL (ref 0.1–1.0)
Monocytes Relative: 9 %
Neutro Abs: 3.1 K/uL (ref 1.7–7.7)
Neutrophils Relative %: 60 %
Platelets: 181 K/uL (ref 150–400)
RBC: 2.73 MIL/uL — ABNORMAL LOW (ref 4.22–5.81)
RDW: 13.5 % (ref 11.5–15.5)
WBC: 5.1 K/uL (ref 4.0–10.5)
nRBC: 0 % (ref 0.0–0.2)

## 2023-08-30 LAB — HEMOGLOBIN AND HEMATOCRIT, BLOOD
HCT: 22.5 % — ABNORMAL LOW (ref 39.0–52.0)
HCT: 25.9 % — ABNORMAL LOW (ref 39.0–52.0)
HCT: 25.6 % — ABNORMAL LOW (ref 39.0–52.0)
Hemoglobin: 7.2 g/dL — ABNORMAL LOW (ref 13.0–17.0)
Hemoglobin: 8.4 g/dL — ABNORMAL LOW (ref 13.0–17.0)
Hemoglobin: 8.5 g/dL — ABNORMAL LOW (ref 13.0–17.0)

## 2023-08-30 LAB — RETICULOCYTES
Immature Retic Fract: 11.5 % (ref 2.3–15.9)
RBC.: 2.72 MIL/uL — ABNORMAL LOW (ref 4.22–5.81)
Retic Count, Absolute: 47.1 K/uL (ref 19.0–186.0)
Retic Ct Pct: 1.7 % (ref 0.4–3.1)

## 2023-08-30 LAB — PROTIME-INR
INR: 1.1 (ref 0.8–1.2)
Prothrombin Time: 14.5 s (ref 11.4–15.2)

## 2023-08-30 LAB — APTT: aPTT: 30 s (ref 24–36)

## 2023-08-30 LAB — PREPARE RBC (CROSSMATCH)

## 2023-08-30 MED ORDER — SODIUM CHLORIDE 0.9% IV SOLUTION
Freq: Once | INTRAVENOUS | Status: DC
  Filled 2023-08-30: qty 250

## 2023-08-30 MED ORDER — ONDANSETRON HCL 4 MG PO TABS: 4.0000 mg | ORAL_TABLET | Freq: Four times a day (QID) | ORAL | Status: DC | PRN

## 2023-08-30 MED ORDER — PEG 3350-KCL-NA BICARB-NACL 420 G PO SOLR
4000.0000 mL | Freq: Once | ORAL | Status: AC
  Administered 2023-08-30: 4000 mL via ORAL
  Filled 2023-08-30: qty 4000

## 2023-08-30 MED ORDER — SODIUM ZIRCONIUM CYCLOSILICATE 10 G PO PACK: 10.0000 g | PACK | Freq: Every day | ORAL | Status: DC

## 2023-08-30 MED ORDER — ACETAMINOPHEN 325 MG PO TABS: 650.0000 mg | ORAL_TABLET | Freq: Four times a day (QID) | ORAL | Status: DC | PRN

## 2023-08-30 MED ORDER — PANTOPRAZOLE SODIUM 40 MG PO TBEC
40.0000 mg | DELAYED_RELEASE_TABLET | Freq: Every day | ORAL | Status: DC
  Administered 2023-08-30 – 2023-09-01 (×2): 40 mg via ORAL
  Filled 2023-08-30 (×2): qty 1

## 2023-08-30 MED ORDER — ONDANSETRON HCL 4 MG/2ML IJ SOLN
4.0000 mg | Freq: Four times a day (QID) | INTRAMUSCULAR | Status: DC | PRN
  Administered 2023-08-31: 4 mg via INTRAVENOUS

## 2023-08-30 MED ORDER — LABETALOL HCL 5 MG/ML IV SOLN
10.0000 mg | INTRAVENOUS | Status: DC | PRN
  Administered 2023-08-30 – 2023-08-31 (×2): 10 mg via INTRAVENOUS
  Administered 2023-08-31 (×3): 5 mg via INTRAVENOUS
  Filled 2023-08-30 (×2): qty 4

## 2023-08-30 MED ORDER — ACETAMINOPHEN 650 MG RE SUPP: 650.0000 mg | Freq: Four times a day (QID) | RECTAL | Status: DC | PRN

## 2023-08-30 MED ORDER — SODIUM CHLORIDE 0.9 % IV SOLN: INTRAVENOUS | Status: DC

## 2023-08-30 MED ORDER — NITROGLYCERIN 2 % TD OINT
1.0000 [in_us] | TOPICAL_OINTMENT | Freq: Four times a day (QID) | TRANSDERMAL | Status: DC | PRN
  Administered 2023-08-31: 1 [in_us] via TOPICAL
  Filled 2023-08-30 (×2): qty 1

## 2023-08-30 MED ORDER — SODIUM BICARBONATE 650 MG PO TABS: 1300.0000 mg | ORAL_TABLET | Freq: Three times a day (TID) | ORAL | Status: DC

## 2023-08-30 NOTE — Consult Note (Signed)
 Rogelia Copping, MD [facility]  [address], [address] [place], [place] [address] Phone: [phone] Fax : [phone]  Consultation  Referring Provider:     Dr. Laurita Primary Care Physician:  Manya Toribio SQUIBB, PA Primary Gastroenterologist: Sampson         Reason for Consultation:     Rectal bleeding  Date of Admission:  08/30/2023 Date of Consultation:  08/30/2023          HPI:   Trevor Meyer is a 58 y.o. male who recently underwent a robotic assisted right inguinal hernia repair with laparoscopic assisted peritoneal dialysis catheter placement with an inguinal hernia repair.  The patient also has a history of hypertension, end-stage renal disease on dialysis and a history of anemia with a hemoglobin down to 6.7 back in May with his normal hemoglobin being between 7.2 and 9 over the last few months. The patient was started on iron  and had received a transfusion back in May.  The patient has reported dark stools since that is likely from the iron . The patient was now admitted with a history of a large amount of bright red blood per rectum.  He had informed the emergency department that it was just when he was wiping. He also had endorsed some pressure from his rectum and felt like something had to be pushed back in.  His most recent labs have shown:  Component     Latest Ref Rng 08/10/2023 08/11/2023 08/12/2023 08/30/2023  Hemoglobin     13.0 - 17.0 g/dL 9.0 (L)  8.9 (L)  8.9 (L)  7.9 (L)   Hemoglobin      7.5 (L)      Hemoglobin      7.5 (L)      HCT     39.0 - 52.0 % 28.3 (L)  27.1 (L)  27.2 (L)  25.1 (L)   HCT      22.0 (L)      HCT      22.0 (L)       The patient has had no further GI bleeding.  He reports that what protrude out of his rectum was about the size of half of his pinky and then went back in with manual manipulation.  He denies any abdominal pain or previous history of a colonoscopy.  He states that he has been set up by his primary care provider for a screening  colonoscopy in November.  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   a.) CTA chest 03/21/2020:  acute aortic syndrome with intramural hematoma extending from the LEFT subclavian artery origin to the diaphragmatic hiatus   CKD (chronic kidney disease), stage V (HCC)    Clotting disorder (HCC) 2000   Hypertension 03/2020   Iron  deficiency anemia    Polycystic kidney disease    Right inguinal hernia    Secondary hyperparathyroidism of renal origin (HCC)    Umbilical hernia     Past Surgical History:  Procedure Laterality Date   CAPD INSERTION N/A 08/11/2023   Procedure: LAPAROSCOPIC INSERTION CONTINUOUS AMBULATORY PERITONEAL DIALYSIS  (CAPD) CATHETER;  Surgeon: Desiderio Schanz, MD;  Location: [facility] ORS;  Service: General;  Laterality: N/A;   HERNIORRHAPHY, INGUINAL, ROBOT-ASSISTED, LAPAROSCOPIC Right 08/11/2023   Procedure: HERNIORRHAPHY, INGUINAL, ROBOT-ASSISTED, LAPAROSCOPIC;  Surgeon: Desiderio Schanz, MD;  Location: [facility] ORS;  Service: General;  Laterality: Right;  UMBILICAL HERNIA REPAIR N/A 08/11/2023   Procedure: REPAIR, HERNIA, UMBILICAL, ADULT;  Surgeon: Desiderio Schanz, MD;  Location: [facility] ORS;  Service: General;  Laterality: N/A;    Prior to Admission medications   Medication Sig Start Date End Date Taking? Authorizing Provider  amLODipine  (NORVASC ) 10 MG tablet Take 1 tablet (10 mg total) by mouth daily. 03/27/20 07/21/27  Maree Hue, MD  calcium  acetate (PHOSLO ) 667 MG capsule Take 2 capsules (1,334 mg total) by mouth 3 (three) times daily with meals. 08/12/23   Caleen Qualia, MD  cloNIDine  (CATAPRES ) 0.1 MG tablet Take 1 tablet (0.1 mg total) by mouth 2 (two) times daily. 03/26/20   Maree Hue, MD  furosemide  (LASIX ) 40 MG tablet Take 1 tablet (40 mg total) by mouth daily. 08/18/23 11/16/23  Lemon Raisin, MD  iron  polysaccharides (NIFEREX) 150 MG capsule Take 1 capsule (150 mg total)  by mouth daily. 06/09/23 09/07/23  Von Bellis, MD  isosorbide  mononitrate (IMDUR ) 30 MG 24 hr tablet Take 1 tablet (30 mg total) by mouth daily. 03/27/20 07/21/27  Maree Hue, MD  labetalol  (NORMODYNE ) 200 MG tablet Take 1 tablet (200 mg total) by mouth 2 (two) times daily. 03/26/20 07/21/26  Maree Hue, MD  pantoprazole  (PROTONIX ) 40 MG tablet Take 1 tablet (40 mg total) by mouth daily. 08/18/23 11/16/23  Lemon Raisin, MD  patiromer  (VELTASSA ) 8.4 g packet Take 2 packets (16.8 g total) by mouth daily. 08/13/23   Amin, Sumayya, MD  Propylene Glycol (LUBRICANT EYE DROP) 0.6 % SOLN Place 1-2 drops into both eyes 3 (three) times daily as needed (dry/irritated eyes.).    [provider]  sodium bicarbonate  650 MG tablet Take 2 tablets (1,300 mg total) by mouth 3 (three) times daily. 08/12/23   Caleen Qualia, MD  sodium zirconium cyclosilicate  (LOKELMA ) 5 g packet Take 10 g by mouth daily. 08/12/23   Caleen Qualia, MD    Family History  Problem Relation Age of Onset   Cancer Mother    Stroke Father      Social History   Tobacco Use   Smoking status: Never    Passive exposure: Never   Smokeless tobacco: Never  Vaping Use   Vaping status: Never Used  Substance Use Topics   Alcohol  use: Never   Drug use: Never    Allergies as of 08/30/2023 - Review Complete 08/30/2023  Allergen Reaction Noted   Hydralazine  Other (See Comments) 03/23/2020    Review of Systems:    All systems reviewed and negative except where noted in HPI.   Physical Exam:  Vital signs in last 24 hours: Temp:  [98.2 F (36.8 C)] 98.2 F (36.8 C) (08/20 9361) Pulse Rate:  [92] 92 (08/20 0638) Resp:  [18] 18 (08/20 0638) BP: (157)/(97) 157/97 (08/20 0638) SpO2:  [100 %] 100 % (08/20 9361) Weight:  [64.9 kg] 64.9 kg (08/20 0634)   General:   Pleasant, cooperative in NAD Head:  Normocephalic and atraumatic. Eyes:   No icterus.   Conjunctiva pink. PERRLA. Ears:  Normal auditory acuity. Rectal:  Not  performed. Msk:  Symmetrical without gross deformities.    Extremities:  Without edema, cyanosis or clubbing. Neurologic:  Alert and oriented x3;  grossly normal neurologically. Skin:  Intact without significant lesions or rashes. Psych:  Alert and cooperative. Normal affect.  LAB RESULTS: Recent Labs    08/30/23 0639  WBC 5.1  HGB 7.9*  HCT 25.1*  PLT 181   BMET Recent Labs    08/30/23 [phone]  NA 146*  K 4.1  CL 110  CO2 24  GLUCOSE 101*  BUN 86*  CREATININE 10.05*  CALCIUM  9.3   LFT Recent Labs    08/30/23 0639  PROT 6.9  ALBUMIN 3.4*  AST 22  ALT 15  ALKPHOS 49  BILITOT 0.4   PT/INR Recent Labs    08/30/23 0736  LABPROT 14.5  INR 1.1    STUDIES: No results found.    Impression / Plan:   Assessment: Principal Problem:   Lower GI bleed Active Problems:   Rectal bleeding   Trevor Meyer is a 58 y.o. y/o male with who comes in with rectal bleeding and what he reported to be a piece of tissue protruding from his rectum that was placed back in his rectum manually by himself and reports that he had had no bleeding since then.  The patient has not had a colonoscopy in the past and is set up for screening colonoscopy this November.  The patient's hemoglobin is significantly lower today than it was 2 weeks ago.   Despite a another hemoglobin not being checked to see if the patient was trending down and despite the fact that he is not showing any signs of further bleeding and urgent GI consult was called.  It was also noted that the admitting physician thought this was likely a hemorrhoidal bleed versus polyps  Plan:  The patient will be set up for colonoscopy for tomorrow.  The patient has been put on a clear liquid diet today and will be given a prep for the colonoscopy for tomorrow.  The patient has been explained the plan and agrees with proceeding with the colonoscopy after being told the risks benefits and alternatives.  The patient has also been told  that he needs to finish the prep so a good look could be taken of a possible source of his rectal bleeding.  Thank you for involving me in the care of this patient.      LOS: 0 days   Rogelia Copping, MD, MD. NOLIA 08/30/2023, 9:30 AM,  Pager [phone] 7am-5pm  Check AMION for 5pm -7am coverage and on weekends   Note: This dictation was prepared with Dragon dictation along with smaller phrase technology. Any transcriptional errors that result from this process are unintentional.

## 2023-08-30 NOTE — ED Provider Notes (Signed)
 [facility] Provider Note    Event Date/Time   First MD Initiated Contact with Patient 08/30/23 [phone]     (approximate)   History   Rectal Bleeding   HPI  Trevor Meyer is a 58 y.o. male with history of hypertension, ESRD currently on dialysis Tuesdays and Thursdays with peritoneal dialysis with plans to start doing at home here shortly.  I did review patient's note from 08/18/2023.  He is on daily Lokelma . Patient reports a history of low blood levels down to 5 back in May that was related to his ESRD, anemia.  He received 2 units of blood and was started on iron  tablets.  He reports that since then he has had some dark stools.  However today was different where he had a large amount of bright red blood come out of his rectum.  He denies it just being with wiping.  He states that he thought he felt some pressure in his rectum area felt like he put something back inside heat.  He denies any abdominal pain, fevers or issues with his peritoneal dialysis.   Physical Exam   Triage Vital Signs: ED Triage Vitals  Encounter Vitals Group     BP 08/30/23 0638 (!) 157/97     Girls Systolic BP Percentile --      Girls Diastolic BP Percentile --      Boys Systolic BP Percentile --      Boys Diastolic BP Percentile --      Pulse Rate 08/30/23 0638 92     Resp 08/30/23 0638 18     Temp 08/30/23 0638 98.2 F (36.8 C)     Temp Source 08/30/23 0638 Oral     SpO2 08/30/23 0638 100 %     Weight 08/30/23 0634 143 lb (64.9 kg)     Height 08/30/23 0634 5' 6 (1.676 m)     Head Circumference --      Peak Flow --      Pain Score --      Pain Loc --      Pain Education --      Exclude from Growth Chart --     Most recent vital signs: Vitals:   08/30/23 0638  BP: (!) 157/97  Pulse: 92  Resp: 18  Temp: 98.2 F (36.8 C)  SpO2: 100%     General: Awake, no distress.  CV:  Good peripheral perfusion.  Resp:  Normal effort.  Abd:  No distention.  Soft and nontender.   Peritoneal dialysis catheter in place Other:  No obvious external hemorrhoid noted at this time.  No prolapsed internal hemorrhoid noted.  Rectal exam with trace amount of blood on it   ED Results / Procedures / Treatments   Labs (all labs ordered are listed, but only abnormal results are displayed) Labs Reviewed  CBC WITH DIFFERENTIAL/PLATELET - Abnormal; Notable for the following components:      Result Value   RBC 2.73 (*)    Hemoglobin 7.9 (*)    HCT 25.1 (*)    All other components within normal limits  COMPREHENSIVE METABOLIC PANEL WITH GFR - Abnormal; Notable for the following components:   Sodium 146 (*)    Glucose, Bld 101 (*)    BUN 86 (*)    Creatinine, Ser 10.05 (*)    Albumin 3.4 (*)    GFR, Estimated 5 (*)    All other components within normal limits     PROCEDURES:  Critical Care performed: No  .1-3 Lead EKG Interpretation  Performed by: Ernest Ronal BRAVO, MD Authorized by: Ernest Ronal BRAVO, MD     Interpretation: normal     ECG rate:  90   ECG rate assessment: normal     Rhythm: sinus rhythm     Ectopy: none     Conduction: normal      MEDICATIONS ORDERED IN ED: Medications - No data to display   IMPRESSION / MDM / ASSESSMENT AND PLAN / ED COURSE  I reviewed the triage vital signs and the nursing notes.   Patient's presentation is most consistent with acute presentation with potential threat to life or bodily function.  Patient comes in with episode of large amount of rectal bleeding.  No significant blood on this exam but does have trace amount.  Do not feel a CT angio would be beneficial at this time given hemodynamically stable and small amount of blood unlikely to be positive.  Could be a hemorrhoid given his description of feeling like he put something back inside but given patient's ESRD with poor reserve I think it be better to admit patient for monitoring his hemoglobins.  He also does report he is never had a colonoscopy done.  CMP shows  elevated creatinine consistent with known ESRD.  CBC does show hemoglobin of 7.9.  2 weeks ago it is 9. INR normal   Will discuss the hospitalist for admission for monitoring  The patient is on the cardiac monitor to evaluate for evidence of arrhythmia and/or significant heart rate changes.      FINAL CLINICAL IMPRESSION(S) / ED DIAGNOSES   Final diagnoses:  Rectal bleeding     Rx / DC Orders   ED Discharge Orders     None        Note:  This document was prepared using Dragon voice recognition software and may include unintentional dictation errors.   Ernest Ronal BRAVO, MD 08/30/23 [phone]

## 2023-08-30 NOTE — ED Triage Notes (Signed)
 Patient ambulatory to triage with steady gait, without difficulty or distress noted; pt reports having rectal bleeding since last night; denies any abd pain or hx ofs same

## 2023-08-30 NOTE — H&P (Addendum)
 History and Physical    Trevor Meyer FMW:[id number] DOB: March 26, 1965 DOA: 08/30/2023  PCP: Manya Toribio SQUIBB, PA (Confirm with patient/family/NH records and if not entered, this has to be entered at [facility] point of entry) Patient coming from: Home  I have personally briefly reviewed patient's old medical records in [facility]  Chief Complaint: Rectal bleeding  HPI: Trevor Meyer is a 58 y.o. male with medical history significant of CKD stage V on new office PD TTS (5 hours each seesion), HTN, presented with new onset of rectal bleeding.  Patient first had the rectal bleeding several days ago, when he went to bathroom to urinate but had full drops of blood in the toilet bowl and stopped by itself.  This morning, he went to bathroom to have bowel movement however he passed 1 moderate to large amount of bright red blood in the toilet but no stool.  When he wiped, he felt a small soft material about the size of small finger, he was able to push it back to sinus.  And no more bleeding followed.  Denied any abdominal pain no nausea vomiting, he is not taking any blood thinner.  3 weeks ago he started on office blood donor dialysis 5 hours each session on Tuesday Thursday and Saturday and last dialysis was yesterday.  Earlier 30-year he was found to be anemic with a normal iron  study he was given transfusion x 2 in May and July.  No history of hemorrhoids. ED Course: Afebrile, nontachycardic blood pressure 150/90 Obsession 100% on room air.  Hemoglobin 7.9, compared to baseline 8.9 about 2 weeks ago.  ED physician did rectal exam did not find any hemorrhoids. Review of Systems: As per HPI otherwise 14 point review of systems negative.    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   a.) CTA chest 03/21/2020:  acute aortic syndrome with intramural hematoma extending from the LEFT subclavian artery  origin to the diaphragmatic hiatus   CKD (chronic kidney disease), stage V (HCC)    Clotting disorder (HCC) 2000   Hypertension 03/2020   Iron  deficiency anemia    Polycystic kidney disease    Right inguinal hernia    Secondary hyperparathyroidism of renal origin (HCC)    Umbilical hernia     Past Surgical History:  Procedure Laterality Date   CAPD INSERTION N/A 08/11/2023   Procedure: LAPAROSCOPIC INSERTION CONTINUOUS AMBULATORY PERITONEAL DIALYSIS  (CAPD) CATHETER;  Surgeon: Desiderio Schanz, MD;  Location: [facility] ORS;  Service: General;  Laterality: N/A;   HERNIORRHAPHY, INGUINAL, ROBOT-ASSISTED, LAPAROSCOPIC Right 08/11/2023   Procedure: HERNIORRHAPHY, INGUINAL, ROBOT-ASSISTED, LAPAROSCOPIC;  Surgeon: Desiderio Schanz, MD;  Location: [facility] ORS;  Service: General;  Laterality: Right;   UMBILICAL HERNIA REPAIR N/A 08/11/2023   Procedure: REPAIR, HERNIA, UMBILICAL, ADULT;  Surgeon: Desiderio Schanz, MD;  Location: [facility] ORS;  Service: General;  Laterality: N/A;     reports that he has never smoked. He has never been exposed to tobacco smoke. He has never used smokeless tobacco. He reports that he does not drink alcohol  and does not use drugs.  Allergies  Allergen Reactions   Hydralazine  Other (See Comments)    Flushing and tachycardia       Family History  Problem Relation Age of Onset   Cancer Mother    Stroke Father      Prior to Admission medications  Medication Sig Start Date End Date Taking? Authorizing Provider  amLODipine  (NORVASC ) 10 MG tablet Take 1 tablet (10 mg total) by mouth daily. 03/27/20 07/21/27  Maree Hue, MD  calcium  acetate (PHOSLO ) 667 MG capsule Take 2 capsules (1,334 mg total) by mouth 3 (three) times daily with meals. 08/12/23   Caleen Qualia, MD  cloNIDine  (CATAPRES ) 0.1 MG tablet Take 1 tablet (0.1 mg total) by mouth 2 (two) times daily. 03/26/20   Maree Hue, MD  furosemide  (LASIX ) 40 MG tablet Take 1 tablet (40 mg total) by mouth daily. 08/18/23 11/16/23  Lemon Raisin, MD  iron  polysaccharides (NIFEREX) 150 MG capsule Take 1 capsule (150 mg total) by mouth daily. 06/09/23 09/07/23  Von Bellis, MD  isosorbide  mononitrate (IMDUR ) 30 MG 24 hr tablet Take 1 tablet (30 mg total) by mouth daily. 03/27/20 07/21/27  Maree Hue, MD  labetalol  (NORMODYNE ) 200 MG tablet Take 1 tablet (200 mg total) by mouth 2 (two) times daily. 03/26/20 07/21/26  Maree Hue, MD  pantoprazole  (PROTONIX ) 40 MG tablet Take 1 tablet (40 mg total) by mouth daily. 08/18/23 11/16/23  Lemon Raisin, MD  patiromer  (VELTASSA ) 8.4 g packet Take 2 packets (16.8 g total) by mouth daily. 08/13/23   Amin, Sumayya, MD  Propylene Glycol (LUBRICANT EYE DROP) 0.6 % SOLN Place 1-2 drops into both eyes 3 (three) times daily as needed (dry/irritated eyes.).    [provider]  sodium bicarbonate  650 MG tablet Take 2 tablets (1,300 mg total) by mouth 3 (three) times daily. 08/12/23   Caleen Qualia, MD  sodium zirconium cyclosilicate  (LOKELMA ) 5 g packet Take 10 g by mouth daily. 08/12/23   Caleen Qualia, MD    Physical Exam: Vitals:   08/30/23 9365 08/30/23 0638  BP:  (!) 157/97  Pulse:  92  Resp:  18  Temp:  98.2 F (36.8 C)  TempSrc:  Oral  SpO2:  100%  Weight: 64.9 kg   Height: 5' 6 (1.676 m)     Constitutional: NAD, calm, comfortable Vitals:   08/30/23 0634 08/30/23 0638  BP:  (!) 157/97  Pulse:  92  Resp:  18  Temp:  98.2 F (36.8 C)  TempSrc:  Oral  SpO2:  100%  Weight: 64.9 kg   Height: 5' 6 (1.676 m)    Eyes: PERRL, lids and conjunctivae normal ENMT: Mucous membranes are moist. Posterior pharynx clear of any exudate or lesions.Normal dentition.  Neck: normal, supple, no masses, no thyromegaly Respiratory: clear to auscultation bilaterally, no wheezing, no crackles. Normal respiratory effort. No accessory muscle use.  Cardiovascular: Regular rate and rhythm, no murmurs / rubs / gallops. No extremity edema. 2+ pedal pulses. No carotid bruits.  Abdomen: no tenderness, no  masses palpated. No hepatosplenomegaly. Bowel sounds positive.  Musculoskeletal: no clubbing / cyanosis. No joint deformity upper and lower extremities. Good ROM, no contractures. Normal muscle tone.  Skin: no rashes, lesions, ulcers. No induration Neurologic: CN 2-12 grossly intact. Sensation intact, DTR normal. Strength 5/5 in all 4.  Psychiatric: Normal judgment and insight. Alert and oriented x 3. Normal mood.     Labs on Admission: I have personally reviewed following labs and imaging studies  CBC: Recent Labs  Lab 08/30/23 0639  WBC 5.1  NEUTROABS 3.1  HGB 7.9*  HCT 25.1*  MCV 91.9  PLT 181   Basic Metabolic Panel: Recent Labs  Lab 08/30/23 0639  NA 146*  K 4.1  CL 110  CO2 24  GLUCOSE 101*  BUN 86*  CREATININE 10.05*  CALCIUM  9.3   GFR: Estimated Creatinine Clearance: 7.1 mL/min (A) (by C-G formula based on SCr of 10.05 mg/dL (H)). Liver Function Tests: Recent Labs  Lab 08/30/23 0639  AST 22  ALT 15  ALKPHOS 49  BILITOT 0.4  PROT 6.9  ALBUMIN 3.4*   No results for input(s): LIPASE, AMYLASE in the last 168 hours. No results for input(s): AMMONIA in the last 168 hours. Coagulation Profile: Recent Labs  Lab 08/30/23 0736  INR 1.1   Cardiac Enzymes: No results for input(s): CKTOTAL, CKMB, CKMBINDEX, TROPONINI in the last 168 hours. BNP (last 3 results) No results for input(s): PROBNP in the last 8760 hours. HbA1C: No results for input(s): HGBA1C in the last 72 hours. CBG: No results for input(s): GLUCAP in the last 168 hours. Lipid Profile: No results for input(s): CHOL, HDL, LDLCALC, TRIG, CHOLHDL, LDLDIRECT in the last 72 hours. Thyroid Function Tests: No results for input(s): TSH, T4TOTAL, FREET4, T3FREE, THYROIDAB in the last 72 hours. Anemia Panel: Recent Labs    08/30/23 0639  RETICCTPCT 1.7   Urine analysis:    Component Value Date/Time   COLORURINE COLORLESS (A) 06/03/2023 1055    APPEARANCEUR CLEAR (A) 06/03/2023 1055   LABSPEC 1.009 06/03/2023 1055   PHURINE 6.0 06/03/2023 1055   GLUCOSEU 150 (A) 06/03/2023 1055   HGBUR NEGATIVE 06/03/2023 1055   BILIRUBINUR NEGATIVE 06/03/2023 1055   KETONESUR NEGATIVE 06/03/2023 1055   PROTEINUR NEGATIVE 06/03/2023 1055   NITRITE NEGATIVE 06/03/2023 1055   LEUKOCYTESUR NEGATIVE 06/03/2023 1055    Radiological Exams on Admission: No results found.  EKG: None  Assessment/Plan Principal Problem:   Lower GI bleed Active Problems:   Rectal bleeding  (please populate well all problems here in Problem List. (For example, if patient is on BP meds at home and you resume or decide to hold them, it is a problem that needs to be her. Same for CAD, COPD, HLD and so on)  Lower GI bleeding Acute on chronic blood loss anemia -Source likely rectal, suspect internal hemorrhoid versus polyps. - Discussed with on-call GI, who will come to see patient on consult. NPO for now. - Trend H&H this afternoon and tonight, transfuse for significant drop of H&H or hemodynamic instability. - Continue p.o. PPI  HTN - Hold off home BP meds - Start as needed labetalol   CKD stage V - Euvolemic, mild uremia, given there is no active bleeding, will not administer DDAVP - Outpatient follow-up with nephrology for ongoing PD  Total time spent on patient care 55 minutes  DVT prophylaxis: SCD Code Status: Full code Family Communication: Son at bedside Disposition Plan: Patient sick with lower GI bleed requiring inpatient GI consultation and likely sigmoidoscopy versus colonoscopy inpatient, expect more than 2 midnight hospital stay Consults called: GI Admission status: Telemetry admission   Cort ONEIDA Mana MD [facility] Pager [phone]  08/30/2023, 9:22 AM

## 2023-08-31 ENCOUNTER — Encounter
Admission: EM | Disposition: A | Discharge status: Home / Self Care | Payer: Self-pay | Source: Home / Self Care | Attending: Emergency Medicine

## 2023-08-31 ENCOUNTER — Observation Stay: Admitting: Anesthesiology

## 2023-08-31 ENCOUNTER — Other Ambulatory Visit: Payer: Self-pay

## 2023-08-31 ENCOUNTER — Observation Stay: Admitting: General Practice

## 2023-08-31 ENCOUNTER — Encounter: Payer: Self-pay | Admitting: Internal Medicine

## 2023-08-31 DIAGNOSIS — K921 Melena: Secondary | ICD-10-CM | POA: Diagnosis not present

## 2023-08-31 DIAGNOSIS — D124 Benign neoplasm of descending colon: Principal | ICD-10-CM

## 2023-08-31 DIAGNOSIS — K642 Third degree hemorrhoids: Secondary | ICD-10-CM

## 2023-08-31 DIAGNOSIS — K635 Polyp of colon: Secondary | ICD-10-CM

## 2023-08-31 DIAGNOSIS — K922 Gastrointestinal hemorrhage, unspecified: Secondary | ICD-10-CM | POA: Diagnosis not present

## 2023-08-31 DIAGNOSIS — K648 Other hemorrhoids: Secondary | ICD-10-CM

## 2023-08-31 HISTORY — PX: HEMORRHOID SURGERY: SHX153

## 2023-08-31 HISTORY — PX: RECTAL EXAM UNDER ANESTHESIA: SHX6399

## 2023-08-31 HISTORY — PX: COLONOSCOPY: SHX5424

## 2023-08-31 HISTORY — PX: POLYPECTOMY: SHX149

## 2023-08-31 HISTORY — PX: HEMOSTASIS CLIP PLACEMENT: SHX6857

## 2023-08-31 LAB — TYPE AND SCREEN
ABO/RH(D): B POS
Antibody Screen: NEGATIVE
Unit division: 0

## 2023-08-31 LAB — BPAM RBC
Blood Product Expiration Date: 202509162359
ISSUE DATE / TIME: 202508201300
Unit Type and Rh: 7300

## 2023-08-31 LAB — BASIC METABOLIC PANEL WITH GFR
Anion gap: 12 (ref 5–15)
BUN: 69 mg/dL — ABNORMAL HIGH (ref 6–20)
CO2: 21 mmol/L — ABNORMAL LOW (ref 22–32)
Calcium: 8.3 mg/dL — ABNORMAL LOW (ref 8.9–10.3)
Chloride: 111 mmol/L (ref 98–111)
Creatinine, Ser: 9.18 mg/dL — ABNORMAL HIGH (ref 0.61–1.24)
GFR, Estimated: 6 mL/min — ABNORMAL LOW (ref >60)
Glucose, Bld: 84 mg/dL (ref 70–99)
Potassium: 4.1 mmol/L (ref 3.5–5.1)
Sodium: 144 mmol/L (ref 135–145)

## 2023-08-31 LAB — CBC
HCT: 26.2 % — ABNORMAL LOW (ref 39.0–52.0)
Hemoglobin: 8.5 g/dL — ABNORMAL LOW (ref 13.0–17.0)
MCH: 29.4 pg (ref 26.0–34.0)
MCHC: 32.4 g/dL (ref 30.0–36.0)
MCV: 90.7 fL (ref 80.0–100.0)
Platelets: 165 K/uL (ref 150–400)
RBC: 2.89 MIL/uL — ABNORMAL LOW (ref 4.22–5.81)
RDW: 13.3 % (ref 11.5–15.5)
WBC: 3.7 K/uL — ABNORMAL LOW (ref 4.0–10.5)
nRBC: 0 % (ref 0.0–0.2)

## 2023-08-31 SURGERY — HEMORRHOIDECTOMY
Anesthesia: General | Site: Rectum

## 2023-08-31 SURGERY — COLONOSCOPY
Anesthesia: General

## 2023-08-31 MED ORDER — FENTANYL CITRATE (PF) 100 MCG/2ML IJ SOLN
INTRAMUSCULAR | Status: AC
Start: 2023-08-31 — End: 2023-08-31
  Filled 2023-08-31: qty 2

## 2023-08-31 MED ORDER — PROPOFOL 10 MG/ML IV BOLUS
INTRAVENOUS | Status: DC | PRN
  Administered 2023-08-31 (×2): 50 mg via INTRAVENOUS
  Administered 2023-08-31: 120 mg via INTRAVENOUS
  Administered 2023-08-31: 150 ug/kg/min via INTRAVENOUS

## 2023-08-31 MED ORDER — ISOSORBIDE MONONITRATE ER 30 MG PO TB24
30.0000 mg | ORAL_TABLET | Freq: Every day | ORAL | Status: DC
  Administered 2023-08-31 – 2023-09-01 (×2): 30 mg via ORAL
  Filled 2023-08-31 (×2): qty 1

## 2023-08-31 MED ORDER — ACETAMINOPHEN 10 MG/ML IV SOLN
INTRAVENOUS | Status: DC | PRN
  Administered 2023-08-31: 1000 mg via INTRAVENOUS

## 2023-08-31 MED ORDER — ONDANSETRON HCL 4 MG/2ML IJ SOLN
INTRAMUSCULAR | Status: AC
  Filled 2023-08-31: qty 2

## 2023-08-31 MED ORDER — LABETALOL HCL 100 MG PO TABS
200.0000 mg | ORAL_TABLET | Freq: Two times a day (BID) | ORAL | Status: DC
  Administered 2023-08-31 – 2023-09-01 (×2): 200 mg via ORAL
  Filled 2023-08-31: qty 2
  Filled 2023-08-31: qty 1
  Filled 2023-08-31: qty 2

## 2023-08-31 MED ORDER — SODIUM CHLORIDE 0.9 % IV SOLN
INTRAVENOUS | Status: AC
  Filled 2023-08-31: qty 2

## 2023-08-31 MED ORDER — DEXMEDETOMIDINE HCL IN NACL 80 MCG/20ML IV SOLN
INTRAVENOUS | Status: DC | PRN
  Administered 2023-08-31 (×3): 4 ug via INTRAVENOUS

## 2023-08-31 MED ORDER — PROPOFOL 10 MG/ML IV BOLUS
INTRAVENOUS | Status: DC | PRN
  Administered 2023-08-31: 20 mg via INTRAVENOUS
  Administered 2023-08-31: 60 mg via INTRAVENOUS

## 2023-08-31 MED ORDER — AMLODIPINE BESYLATE 10 MG PO TABS
10.0000 mg | ORAL_TABLET | Freq: Every day | ORAL | Status: DC
  Administered 2023-08-31 – 2023-09-01 (×2): 10 mg via ORAL
  Filled 2023-08-31 (×2): qty 1

## 2023-08-31 MED ORDER — MIDAZOLAM HCL 2 MG/2ML IJ SOLN
INTRAMUSCULAR | Status: AC
Start: 2023-08-31 — End: 2023-08-31
  Filled 2023-08-31: qty 2

## 2023-08-31 MED ORDER — GELATIN ABSORBABLE 12-7 MM EX MISC
CUTANEOUS | Status: AC
  Filled 2023-08-31: qty 1

## 2023-08-31 MED ORDER — STERILE WATER FOR IRRIGATION IR SOLN: Status: DC | PRN

## 2023-08-31 MED ORDER — PROPOFOL 500 MG/50ML IV EMUL
INTRAVENOUS | Status: DC | PRN
  Administered 2023-08-31: 165 ug/kg/min via INTRAVENOUS

## 2023-08-31 MED ORDER — CLONIDINE HCL 0.1 MG PO TABS
0.1000 mg | ORAL_TABLET | Freq: Two times a day (BID) | ORAL | Status: DC
  Administered 2023-08-31 – 2023-09-01 (×2): 0.1 mg via ORAL
  Filled 2023-08-31 (×3): qty 1

## 2023-08-31 MED ORDER — BUPIVACAINE-EPINEPHRINE (PF) 0.5% -1:200000 IJ SOLN
INTRAMUSCULAR | Status: AC
  Filled 2023-08-31: qty 30

## 2023-08-31 MED ORDER — LIDOCAINE HCL (PF) 2 % IJ SOLN
INTRAMUSCULAR | Status: AC
Start: 2023-08-31 — End: 2023-08-31
  Filled 2023-08-31: qty 5

## 2023-08-31 MED ORDER — SODIUM CHLORIDE 0.9 % IV SOLN: INTRAVENOUS | Status: DC

## 2023-08-31 MED ORDER — DEXAMETHASONE SODIUM PHOSPHATE 10 MG/ML IJ SOLN
INTRAMUSCULAR | Status: AC
  Filled 2023-08-31: qty 1

## 2023-08-31 MED ORDER — LABETALOL HCL 5 MG/ML IV SOLN
10.0000 mg | INTRAVENOUS | Status: DC | PRN
  Administered 2023-08-31: 10 mg via INTRAVENOUS

## 2023-08-31 MED ORDER — MIDAZOLAM HCL 2 MG/2ML IJ SOLN
INTRAMUSCULAR | Status: DC | PRN
  Administered 2023-08-31: 1 mg via INTRAVENOUS

## 2023-08-31 MED ORDER — LIDOCAINE HCL (CARDIAC) PF 100 MG/5ML IV SOSY
PREFILLED_SYRINGE | INTRAVENOUS | Status: DC | PRN
  Administered 2023-08-31: 100 mg via INTRAVENOUS

## 2023-08-31 MED ORDER — POLYETHYLENE GLYCOL 3350 17 G PO PACK
17.0000 g | PACK | Freq: Every day | ORAL | Status: DC
  Administered 2023-09-01: 17 g via ORAL
  Filled 2023-08-31: qty 1

## 2023-08-31 MED ORDER — 0.9 % SODIUM CHLORIDE (POUR BTL) OPTIME
TOPICAL | Status: DC | PRN
  Administered 2023-08-31: 500 mL

## 2023-08-31 MED ORDER — MIDAZOLAM HCL 2 MG/2ML IJ SOLN
INTRAMUSCULAR | Status: AC
  Filled 2023-08-31: qty 2

## 2023-08-31 MED ORDER — SODIUM CHLORIDE 0.9 % IV SOLN
2.0000 g | INTRAVENOUS | Status: AC
  Administered 2023-08-31: 2 g via INTRAVENOUS

## 2023-08-31 MED ORDER — DEXAMETHASONE SODIUM PHOSPHATE 10 MG/ML IJ SOLN
INTRAMUSCULAR | Status: DC | PRN
  Administered 2023-08-31: 5 mg via INTRAVENOUS

## 2023-08-31 MED ORDER — FENTANYL CITRATE (PF) 100 MCG/2ML IJ SOLN
25.0000 ug | INTRAMUSCULAR | Status: DC | PRN
  Administered 2023-08-31: 25 ug via INTRAVENOUS

## 2023-08-31 MED ORDER — LACTATED RINGERS IV SOLN: INTRAVENOUS | Status: DC | PRN

## 2023-08-31 MED ORDER — PROPOFOL 1000 MG/100ML IV EMUL
INTRAVENOUS | Status: AC
  Filled 2023-08-31: qty 100

## 2023-08-31 MED ORDER — FENTANYL CITRATE (PF) 100 MCG/2ML IJ SOLN
INTRAMUSCULAR | Status: DC | PRN
  Administered 2023-08-31 (×3): 50 ug via INTRAVENOUS

## 2023-08-31 MED ORDER — HYDROMORPHONE HCL 1 MG/ML IJ SOLN
0.5000 mg | INTRAMUSCULAR | Status: DC | PRN
  Administered 2023-08-31: 0.5 mg via INTRAVENOUS
  Filled 2023-08-31: qty 0.5

## 2023-08-31 MED ORDER — GLYCOPYRROLATE 0.2 MG/ML IJ SOLN
INTRAMUSCULAR | Status: DC | PRN
Start: 2023-08-31 — End: 2023-08-31
  Administered 2023-08-31: .2 mg via INTRAVENOUS

## 2023-08-31 MED ORDER — ACETAMINOPHEN 10 MG/ML IV SOLN
INTRAVENOUS | Status: AC
  Filled 2023-08-31: qty 100

## 2023-08-31 MED ORDER — LABETALOL HCL 5 MG/ML IV SOLN
INTRAVENOUS | Status: AC
  Filled 2023-08-31: qty 4

## 2023-08-31 MED ORDER — BUPIVACAINE-EPINEPHRINE (PF) 0.5% -1:200000 IJ SOLN
INTRAMUSCULAR | Status: DC | PRN
  Administered 2023-08-31: 50 mL via INTRAMUSCULAR

## 2023-08-31 MED ORDER — FENTANYL CITRATE (PF) 100 MCG/2ML IJ SOLN
INTRAMUSCULAR | Status: AC
  Filled 2023-08-31: qty 2

## 2023-08-31 MED ORDER — ACETAMINOPHEN 500 MG PO TABS
1000.0000 mg | ORAL_TABLET | Freq: Four times a day (QID) | ORAL | Status: DC
  Administered 2023-08-31: 1000 mg via ORAL
  Filled 2023-08-31 (×3): qty 2

## 2023-08-31 MED ORDER — BUPIVACAINE LIPOSOME 1.3 % IJ SUSP
INTRAMUSCULAR | Status: AC
  Filled 2023-08-31: qty 20

## 2023-08-31 MED ORDER — OXYCODONE HCL 5 MG PO TABS
5.0000 mg | ORAL_TABLET | ORAL | Status: DC | PRN
  Filled 2023-08-31: qty 2

## 2023-08-31 MED ORDER — PROPOFOL 10 MG/ML IV BOLUS
INTRAVENOUS | Status: AC
  Filled 2023-08-31: qty 20

## 2023-08-31 MED ORDER — GELATIN ABSORBABLE 100 EX MISC
CUTANEOUS | Status: DC | PRN
  Administered 2023-08-31: 1

## 2023-08-31 MED ORDER — LIDOCAINE HCL (CARDIAC) PF 100 MG/5ML IV SOSY
PREFILLED_SYRINGE | INTRAVENOUS | Status: DC | PRN
  Administered 2023-08-31: 60 mg via INTRAVENOUS

## 2023-08-31 MED ORDER — DROPERIDOL 2.5 MG/ML IJ SOLN: 0.6250 mg | Freq: Once | INTRAMUSCULAR | Status: DC | PRN

## 2023-08-31 SURGICAL SUPPLY — 24 items
BRIEF MESH DISP 2XL (UNDERPADS AND DIAPERS) ×1 IMPLANT
DRAPE PERI LITHO V/GYN (MISCELLANEOUS) ×1 IMPLANT
DRAPE UNDER BUTTOCK W/FLU (DRAPES) ×1 IMPLANT
DRSG GAUZE FLUFF 36X18 (GAUZE/BANDAGES/DRESSINGS) ×1 IMPLANT
ELECTRODE CAUTERY BLDE TIP 2.5 (TIP) ×1 IMPLANT
ELECTRODE REM PT RTRN 9FT ADLT (ELECTROSURGICAL) ×1 IMPLANT
GLOVE SURG SYN 7.0 PF PI (GLOVE) ×1 IMPLANT
GLOVE SURG SYN 7.5 PF PI (GLOVE) ×1 IMPLANT
GOWN STRL REUS W/ TWL LRG LVL3 (GOWN DISPOSABLE) ×2 IMPLANT
KIT TURNOVER KIT A (KITS) ×1 IMPLANT
LABEL OR SOLS (LABEL) ×1 IMPLANT
MANIFOLD NEPTUNE II (INSTRUMENTS) ×1 IMPLANT
NDL HYPO 22X1.5 SAFETY MO (MISCELLANEOUS) ×1 IMPLANT
NEEDLE HYPO 22X1.5 SAFETY MO (MISCELLANEOUS) ×2 IMPLANT
NS IRRIG 500ML POUR BTL (IV SOLUTION) ×1 IMPLANT
PACK BASIN MINOR ARMC (MISCELLANEOUS) ×1 IMPLANT
PAD PREP OB/GYN DISP 24X41 (PERSONAL CARE ITEMS) ×1 IMPLANT
SHEARS HARMONIC 9CM CVD (BLADE) IMPLANT
SOLUTION PREP PVP 2OZ (MISCELLANEOUS) ×1 IMPLANT
SURGILUBE 2OZ TUBE FLIPTOP (MISCELLANEOUS) ×1 IMPLANT
SUT VIC AB 3-0 SH 27X BRD (SUTURE) IMPLANT
SYR 10ML LL (SYRINGE) ×2 IMPLANT
SYR 20ML LL LF (SYRINGE) ×1 IMPLANT
TRAP FLUID SMOKE EVACUATOR (MISCELLANEOUS) ×1 IMPLANT

## 2023-08-31 NOTE — Consult Note (Signed)
 Date of Consultation:  08/31/2023  Requesting Physician:  Lorane Poland, DO  Reason for Consultation:  Bleeding internal hemorrhoids  History of Present Illness: Trevor Meyer is a 58 y.o. male admitted yesterday with bright red blood per rectum.  The patient is well-known to me and had a prior robotic inguinal hernia repair with laparoscopic placement of a peritoneal dialysis catheter earlier this month.  The patient reports that after surgery he had issues with constipation.  Prior to surgery he had not had any issues with constipation and denies any prior bleeding episodes.  He presented to the emergency room yesterday reporting bright red blood per rectum that started the day before.  His hemoglobin had gone down to 7.2 was transfused a unit of red blood cells.  He had a colonoscopy today which only showed some polyps in the descending colon but no other source of bleeding besides internal hemorrhoids.  There has been some oozing that has been going down to his leg at the time of colonoscopy.  On my evaluation, the patient denies any perianal pain.  He is awake and alert and able to have a good conversation.  His main symptom he reports is bleeding but denies any perianal pain.  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   a.) CTA chest 03/21/2020:  acute aortic syndrome with intramural hematoma extending from the LEFT subclavian artery origin to the diaphragmatic hiatus   CKD (chronic kidney disease), stage V (HCC)    Clotting disorder (HCC) 2000   Hypertension 03/2020   Iron  deficiency anemia    Polycystic kidney disease    Right inguinal hernia    Secondary hyperparathyroidism of renal origin (HCC)    Umbilical hernia      Past Surgical History: Past Surgical History:  Procedure Laterality Date   CAPD INSERTION N/A 08/11/2023   Procedure: LAPAROSCOPIC  INSERTION CONTINUOUS AMBULATORY PERITONEAL DIALYSIS  (CAPD) CATHETER;  Surgeon: Desiderio Schanz, MD;  Location: [facility] ORS;  Service: General;  Laterality: N/A;   HERNIORRHAPHY, INGUINAL, ROBOT-ASSISTED, LAPAROSCOPIC Right 08/11/2023   Procedure: HERNIORRHAPHY, INGUINAL, ROBOT-ASSISTED, LAPAROSCOPIC;  Surgeon: Desiderio Schanz, MD;  Location: [facility] ORS;  Service: General;  Laterality: Right;   UMBILICAL HERNIA REPAIR N/A 08/11/2023   Procedure: REPAIR, HERNIA, UMBILICAL, ADULT;  Surgeon: Desiderio Schanz, MD;  Location: [facility] ORS;  Service: General;  Laterality: N/A;    Home Medications: Prior to Admission medications   Medication Sig Start Date End Date Taking? Authorizing Provider  amLODipine  (NORVASC ) 10 MG tablet Take 1 tablet (10 mg total) by mouth daily. 03/27/20 07/21/27 Yes Maree Hue, MD  calcium  acetate (PHOSLO ) 667 MG capsule Take 2 capsules (1,334 mg total) by mouth 3 (three) times daily with meals. 08/12/23  Yes Amin, Sumayya, MD  ciprofloxacin (CIPRO) 250 MG tablet Take 250 mg by mouth 2 (two) times daily. 08/29/23  Yes [provider]  cloNIDine  (CATAPRES ) 0.1 MG tablet Take 1 tablet (0.1 mg total) by mouth 2 (two) times daily. 03/26/20  Yes Maree Hue, MD  iron  polysaccharides (NIFEREX) 150 MG capsule Take 1 capsule (150 mg total) by mouth daily. 06/09/23 09/07/23 Yes Von Bellis, MD  isosorbide  mononitrate (IMDUR ) 30 MG 24 hr tablet Take 1 tablet (30 mg total) by mouth daily. 03/27/20 07/21/27 Yes Maree Hue, MD  labetalol  (NORMODYNE ) 200 MG tablet Take 1 tablet (200 mg total) by mouth 2 (two)  times daily. 03/26/20 07/21/26 Yes Maree Hue, MD  pantoprazole  (PROTONIX ) 40 MG tablet Take 1 tablet (40 mg total) by mouth daily. 08/18/23 11/16/23 Yes Lemon Raisin, MD  Propylene Glycol (LUBRICANT EYE DROP) 0.6 % SOLN Place 1-2 drops into both eyes 3 (three) times daily as needed (dry/irritated eyes.).   Yes [provider]  sodium bicarbonate  650 MG tablet Take 2 tablets (1,300 mg total) by  mouth 3 (three) times daily. 08/12/23  Yes Caleen Qualia, MD  sodium zirconium cyclosilicate  (LOKELMA ) 5 g packet Take 10 g by mouth daily. 08/12/23  Yes Caleen Qualia, MD  furosemide  (LASIX ) 40 MG tablet Take 1 tablet (40 mg total) by mouth daily. Patient not taking: Reported on 08/30/2023 08/18/23 11/16/23  Lemon Raisin, MD  patiromer  (VELTASSA ) 8.4 g packet Take 2 packets (16.8 g total) by mouth daily. Patient not taking: Reported on 08/30/2023 08/13/23   Caleen Qualia, MD    Allergies: Allergies  Allergen Reactions   Hydralazine  Other (See Comments)    Flushing and tachycardia       Social History:  reports that he has never smoked. He has never been exposed to tobacco smoke. He has never used smokeless tobacco. He reports that he does not drink alcohol  and does not use drugs.   Family History: Family History  Problem Relation Age of Onset   Cancer Mother    Stroke Father     Review of Systems: Review of Systems  Constitutional:  Negative for chills and fever.  Respiratory:  Negative for shortness of breath.   Cardiovascular:  Negative for chest pain.  Gastrointestinal:  Positive for blood in stool and constipation. Negative for abdominal pain, nausea and vomiting.    Physical Exam BP (!) 170/92 (BP Location: Right Arm)   Pulse 88   Temp 98.6 F (37 C) (Oral)   Resp 16   Ht 5' 6 (1.676 m)   Wt 64.9 kg   SpO2 99%   BMI 23.08 kg/m  CONSTITUTIONAL: No acute distress HEENT:  Normocephalic, atraumatic, extraocular motion intact. RESPIRATORY:  Normal respiratory effort without pathologic use of accessory muscles. CARDIOVASCULAR: Regular rhythm and rate  RECTAL: External exam reveals prolapsed internal hemorrhoid of right posterior colon which needed to be reduced manually.  There is minimally enlarged external hemorrhoid component.  On digital rectal exam, the internal component of the right posterior colon was also enlarged with the other 2 being more normal in size.  No  gross blood noted. MUSCULOSKELETAL:  Normal muscle strength and tone in all four extremities.  No peripheral edema or cyanosis. NEUROLOGIC:  Motor and sensation is grossly normal.  Cranial nerves are grossly intact. PSYCH:  Alert and oriented to person, place and time. Affect is normal.  Laboratory Analysis: Results for orders placed or performed during the hospital encounter of 08/30/23 (from the past 24 hours)  Hemoglobin and hematocrit, blood     Status: Abnormal   Collection Time: 08/30/23  9:06 PM  Result Value Ref Range   Hemoglobin 8.4 (L) 13.0 - 17.0 g/dL   HCT 74.0 (L) 60.9 - 47.9 %  Hemoglobin and hematocrit, blood     Status: Abnormal   Collection Time: 08/30/23 11:09 PM  Result Value Ref Range   Hemoglobin 8.5 (L) 13.0 - 17.0 g/dL   HCT 74.3 (L) 60.9 - 47.9 %  CBC     Status: Abnormal   Collection Time: 08/31/23  3:35 AM  Result Value Ref Range   WBC 3.7 (L) 4.0 -  10.5 K/uL   RBC 2.89 (L) 4.22 - 5.81 MIL/uL   Hemoglobin 8.5 (L) 13.0 - 17.0 g/dL   HCT 73.7 (L) 60.9 - 47.9 %   MCV 90.7 80.0 - 100.0 fL   MCH 29.4 26.0 - 34.0 pg   MCHC 32.4 30.0 - 36.0 g/dL   RDW 86.6 88.4 - 84.4 %   Platelets 165 150 - 400 K/uL   nRBC 0.0 0.0 - 0.2 %  Basic metabolic panel     Status: Abnormal   Collection Time: 08/31/23  3:35 AM  Result Value Ref Range   Sodium 144 135 - 145 mmol/L   Potassium 4.1 3.5 - 5.1 mmol/L   Chloride 111 98 - 111 mmol/L   CO2 21 (L) 22 - 32 mmol/L   Glucose, Bld 84 70 - 99 mg/dL   BUN 69 (H) 6 - 20 mg/dL   Creatinine, Ser 0.81 (H) 0.61 - 1.24 mg/dL   Calcium  8.3 (L) 8.9 - 10.3 mg/dL   GFR, Estimated 6 (L) >60 mL/min   Anion gap 12 5 - 15    Imaging: No results found.  Assessment and Plan: This is a 58 y.o. male with bleeding internal hemorrhoid.  - Discussed with patient the findings on exam and colonoscopy today.  Overall discussed with him that the biggest risk factor for hemorrhoid issues is constipation.  He did experience constipation after  his last surgery but discussed with him that likely he already has some enlarged tissues before and the constipation aggravated things further.  Discussed with him that given the significant bleeding he had at home requiring a blood transfusion today, it would be prudent to proceed with hemorrhoidectomy to decrease this risk further.  Discussed with him the potential options for doing this outpatient versus here in the hospital the patient would prefer to do it while he is here.  I do agree with that given the bleeding episode that he had. - Discussed with the patient the plan then for an exam under anesthesia with hemorrhoidectomy.  Reviewed with him the surgery at length including the dilation of the anal canal so we can evaluate the hemorrhoid tissue better, excision of the hemorrhoid tissue, risks of bleeding, infection, injury to surrounding structures, postoperative pain and pain control, hospital stay, he is willing to proceed.  Given that he had anesthesia earlier today for his colonoscopy, I have also discussed this with his wife who has given verbal consent over the phone. - Will take him to the OR today for surgery pending anesthesia/OR availability.  All of his questions have been answered.  I spent 60 minutes dedicated to the care of this patient on the date of this encounter to include pre-visit review of records, face-to-face time with the patient discussing diagnosis and management, and any post-visit coordination of care.   Aloysius Sheree Plant, MD [facility] Pg:  [phone]

## 2023-08-31 NOTE — Op Note (Signed)
 [facility] Gastroenterology Patient Name: Trevor Meyer Procedure Date: 08/31/2023 11:07 AM MRN: [id number] Account #: [id number] Date of Birth: Nov 23, 1965 Admit Type: Inpatient Age: 58 Room: [facility] ENDO ROOM 4 Gender: Male Note Status: Finalized Instrument Name: Colon Scope [id number] Procedure:             Colonoscopy Indications:           Hematochezia Providers:             Rogelia Copping MD, MD Referring MD:          Toribio SQUIBB. Waddell (Referring MD) Medicines:             Propofol  per Anesthesia Complications:         No immediate complications. Procedure:             Pre-Anesthesia Assessment:                        - Prior to the procedure, a History and Physical was                         performed, and patient medications and allergies were                         reviewed. The patient's tolerance of previous                         anesthesia was also reviewed. The risks and benefits                         of the procedure and the sedation options and risks                         were discussed with the patient. All questions were                         answered, and informed consent was obtained. Prior                         Anticoagulants: The patient has taken no anticoagulant                         or antiplatelet agents. ASA Grade Assessment: II - A                         patient with mild systemic disease. After reviewing                         the risks and benefits, the patient was deemed in                         satisfactory condition to undergo the procedure.                        After obtaining informed consent, the colonoscope was                         passed under direct vision. Throughout the procedure,  the patient's blood pressure, pulse, and oxygen                         saturations were monitored continuously. The                         Colonoscope was introduced through the anus and                          advanced to the the cecum, identified by appendiceal                         orifice and ileocecal valve. The colonoscopy was                         performed without difficulty. The patient tolerated                         the procedure well. The quality of the bowel                         preparation was excellent. Findings:      Two pedunculated polyps were found in the descending colon. The polyps       were 6 to 7 mm in size. These polyps were removed with a cold snare.       Resection and retrieval were complete. To close a defect after       polypectomy, one hemostatic clip was successfully placed (MR       conditional). Clip manufacturer: [employer]. There was no       bleeding at the end of the procedure.      The digital rectal exam findings include internal hemorrhoids that       prolapse with straining, but require manual replacement into the anal       canal (Grade III). Impression:            - Two 6 to 7 mm polyps in the descending colon,                         removed with a cold snare. Resected and retrieved.                         Clip (MR conditional) was placed. Clip manufacturer:                         [employer].                        - Internal hemorrhoids that prolapse with straining,                         but require manual replacement into the anal canal                         (Grade III) found on digital rectal exam.                        - The source of the active bleeding is clearly the  hemorrhoids. Recommendation:        - Return patient to hospital ward for ongoing care.                        - Clear liquid diet.                        - If the pathology report reveals adenomatous tissue,                         then repeat the colonoscopy for surveillance in 5                         years.                        - Refer to a Careers adviser. Procedure Code(s):     --- Professional ---                         [phone], Colonoscopy, flexible; with removal of                         tumor(s), polyp(s), or other lesion(s) by snare                         technique Diagnosis Code(s):     --- Professional ---                        K92.1, Melena (includes Hematochezia)                        D12.4, Benign neoplasm of descending colon CPT copyright 2022 American Medical Association. All rights reserved. The codes documented in this report are preliminary and upon coder review may  be revised to meet current compliance requirements. Rogelia Copping MD, MD 08/31/2023 11:50:01 AM This report has been signed electronically. Number of Addenda: 0 Note Initiated On: 08/31/2023 11:07 AM Scope Withdrawal Time: 0 hours 8 minutes 54 seconds  Total Procedure Duration: 0 hours 13 minutes 5 seconds  Estimated Blood Loss:  Estimated blood loss: none.      [facility]

## 2023-08-31 NOTE — Progress Notes (Signed)
 PROGRESS NOTE    Trevor Meyer  FMW:[id number] DOB: July 28, 1965 DOA: 08/30/2023 PCP: Manya Toribio SQUIBB, PA  Chief Complaint  Patient presents with   Rectal Bleeding    Hospital Course:  Trevor Meyer 58 year old male with chronic anemia, CKD stage V on new office peritoneal dialysis TTS, hypertension, who present with new onset rectal bleeding. On arrival to the ED hemoglobin found to be 7.9 which was down from baseline. GI was consulted and performed colonoscopy 8/21 which revealed multiple actively bleeding hemorrhoids.  General surgery was consulted and took patient for hemorrhoidectomy on 8/21.  Subjective: Seen after colonoscopy this morning.  He reports no acute complaints or concerns.   Objective: Vitals:   08/31/23 1019 08/31/23 1142 08/31/23 1239 08/31/23 1341  BP: (!) 167/85 111/74 (!) 170/92 (!) 180/96  Pulse: 65  88 76  Resp: 18 16  16   Temp: 98.4 F (36.9 C) 97.8 F (36.6 C) 98.6 F (37 C)   TempSrc:  Temporal Oral   SpO2: 100% 99% 99% 100%  Weight:      Height:        Intake/Output Summary (Last 24 hours) at 08/31/2023 1610 Last data filed at 08/31/2023 1549 Gross per 24 hour  Intake 1380 ml  Output 100 ml  Net 1280 ml   Filed Weights   08/30/23 0634  Weight: 64.9 kg    Examination: General exam: Appears calm and comfortable, NAD  Respiratory system: No work of breathing, symmetric chest wall expansion Cardiovascular system: S1 & S2 heard, RRR.  Gastrointestinal system: Abdomen is nondistended, soft and nontender.  Neuro: Alert and oriented. No focal neurological deficits. Extremities: Symmetric, expected ROM Skin: No rashes, lesions Psychiatry: Demonstrates appropriate judgement and insight. Mood & affect appropriate for situation.   Assessment & Plan:  Principal Problem:   Lower GI bleed Active Problems:   Rectal bleeding   Polyp of descending colon   Bleeding hemorrhoids - Status post 1 unit PRBC - Additional blood loss from  hemorrhoidectomy, expect further downtrend.  Repeat hemoglobin in AM.  May require additional transfusion  Colonic polyps - Incidentally seen on colonoscopy today. - Follow pathology with gastroenterology outpatient  ESRD - Recently initiated on peritoneal dialysis training - Nephrology has been consulted.  At this time labs reveal no urgent need for dialysis.  They will continue to follow  Acute on chronic anemia - Status post 1 unit PRBC - Repeat hemoglobin  Hypertension - Resume home meds    DVT prophylaxis: Hold AC.  SCDs only   Code Status: Full Code Disposition: Admitted for observation.  Pending resolution in hemoglobin.  Hopefully able to DC in am  Consultants:  Treatment Team:  Consulting Physician: Jinny Carmine, MD Consulting Physician: Desiderio Schanz, MD  Procedures:    Antimicrobials:  Anti-infectives (From admission, onward)    Start     Dose/Rate Route Frequency Ordered Stop   09/01/23 0600  [MAR Hold]  cefoTEtan  (CEFOTAN ) 2 g in sodium chloride  0.9 % 100 mL IVPB        (MAR Hold since Thu 08/31/2023 at 1338.Hold Reason: Transfer to a Procedural area)   2 g 200 mL/hr over 30 Minutes Intravenous On call to O.R. 08/31/23 1332 08/31/23 1410       Data Reviewed: I have personally reviewed following labs and imaging studies CBC: Recent Labs  Lab 08/30/23 0639 08/30/23 1144 08/30/23 2106 08/30/23 2309 08/31/23 0335  WBC 5.1  --   --   --  3.7*  NEUTROABS  3.1  --   --   --   --   HGB 7.9* 7.2* 8.4* 8.5* 8.5*  HCT 25.1* 22.5* 25.9* 25.6* 26.2*  MCV 91.9  --   --   --  90.7  PLT 181  --   --   --  165   Basic Metabolic Panel: Recent Labs  Lab 08/30/23 0639 08/31/23 0335  NA 146* 144  K 4.1 4.1  CL 110 111  CO2 24 21*  GLUCOSE 101* 84  BUN 86* 69*  CREATININE 10.05* 9.18*  CALCIUM  9.3 8.3*   GFR: Estimated Creatinine Clearance: 7.8 mL/min (A) (by C-G formula based on SCr of 9.18 mg/dL (H)). Liver Function Tests: Recent Labs  Lab  08/30/23 0639  AST 22  ALT 15  ALKPHOS 49  BILITOT 0.4  PROT 6.9  ALBUMIN 3.4*   CBG: No results for input(s): GLUCAP in the last 168 hours.  No results found for this or any previous visit (from the past 240 hours).   Radiology Studies: No results found.  Scheduled Meds:  [MAR Hold] sodium chloride    Intravenous Once   [MAR Hold] pantoprazole   40 mg Oral Daily   Continuous Infusions:   LOS: 1 day  MDM: Patient is high risk for one or more organ failure.  They necessitate ongoing hospitalization for continued IV therapies and subsequent lab monitoring. Total time spent interpreting labs and vitals, reviewing the medical record, coordinating care amongst consultants and care team members, directly assessing and discussing care with the patient and/or family: 55 min  Michala Deblanc, DO [facility]  To contact the attending physician between 7A-7P please use Epic Chat. To contact the covering physician during after hours 7P-7A, please review Amion.  08/31/2023, 4:10 PM   *This document has been created with the assistance of dictation software. Please excuse typographical errors. *

## 2023-08-31 NOTE — Anesthesia Preprocedure Evaluation (Addendum)
 Anesthesia Evaluation  Patient identified by MRN, date of birth, ID band Patient awake    Reviewed: Allergy & Precautions, H&P , NPO status , Patient's Chart, lab work & pertinent test results, reviewed documented beta blocker date and time   Airway Mallampati: II  TM Distance: >3 FB Neck ROM: full    Dental  (+) Teeth Intact   Pulmonary    Pulmonary exam normal        Cardiovascular Exercise Tolerance: Poor hypertension, On Medications and Pt. on home beta blockers Normal cardiovascular exam Rhythm:regular Rate:Normal     Neuro/Psych negative neurological ROS  negative psych ROS   GI/Hepatic negative GI ROS, Neg liver ROS,,,  Endo/Other  negative endocrine ROS    Renal/GU ESRFRenal disease  negative genitourinary   Musculoskeletal   Abdominal   Peds  Hematology  (+) Blood dyscrasia, anemia   Anesthesia Other Findings Rectal bleeding  Past Medical History: No date: Anemia of chronic renal failure No date: Aortic atherosclerosis (HCC) 03/21/2020: Aortic mural thrombus (HCC)     Comment:  a.) CTA chest 03/21/2020:  acute aortic syndrome with               intramural hematoma extending from the LEFT subclavian               artery origin to the diaphragmatic hiatus No date: CKD (chronic kidney disease), stage V (HCC) 2000: Clotting disorder (HCC) 03/2020: Hypertension No date: Iron  deficiency anemia No date: Polycystic kidney disease No date: Right inguinal hernia No date: Secondary hyperparathyroidism of renal origin (HCC) No date: Umbilical hernia History reviewed. No pertinent surgical history.   Reproductive/Obstetrics negative OB ROS                              Anesthesia Physical Anesthesia Plan  ASA: 3  Anesthesia Plan: General   Post-op Pain Management: Minimal or no pain anticipated   Induction: Intravenous  PONV Risk Score and Plan: 3 and TIVA  Airway  Management Planned: Natural Airway  Additional Equipment:   Intra-op Plan:   Post-operative Plan:   Informed Consent: I have reviewed the patients History and Physical, chart, labs and discussed the procedure including the risks, benefits and alternatives for the proposed anesthesia with the patient or authorized representative who has indicated his/her understanding and acceptance.     Dental Advisory Given  Plan Discussed with: CRNA  Anesthesia Plan Comments:          Anesthesia Quick Evaluation

## 2023-08-31 NOTE — Transfer of Care (Signed)
 Immediate Anesthesia Transfer of Care Note  Patient: Trevor Meyer  Procedure(s) Performed: COLONOSCOPY POLYPECTOMY, INTESTINE CONTROL OF HEMORRHAGE, GI TRACT, ENDOSCOPIC, BY CLIPPING OR OVERSEWING  Patient Location: PACU  Anesthesia Type:General  Level of Consciousness: drowsy and patient cooperative  Airway & Oxygen Therapy: Patient Spontanous Breathing and Patient connected to face mask oxygen  Post-op Assessment: Report given to RN and Post -op Vital signs reviewed and stable  Post vital signs: Reviewed and stable  Last Vitals:  Vitals Value Taken Time  BP 111/74 08/31/23 11:42  Temp 36.6 C 08/31/23 11:42  Pulse 95 08/31/23 11:44  Resp 16 08/31/23 11:44  SpO2 98 % 08/31/23 11:44  Vitals shown include unfiled device data.  Last Pain:  Vitals:   08/31/23 1142  TempSrc: Temporal  PainSc: Asleep         Complications: No notable events documented.

## 2023-08-31 NOTE — Op Note (Signed)
 Procedure Date:  08/31/2023  Pre-operative Diagnosis:  Bleeding internal hemorrhoids  Post-operative Diagnosis: Bleeding internal hemorrhoids  Procedure:  Exam under Anesthesia, Hemorrhoidectomy of 3 columns  Surgeon:  Aloysius Sheree Plant, MD  Anesthesia:  General endotracheal  Estimated Blood Loss:  100 ml  Specimens: Right posterior column Left lateral hemorrhoid Right anterior hemorrhoid  Complications:  None  Indications for Procedure:  This is a 58 y.o. male with a history of bleeding internal hemorrhoids, presenting for hemorrhoidectomy.  The risks of bleeding, infection, bowel injury, and need for further procedures were all discussed with the patient and he was willing to proceed.   Description of Procedure: The patient was correctly identified in the preoperative area and brought into the operating room.  The patient was placed supine with VTE prophylaxis in place.  Appropriate time-outs were performed.  Anesthesia was induced and the patient was intubated.  Appropriate antibiotics were infused.  The patient was then placed in high lithotomy position.   The perianal area was prepped and draped in usual sterile fashion.  He had a very enlarged and prolapsed right posterior hemorrhoid.  The sphincter was digitally dilated.  Then the anoscope was inserted and the anal canal was evaluated, revealing friable internal hemorrhoids of all three areas, with the right posterior being very enlarged and prolapsed.  The bivalve retractor was then inserted, and we started with the right posterior column.  Cautery was used to incise into the external component and Harmonic was used to take the column to the pedicle.  There was some bleeding from the pedicle which required two 3-0 Vicryl sutures.  Then we moved to the left lateral area and Harmonic was used to take the internal hemorrhoid.  Cautery was used for hemostasis.  Then the right anterior internal hemorrhoid was taken also using Harmonic.   There was also bleeding from the pedicle which required two 3-0 Vicryl sutures.  The anal canal was then irrigated and there was good hemostasis.  50 ml of Exparel  solution was infiltrated into the perianal area and as bilateral pudendal blocks.  A large gelfoam gauze was rolled and inserted into the anal canal for further hemostasis.  The perianal area was cleaned and dressed with fluffed gauze and mesh underwear.  The patient was emerged from anesthesia and extubated and brought to the recovery room for further management.  The patient tolerated the procedure well and all counts were correct at the end of the case.   Aloysius Sheree Plant, MD

## 2023-08-31 NOTE — TOC CM/SW Note (Signed)
 Transition of Care [facility]) - Inpatient Brief Assessment   Patient Details  Name: Pershing Skidmore MRN: [id number] Date of Birth: 1965-09-02  Transition of Care [facility]) CM/SW Contact:    Corean ONEIDA Haddock, RN Phone Number: 08/31/2023, 9:53 AM   Clinical Narrative:   Transition of Care [facility]) Screening Note   Patient Details  Name: Jarrad Mclees Date of Birth: 1965/11/14   Transition of Care [facility]) CM/SW Contact:    Corean ONEIDA Haddock, RN Phone Number: 08/31/2023, 9:53 AM    Transition of Care Department [facility]) has reviewed patient and no TOC needs have been identified at this time. . If new patient transition needs arise, please place a TOC consult.    Transition of Care Asessment: Insurance and Status: Insurance coverage has been reviewed Patient has primary care physician: Yes       Social Drivers of Health Review: SDOH reviewed no interventions necessary Readmission risk has been reviewed: Yes Transition of care needs: no transition of care needs at this time

## 2023-08-31 NOTE — Anesthesia Postprocedure Evaluation (Signed)
 Anesthesia Post Note  Patient: Ismaeel Arvelo  Procedure(s) Performed: HEMORRHOIDECTOMY (Rectum) EXAM UNDER ANESTHESIA, RECTUM (Rectum)  Patient location during evaluation: PACU Anesthesia Type: General Level of consciousness: awake and alert, oriented and patient cooperative Pain management: pain level controlled Vital Signs Assessment: post-procedure vital signs reviewed and stable Respiratory status: spontaneous breathing, nonlabored ventilation and respiratory function stable Cardiovascular status: blood pressure returned to baseline and stable (required few boluses of labetalol  for hypotension) Postop Assessment: adequate PO intake Anesthetic complications: no   No notable events documented.   Last Vitals:  Vitals:   08/31/23 1639 08/31/23 1645  BP:  (!) 184/97  Pulse: 67 66  Resp: 16 13  Temp:  (!) 36.4 C  SpO2: 99% 97%    Last Pain:  Vitals:   08/31/23 1645  TempSrc:   PainSc: 4                  Alfonso Ruths

## 2023-08-31 NOTE — Anesthesia Preprocedure Evaluation (Addendum)
 Anesthesia Evaluation  Patient identified by MRN, date of birth, ID band Patient awake    Reviewed: Allergy & Precautions, H&P , NPO status , Patient's Chart, lab work & pertinent test results, reviewed documented beta blocker date and time   History of Anesthesia Complications Negative for: history of anesthetic complications  Airway Mallampati: II  TM Distance: >3 FB Neck ROM: full    Dental  (+) Teeth Intact, Dental Advidsory Given   Pulmonary neg pulmonary ROS   Pulmonary exam normal        Cardiovascular Exercise Tolerance: Poor hypertension, On Medications and Pt. on home beta blockers (-) angina (-) Past MI and (-) Cardiac Stents Normal cardiovascular exam(-) dysrhythmias (-) Valvular Problems/Murmurs Rhythm:regular Rate:Normal     Neuro/Psych negative neurological ROS  negative psych ROS   GI/Hepatic negative GI ROS, Neg liver ROS,,,  Endo/Other  negative endocrine ROS    Renal/GU ESRFRenal disease  negative genitourinary   Musculoskeletal   Abdominal   Peds  Hematology  (+) Blood dyscrasia, anemia   Anesthesia Other Findings Rectal bleeding  Past Medical History: No date: Anemia of chronic renal failure No date: Aortic atherosclerosis (HCC) 03/21/2020: Aortic mural thrombus (HCC)     Comment:  a.) CTA chest 03/21/2020:  acute aortic syndrome with               intramural hematoma extending from the LEFT subclavian               artery origin to the diaphragmatic hiatus No date: CKD (chronic kidney disease), stage V (HCC) 2000: Clotting disorder (HCC) 03/2020: Hypertension No date: Iron  deficiency anemia No date: Polycystic kidney disease No date: Right inguinal hernia No date: Secondary hyperparathyroidism of renal origin (HCC) No date: Umbilical hernia History reviewed. No pertinent surgical history.   Reproductive/Obstetrics negative OB ROS                               Anesthesia Physical Anesthesia Plan  ASA: 3  Anesthesia Plan: General   Post-op Pain Management: Minimal or no pain anticipated   Induction: Intravenous  PONV Risk Score and Plan: 3 and Ondansetron , Dexamethasone , Midazolam  and Treatment may vary due to age or medical condition  Airway Management Planned: LMA  Additional Equipment:   Intra-op Plan:   Post-operative Plan: Extubation in OR  Informed Consent: I have reviewed the patients History and Physical, chart, labs and discussed the procedure including the risks, benefits and alternatives for the proposed anesthesia with the patient or authorized representative who has indicated his/her understanding and acceptance.     Dental Advisory Given  Plan Discussed with: CRNA  Anesthesia Plan Comments:          Anesthesia Quick Evaluation

## 2023-08-31 NOTE — Transfer of Care (Signed)
 Immediate Anesthesia Transfer of Care Note  Patient: Trevor Meyer  Procedure(s) Performed: HEMORRHOIDECTOMY (Rectum) EXAM UNDER ANESTHESIA, RECTUM (Rectum)  Patient Location: PACU  Anesthesia Type:General  Level of Consciousness: drowsy  Airway & Oxygen Therapy: Patient Spontanous Breathing and Patient connected to nasal cannula oxygen  Post-op Assessment: Report given to RN and Post -op Vital signs reviewed and stable  Post vital signs: stable  Last Vitals:  Vitals Value Taken Time  BP 156/73 08/31/23 16:10  Temp    Pulse 66 08/31/23 16:15  Resp 15 08/31/23 16:15  SpO2 99 % 08/31/23 16:15  Vitals shown include unfiled device data.  Last Pain:  Vitals:   08/31/23 1341  TempSrc:   PainSc: 0-No pain         Complications: No notable events documented.

## 2023-08-31 NOTE — Anesthesia Procedure Notes (Signed)
 Procedure Name: General with mask airway Date/Time: 08/31/2023 11:27 AM  Performed by: Ledora Duncan, CRNAPre-anesthesia Checklist: Patient identified, Emergency Drugs available, Suction available and Patient being monitored Patient Re-evaluated:Patient Re-evaluated prior to induction Oxygen Delivery Method: Simple face mask Induction Type: IV induction Placement Confirmation: positive ETCO2 and breath sounds checked- equal and bilateral Dental Injury: Teeth and Oropharynx as per pre-operative assessment

## 2023-08-31 NOTE — Anesthesia Procedure Notes (Signed)
 Procedure Name: LMA Insertion Date/Time: 08/31/2023 2:25 PM  Performed by: Gillermo Spruce I, CRNAPre-anesthesia Checklist: Patient identified, Patient being monitored, Timeout performed, Emergency Drugs available and Suction available Patient Re-evaluated:Patient Re-evaluated prior to induction Oxygen Delivery Method: Circle system utilized Preoxygenation: Pre-oxygenation with 100% oxygen Induction Type: IV induction Ventilation: Mask ventilation without difficulty LMA: LMA inserted LMA Size: 4.0 Tube type: Oral Number of attempts: 1 Placement Confirmation: positive ETCO2 and breath sounds checked- equal and bilateral Tube secured with: Tape Dental Injury: Teeth and Oropharynx as per pre-operative assessment

## 2023-09-01 ENCOUNTER — Encounter: Payer: Self-pay | Admitting: Surgery

## 2023-09-01 DIAGNOSIS — K922 Gastrointestinal hemorrhage, unspecified: Secondary | ICD-10-CM | POA: Diagnosis not present

## 2023-09-01 LAB — CBC WITH DIFFERENTIAL/PLATELET
Abs Immature Granulocytes: 0.02 K/uL (ref 0.00–0.07)
Basophils Absolute: 0 K/uL (ref 0.0–0.1)
Basophils Relative: 0 %
Eosinophils Absolute: 0 K/uL (ref 0.0–0.5)
Eosinophils Relative: 0 %
HCT: 24 % — ABNORMAL LOW (ref 39.0–52.0)
Hemoglobin: 7.8 g/dL — ABNORMAL LOW (ref 13.0–17.0)
Immature Granulocytes: 0 %
Lymphocytes Relative: 8 %
Lymphs Abs: 0.4 K/uL — ABNORMAL LOW (ref 0.7–4.0)
MCH: 29.3 pg (ref 26.0–34.0)
MCHC: 32.5 g/dL (ref 30.0–36.0)
MCV: 90.2 fL (ref 80.0–100.0)
Monocytes Absolute: 0.2 K/uL (ref 0.1–1.0)
Monocytes Relative: 5 %
Neutro Abs: 4.7 K/uL (ref 1.7–7.7)
Neutrophils Relative %: 87 %
Platelets: 135 K/uL — ABNORMAL LOW (ref 150–400)
RBC: 2.66 MIL/uL — ABNORMAL LOW (ref 4.22–5.81)
RDW: 13.3 % (ref 11.5–15.5)
WBC: 5.3 K/uL (ref 4.0–10.5)
nRBC: 0 % (ref 0.0–0.2)

## 2023-09-01 LAB — PHOSPHORUS: Phosphorus: 5 mg/dL — ABNORMAL HIGH (ref 2.5–4.6)

## 2023-09-01 LAB — COMPREHENSIVE METABOLIC PANEL WITH GFR
ALT: 12 U/L (ref 0–44)
AST: 15 U/L (ref 15–41)
Albumin: 2.9 g/dL — ABNORMAL LOW (ref 3.5–5.0)
Alkaline Phosphatase: 42 U/L (ref 38–126)
Anion gap: 11 (ref 5–15)
BUN: 69 mg/dL — ABNORMAL HIGH (ref 6–20)
CO2: 20 mmol/L — ABNORMAL LOW (ref 22–32)
Calcium: 7.7 mg/dL — ABNORMAL LOW (ref 8.9–10.3)
Chloride: 108 mmol/L (ref 98–111)
Creatinine, Ser: 9.42 mg/dL — ABNORMAL HIGH (ref 0.61–1.24)
GFR, Estimated: 6 mL/min — ABNORMAL LOW (ref >60)
Glucose, Bld: 133 mg/dL — ABNORMAL HIGH (ref 70–99)
Potassium: 4.9 mmol/L (ref 3.5–5.1)
Sodium: 139 mmol/L (ref 135–145)
Total Bilirubin: 0.6 mg/dL (ref 0.0–1.2)
Total Protein: 5.9 g/dL — ABNORMAL LOW (ref 6.5–8.1)

## 2023-09-01 LAB — MAGNESIUM: Magnesium: 2.1 mg/dL (ref 1.7–2.4)

## 2023-09-01 MED ORDER — LACTULOSE 10 GM/15ML PO SOLN: 20.0000 g | Freq: Two times a day (BID) | ORAL | 0 refills | Status: AC | PRN

## 2023-09-01 MED ORDER — ACETAMINOPHEN 500 MG PO TABS: 1000.0000 mg | ORAL_TABLET | Freq: Four times a day (QID) | ORAL | Status: DC | PRN

## 2023-09-01 MED ORDER — OXYCODONE HCL 5 MG PO TABS: 5.0000 mg | ORAL_TABLET | ORAL | 0 refills | Status: DC | PRN

## 2023-09-01 MED ORDER — LIDOCAINE 5 % EX OINT: 1.0000 | TOPICAL_OINTMENT | Freq: Four times a day (QID) | CUTANEOUS | 0 refills | Status: DC | PRN

## 2023-09-01 NOTE — Discharge Summary (Signed)
 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 On arrival to the ED hemoglobin found to be 7.9 which was down from baseline.  Further downtrended to 7.2 and patient received 1 unit PRBC GI was consulted and performed colonoscopy 8/21 which revealed multiple actively bleeding hemorrhoids.  General surgery was consulted and took patient for hemorrhoidectomy on 8/21.  Postoperative course was uncomplicated.  Hemoglobin remained stable above 7.  By evaluation on 8/22 patient endorsed feeling ready for discharge home.  We talked about the importance of avoiding constipation in the near future and following up with all necessary specialists including gastroenterology for biopsy results, general surgery for postoperative follow-up, and nephrology for continued peritoneal dialysis teaching.   Bleeding hemorrhoids -8/21 hemorrhoidectomy x 3 internal bleeding hemorrhoids - General Surgery has provided postoperative recommendations.  Keep superficial dressings to the perianal area for drain management.  As needed pain control.  Avoid constipation - Status post 1 unit PRBC - Hgb stable now  Colonic polyps - Incidentally seen on colonoscopy - Follow pathology with gastroenterology outpatient   ESRD - Recently initiated on peritoneal dialysis training - Nephrology has been consulted.  At this time labs reveal no urgent need for dialysis.  They will  continue to follow outpatient as scheduled   Acute on chronic anemia - Status post 1 unit PRBC - Repeat hemoglobin stable above 7 --Cont Iron  at DC, refills available   Hypertension - Resume home meds  Discharge Instructions  Discharge Instructions     Call MD for:  difficulty breathing, headache or visual disturbances   Complete by: As directed    Call MD for:  persistant dizziness or light-headedness   Complete by: As directed    Call MD for:  persistant nausea and vomiting   Complete by: As directed    Call MD for:  severe uncontrolled pain   Complete by: As directed    Call MD for:  temperature >100.4   Complete by: As directed    Diet general   Complete by: As directed    Discharge instructions   Complete by: As directed    Follow up with your primary care physician to discuss the medication changes during this admission   Increase activity slowly   Complete by: As directed    No wound care   Complete by: As directed       Allergies as of 09/01/2023       Reactions   Hydralazine  Other (See Comments)   Flushing and tachycardia        Medication List     PAUSE taking these medications    sodium bicarbonate  650 MG tablet Wait to take this until your doctor or other care provider tells you to start again. Take 2 tablets (1,300 mg total) by mouth 3 (three) times daily.       STOP taking these medications    ciprofloxacin 250 MG tablet Commonly known as: CIPRO   furosemide  40 MG tablet Commonly known as: Lasix   patiromer  8.4 g packet Commonly known as: VELTASSA        TAKE these medications    acetaminophen  500 MG tablet Commonly known as: TYLENOL  Take 2 tablets (1,000 mg total) by mouth every 6 (six) hours as needed for mild pain (pain score 1-3).   amLODipine  10 MG tablet Commonly known as: NORVASC  Take 1 tablet (10 mg total) by mouth daily.   calcium  acetate 667 MG capsule Commonly known as: PHOSLO  Take 2 capsules (1,334 mg total) by  mouth 3 (three) times daily with meals.   cloNIDine  0.1 MG tablet Commonly known as: CATAPRES  Take 1 tablet (0.1 mg total) by mouth 2 (two) times daily.   iron  polysaccharides 150 MG capsule Commonly known as: NIFEREX Take 1 capsule (150 mg total) by mouth daily.   isosorbide  mononitrate 30 MG 24 hr tablet Commonly known as: IMDUR  Take 1 tablet (30 mg total) by mouth daily.   labetalol  200 MG tablet Commonly known as: NORMODYNE  Take 1 tablet (200 mg total) by mouth 2 (two) times daily.   lactulose  10 GM/15ML solution Commonly known as: CHRONULAC  Take 30 mLs (20 g total) by mouth 2 (two) times daily as needed for mild constipation. Take until having at least one easy bowel movement daily   lidocaine  5 % ointment Commonly known as: XYLOCAINE  Apply 1 Application topically 4 (four) times daily as needed for mild pain (pain score 1-3).   Lokelma  5 g packet Generic drug: sodium zirconium cyclosilicate  Take 10 g by mouth daily.   Lubricant Eye Drop 0.6 % Soln Generic drug: Propylene Glycol Place 1-2 drops into both eyes 3 (three) times daily as needed (dry/irritated eyes.).   oxyCODONE  5 MG immediate release tablet Commonly known as: Oxy IR/ROXICODONE  Take 1 tablet (5 mg total) by mouth every 4 (four) hours as needed for severe pain (pain score 7-10).   pantoprazole  40 MG tablet Commonly known as: PROTONIX  Take 1 tablet (40 mg total) by mouth daily.        Follow-up Information     Piscoya, Aloysius, MD. Schedule an appointment as soon as possible for a visit in 2 week(s).   Specialty: General Surgery Why: s/p hemorrhoidectomy Contact information: [address] [place] [address] [phone]                Allergies  Allergen Reactions   Hydralazine  Other (See Comments)    Flushing and tachycardia       Consultations: Treatment Team:  Desiderio Aloysius, MD   Procedures/Studies: DG Chest 1 View Result Date: 08/10/2023 EXAM: 1 VIEW  XRAY OF THE CHEST 08/10/2023 12:54:10 PM COMPARISON: 03/25/2020 radiograph and CT 03/22/2020. CLINICAL HISTORY: End stage chronic kidney disease (HCC). FINDINGS: LUNGS AND PLEURA: There is a 1 cm ill-defined opacity laterally in the right mid lung field. No pulmonary edema. No pleural effusion. No pneumothorax. HEART AND MEDIASTINUM: No acute abnormality of the cardiac and mediastinal silhouettes. BONES AND SOFT TISSUES: No acute osseous abnormality. Contrast material in the region of the gastric fundus. IMPRESSION: 1. 1 cm ill-defined opacity laterally in the right mid lung field. Electronically signed by: Katheleen Faes MD 08/10/2023 02:02 PM EDT RP Workstation: [id number]   MR ANGIO CHEST WO CONTRAST Result Date: 08/07/2023 CLINICAL DATA:  Thoracic aortic disease EXAM: MRA CHEST WITH OR WITHOUT CONTRAST TECHNIQUE: Angiographic images of the chest were obtained using MRA technique without intravenous contrast. CONTRAST:  None. COMPARISON:  CT angiogram of the chest performed March 21, 2020 FINDINGS: VASCULAR Aorta: Using  axial imaging, the tubular ascending thoracic aorta is estimated at 4.1 x 4.1 cm. The aortic arch is estimated at 3.8 by 4.2 cm. The proximal descending thoracic aorta is estimated at 2.8 cm. The distal descending thoracic aorta is estimated at 2.4 cm. Previously observed intramural hematoma involving the thoracic aorta is significantly improved. There is very minimal wall thickening of the left lateral wall of the descending thoracic aorta within estimated thickness of 4 mm at that site, previously measuring in excess of 7 mm. Heart: Enlarged heart, no pericardial effusion. Pulmonary Arteries:  Within normal limits. Other: Polycystic kidneys.  Small liver cysts. NON-VASCULAR Spinal cord: Nothing significant. Brachial plexus: Nothing significant. Muscles and tendons: Nothing significant. Bones: Nothing significant. Joints: Nothing significant. IMPRESSION: 1. Evolution of previously observed aortic  findings. The tubular ascending thoracic aorta is estimated at 4.1 cm in diameter. The aortic arch is estimated at 4.2 cm. The proximal descending thoracic aorta returns to a normal caliber. There is some residual wall thickening in the mid descending thoracic aorta estimated at 4 mm in greatest thickness (previously 7 mm). Recommend semi-annual imaging followup by CTA or MRA and referral to cardiothoracic surgery if not already obtained. This recommendation follows 2010 ACCF/AHA/AATS/ACR/ASA/SCA/SCAI/SIR/STS/SVM Guidelines for the Diagnosis and Management of Patients With Thoracic Aortic Disease. Circulation. 2010; 121: Z733-z63. Aortic aneurysm NOS (ICD10-I71.9) Electronically Signed   By: Maude Naegeli M.D.   On: 08/07/2023 08:27      Discharge Exam: Vitals:   09/01/23 0301 09/01/23 0839  BP: 139/86 (!) 153/85  Pulse: 80 73  Resp: 16 19  Temp: 98.3 F (36.8 C) 98.5 F (36.9 C)  SpO2: 95% 97%   Vitals:   08/31/23 1645 08/31/23 1917 09/01/23 0301 09/01/23 0839  BP: (!) 184/97 (!) 148/90 139/86 (!) 153/85  Pulse: 66 83 80 73  Resp: 13 20 16 19   Temp: (!) 97.5 F (36.4 C) 98.4 F (36.9 C) 98.3 F (36.8 C) 98.5 F (36.9 C)  TempSrc:    Oral  SpO2: 97% 99% 95% 97%  Weight:      Height:        Constitutional:  Normal appearance. Non toxic-appearing.  HENT: Head Normocephalic and atraumatic.  Mucous membranes are moist.  Eyes:  Extraocular intact. Conjunctivae normal.  Cardiovascular: Rate and Rhythm: Normal rate and regular rhythm.  Pulmonary: Non labored, symmetric rise of chest wall.  Skin: warm and dry. not jaundiced.  Neurological: No focal deficit present. alert. Oriented.  Psychiatric: Mood and Affect congruent.    The results of significant diagnostics from this hospitalization (including imaging, microbiology, ancillary and laboratory) are listed below for reference.     Microbiology: No results found for this or any previous visit (from the past 240 hours).    Labs: BNP (last 3 results) No results for input(s): BNP in the last 8760 hours. Basic Metabolic Panel: Recent Labs  Lab 08/30/23 0639 08/31/23 0335 09/01/23 0321  NA 146* 144 139  K 4.1 4.1 4.9  CL 110 111 108  CO2 24 21* 20*  GLUCOSE 101* 84 133*  BUN 86* 69* 69*  CREATININE 10.05* 9.18* 9.42*  CALCIUM  9.3 8.3* 7.7*  MG  --   --  2.1  PHOS  --   --  5.0*   Liver Function Tests: Recent Labs  Lab 08/30/23 0639 09/01/23 0321  AST 22 15  ALT 15 12  ALKPHOS 49 42  BILITOT 0.4 0.6  PROT 6.9 5.9*  ALBUMIN 3.4* 2.9*   No results for  input(s): LIPASE, AMYLASE in the last 168 hours. No results for input(s): AMMONIA in the last 168 hours. CBC: Recent Labs  Lab 08/30/23 0639 08/30/23 1144 08/30/23 2106 08/30/23 2309 08/31/23 0335 09/01/23 0321  WBC 5.1  --   --   --  3.7* 5.3  NEUTROABS 3.1  --   --   --   --  4.7  HGB 7.9* 7.2* 8.4* 8.5* 8.5* 7.8*  HCT 25.1* 22.5* 25.9* 25.6* 26.2* 24.0*  MCV 91.9  --   --   --  90.7 90.2  PLT 181  --   --   --  165 135*   Cardiac Enzymes: No results for input(s): CKTOTAL, CKMB, CKMBINDEX, TROPONINI in the last 168 hours. BNP: Invalid input(s): POCBNP CBG: No results for input(s): GLUCAP in the last 168 hours. D-Dimer No results for input(s): DDIMER in the last 72 hours. Hgb A1c No results for input(s): HGBA1C in the last 72 hours. Lipid Profile No results for input(s): CHOL, HDL, LDLCALC, TRIG, CHOLHDL, LDLDIRECT in the last 72 hours. Thyroid function studies No results for input(s): TSH, T4TOTAL, T3FREE, THYROIDAB in the last 72 hours.  Invalid input(s): FREET3 Anemia work up Recent Labs    08/30/23 0639  RETICCTPCT 1.7   Urinalysis    Component Value Date/Time   COLORURINE COLORLESS (A) 06/03/2023 1055   APPEARANCEUR CLEAR (A) 06/03/2023 1055   LABSPEC 1.009 06/03/2023 1055   PHURINE 6.0 06/03/2023 1055   GLUCOSEU 150 (A) 06/03/2023 1055   HGBUR NEGATIVE  06/03/2023 1055   BILIRUBINUR NEGATIVE 06/03/2023 1055   KETONESUR NEGATIVE 06/03/2023 1055   PROTEINUR NEGATIVE 06/03/2023 1055   NITRITE NEGATIVE 06/03/2023 1055   LEUKOCYTESUR NEGATIVE 06/03/2023 1055   Sepsis Labs Recent Labs  Lab 08/30/23 0639 08/31/23 0335 09/01/23 0321  WBC 5.1 3.7* 5.3   Microbiology No results found for this or any previous visit (from the past 240 hours).   Time coordinating discharge: 32 min   SIGNED: Brazen Domangue, DO [facility] 09/01/2023, 1:23 PM Pager   If 7PM-7AM, please contact night-coverage

## 2023-09-01 NOTE — Progress Notes (Signed)
 [facility]  ROUNDING NOTE   Subjective:  Buford Bremer is a 58 year old male with past medical conditions including anemia, hypertension, aortic arthrosclerosis, and polycystic disease resulting in end stage kidney disease requiring PD.  Patient presents to ED with rectal bleeding.  Patient now admitted for Rectal bleeding [K62.5] Lower GI bleed [K92.2]    Patient is known to our practice and is followed outpatient by Dr. Dennise. He is currently undergoing PD training at [facility]. He states rectal bleeding began the night prior to ED arrival. Patient received colonoscopy on 8/21 and was found to have hemorrhoids.   Labs on ED arrival concerning for HGB 7.9, decreased to 7.2. He did receive 1 unit blood transfusion during this admission.   We have been consulted to monitor renal function.   Objective:  Vital signs in last 24 hours:  Temp:  [97.5 F (36.4 C)-98.5 F (36.9 C)] 98.5 F (36.9 C) (08/22 0839) Pulse Rate:  [66-83] 73 (08/22 0839) Resp:  [13-20] 19 (08/22 0839) BP: (139-184)/(85-97) 153/85 (08/22 0839) SpO2:  [95 %-100 %] 97 % (08/22 0839)  Weight change:  Filed Weights   08/30/23 0634  Weight: 64.9 kg    Intake/Output: I/O last 3 completed shifts: In: 1060 [P.O.:100; I.V.:760; IV Piggyback:200] Out: 700 [Urine:600; Blood:100]   Intake/Output this shift:  Total I/O In: 120 [P.O.:120] Out: -   Physical Exam: General: NAD  Head: Normocephalic, atraumatic  Eyes: Anicteric  Lungs:  Clear, room air  Heart: Regular rate and rhythm   Abdomen:  Mild distention, soft, lap sites  Extremities:  No peripheral edema.  Neurologic: Alert  Skin: No rashes  Access: PD tenckhoff    Basic Metabolic Panel: Recent Labs  Lab 08/30/23 0639 08/31/23 0335 09/01/23 0321  NA 146* 144 139  K 4.1 4.1 4.9  CL 110 111 108  CO2 24 21* 20*  GLUCOSE 101* 84 133*  BUN 86* 69* 69*  CREATININE 10.05* 9.18* 9.42*  CALCIUM  9.3 8.3* 7.7*  MG  --   --  2.1  PHOS   --   --  5.0*    Liver Function Tests: Recent Labs  Lab 08/30/23 0639 09/01/23 0321  AST 22 15  ALT 15 12  ALKPHOS 49 42  BILITOT 0.4 0.6  PROT 6.9 5.9*  ALBUMIN 3.4* 2.9*   No results for input(s): LIPASE, AMYLASE in the last 168 hours. No results for input(s): AMMONIA in the last 168 hours.  CBC: Recent Labs  Lab 08/30/23 0639 08/30/23 1144 08/30/23 2106 08/30/23 2309 08/31/23 0335 09/01/23 0321  WBC 5.1  --   --   --  3.7* 5.3  NEUTROABS 3.1  --   --   --   --  4.7  HGB 7.9* 7.2* 8.4* 8.5* 8.5* 7.8*  HCT 25.1* 22.5* 25.9* 25.6* 26.2* 24.0*  MCV 91.9  --   --   --  90.7 90.2  PLT 181  --   --   --  165 135*    Cardiac Enzymes: No results for input(s): CKTOTAL, CKMB, CKMBINDEX, TROPONINI in the last 168 hours.  BNP: Invalid input(s): POCBNP  CBG: No results for input(s): GLUCAP in the last 168 hours.   Microbiology: Results for orders placed or performed during the hospital encounter of 03/21/20  Resp Panel by RT-PCR (Flu A&B, Covid) Nasopharyngeal Swab     Status: None   Collection Time: 03/21/20 11:32 PM   Specimen: Nasopharyngeal Swab; Nasopharyngeal(NP) swabs in vial transport medium  Result Value Ref Range Status   SARS Coronavirus 2 by RT PCR NEGATIVE NEGATIVE Final    Comment: (NOTE) SARS-CoV-2 target nucleic acids are NOT DETECTED.  The SARS-CoV-2 RNA is generally detectable in upper respiratory specimens during the acute phase of infection. The lowest concentration of SARS-CoV-2 viral copies this assay can detect is 138 copies/mL. A negative result does not preclude SARS-Cov-2 infection and should not be used as the sole basis for treatment or other patient management decisions. A negative result may occur with  improper specimen collection/handling, submission of specimen other than nasopharyngeal swab, presence of viral mutation(s) within the areas targeted by this assay, and inadequate number of viral copies(<138  copies/mL). A negative result must be combined with clinical observations, patient history, and epidemiological information. The expected result is Negative.  Fact Sheet for Patients:  BloggerCourse.com  Fact Sheet for Healthcare Providers:  SeriousBroker.it  This test is no t yet approved or cleared by the [place]  FDA and  has been authorized for detection and/or diagnosis of SARS-CoV-2 by FDA under an Emergency Use Authorization (EUA). This EUA will remain  in effect (meaning this test can be used) for the duration of the COVID-19 declaration under Section 564(b)(1) of the Act, 21 U.S.C.section 360bbb-3(b)(1), unless the authorization is terminated  or revoked sooner.       Influenza A by PCR NEGATIVE NEGATIVE Final   Influenza B by PCR NEGATIVE NEGATIVE Final    Comment: (NOTE) The Xpert Xpress SARS-CoV-2/FLU/RSV plus assay is intended as an aid in the diagnosis of influenza from Nasopharyngeal swab specimens and should not be used as a sole basis for treatment. Nasal washings and aspirates are unacceptable for Xpert Xpress SARS-CoV-2/FLU/RSV testing.  Fact Sheet for Patients: BloggerCourse.com  Fact Sheet for Healthcare Providers: SeriousBroker.it  This test is not yet approved or cleared by the [place]  FDA and has been authorized for detection and/or diagnosis of SARS-CoV-2 by FDA under an Emergency Use Authorization (EUA). This EUA will remain in effect (meaning this test can be used) for the duration of the COVID-19 declaration under Section 564(b)(1) of the Act, 21 U.S.C. section 360bbb-3(b)(1), unless the authorization is terminated or revoked.  Performed at [facility], [address] [place], [place] [address]   MRSA PCR Screening     Status: None   Collection Time: 03/22/20  6:18 AM   Specimen: Nasopharyngeal  Result Value Ref Range  Status   MRSA by PCR NEGATIVE NEGATIVE Final    Comment:        The GeneXpert MRSA Assay (FDA approved for NASAL specimens only), is one component of a comprehensive MRSA colonization surveillance program. It is not intended to diagnose MRSA infection nor to guide or monitor treatment for MRSA infections. Performed at [facility], [address] [place], [place] [address]   Urine Culture     Status: Abnormal   Collection Time: 03/23/20 11:44 PM   Specimen: Urine, Clean Catch  Result Value Ref Range Status   Specimen Description   Final    URINE, CLEAN CATCH Performed at [facility], [address], [place], [place] [address]    Special Requests   Final    NONE Performed at [facility], [address] [place], [place] [address]    Culture (A)  Final    10,000 COLONIES/mL GROUP B STREP(S.AGALACTIAE)ISOLATED TESTING AGAINST S. AGALACTIAE NOT ROUTINELY PERFORMED DUE TO PREDICTABILITY OF AMP/PEN/VAN SUSCEPTIBILITY. Performed at [facility], [address] [place], [place] [address]    Report Status 03/25/2020 FINAL  Final    Coagulation Studies: Recent Labs    08/30/23 0736  LABPROT 14.5  INR 1.1    Urinalysis: No results for input(s): COLORURINE, LABSPEC, PHURINE, GLUCOSEU, HGBUR, BILIRUBINUR, KETONESUR, PROTEINUR, UROBILINOGEN, NITRITE, LEUKOCYTESUR in the last 72 hours.  Invalid input(s): APPERANCEUR    Imaging: No results found.   Medications:     sodium chloride    Intravenous Once   acetaminophen   1,000 mg Oral Q6H   amLODipine   10 mg Oral Daily   cloNIDine   0.1 mg Oral BID   isosorbide  mononitrate  30 mg Oral Daily   labetalol   200 mg Oral BID   pantoprazole   40 mg Oral Daily   polyethylene glycol  17 g Oral Daily   HYDROmorphone  (DILAUDID ) injection, labetalol , nitroGLYCERIN , ondansetron  **OR** ondansetron  (ZOFRAN ) IV, oxyCODONE   Assessment/ Plan:  Mr. Arnulfo Batson is a 58 y.o.   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  Patient presents to the hospital for scheduled surgical procedure for PD catheter placement and was found to have an elevated potassium level.  Patient now admitted for Right inguinal hernia [K40.90] Umbilical hernia without obstruction and without gangrene [K42.9] End stage renal disease (HCC) [N18.6] Hyperkalemia [E87.5]     1. Anemia of chronic kidney disease with acute blood loss Hemoglobin & Hematocrit     Component Value Date/Time   HGB 7.8 (L) 09/01/2023 0321   HGB 7.9 (L) 06/01/2023 1119   HCT 24.0 (L) 09/01/2023 0321   HCT 24.6 (L) 06/01/2023 1119  Reports of rectal bleeding on admission, colonoscopy showed hemorrhoids. Patient received blood transfusion.   2. End stage renal disease on peritoneal dialysis. Currently receiving training for PD. Labs and patient stable. Will defer treatments during this admission unless patient becomes unstable. Next training scheduled for Monday.    3. Secondary Hyperparathyroidism: with outpatient labs: PTH 89, phosphorus 5.9, calcium  10.7 on 06/29/23.   Calcium  decreased but phos acceptable.    4.  Hypertension with chronic kidney disease.  Currently receiving amlodipine , clonidine , furosemide , isosorbide , and labetalol .   LOS: 1 Rakeya Glab 8/22/20251:02 PM

## 2023-09-01 NOTE — Plan of Care (Signed)
  Problem: Education: Goal: Knowledge of General Education information will improve Description: Including pain rating scale, medication(s)/side effects and non-pharmacologic comfort measures Outcome: Progressing   Problem: Health Behavior/Discharge Planning: Goal: Ability to manage health-related needs will improve Outcome: Progressing   Problem: Clinical Measurements: Goal: Ability to maintain clinical measurements within normal limits will improve Outcome: Progressing   Problem: Clinical Measurements: Goal: Will remain free from infection Outcome: Progressing   Problem: Clinical Measurements: Goal: Diagnostic test results will improve Outcome: Progressing   Problem: Pain Managment: Goal: General experience of comfort will improve and/or be controlled Outcome: Progressing

## 2023-09-01 NOTE — Progress Notes (Signed)
 IV removed. Discharge instructions reviewed with the patient. Patient will be pushed by wheelchair to his car. He is driving himself home

## 2023-09-01 NOTE — Discharge Instructions (Signed)
 Discharge Instructions: 1.  Patient may shower, but do not scrub wounds heavily and dab dry only. 2.  Please do Sitz baths twice daily and as needed after bowel movements. 3.  May apply fluffed gauze over the perianal area for padding/comfort. 4.  May start using the Lidocaine  ointment on 09/03/23.  Please do not use it before that. 5.  There is a dissolvable gauze in the anal canal that was placed during surgery.  You may pass that when having flatus or bowel movement.  Once it is out, please discard. 6.  May use baby wipes to wipe after bowel movements. 7.  Please start taking Miralax  once daily to help with constipation/bowel movements. 8.  Please start taking Benefiber or Metamucil one serving daily to help with constipation/bowel movements.  Will also have to drink a bit more fluids.

## 2023-09-01 NOTE — Plan of Care (Signed)

## 2023-09-01 NOTE — Progress Notes (Signed)
 The patient had a colonoscopy yesterday with a finding of 2 polyps and bleeding hemorrhoids.  The patient was taken to surgery for the bleeding hemorrhoids yesterday.  Nothing further to do from a GI point of view.  I will sign off.  Please call if any further GI concerns or questions.  We would like to thank you for the opportunity to participate in the care of Trevor Meyer.

## 2023-09-01 NOTE — Progress Notes (Signed)
 [facility] SURGICAL PROGRESS NOTE  Hospital Day(s): 1.   Post op day(s): 1 Day Post-Op.   Interval History:  Patient seen and examined No acute events or new complaints overnight.  Patient reports he is doing well; sore expectedly Hgb 7.8 (from 8.5) Renal function baseline with known renal disease He is on renal diet  Vital signs in last 24 hours: [min-max] current  Temp:  [97.5 F (36.4 C)-98.6 F (37 C)] 98.3 F (36.8 C) (08/22 0301) Pulse Rate:  [65-88] 80 (08/22 0301) Resp:  [13-20] 16 (08/22 0301) BP: (111-184)/(74-97) 139/86 (08/22 0301) SpO2:  [95 %-100 %] 95 % (08/22 0301)     Height: 5' 6 (167.6 cm) Weight: 64.9 kg BMI (Calculated): 23.09   Intake/Output last 2 shifts:  08/21 0701 - 08/22 0700 In: 1060 [P.O.:100; I.V.:760; IV Piggyback:200] Out: 700 [Urine:600; Blood:100]   Physical Exam:  Constitutional: alert, cooperative and no distress  Respiratory: breathing non-labored at rest  Cardiovascular: regular rate and sinus rhythm  Genitourinary: No active bleeding to hemorrhoidectomy sites, expected drainage, no erythema   Labs:     Latest Ref Rng & Units 09/01/2023    3:21 AM 08/31/2023    3:35 AM 08/30/2023   11:09 PM  CBC  WBC 4.0 - 10.5 K/uL 5.3  3.7    Hemoglobin 13.0 - 17.0 g/dL 7.8  8.5  8.5   Hematocrit 39.0 - 52.0 % 24.0  26.2  25.6   Platelets 150 - 400 K/uL 135  165        Latest Ref Rng & Units 09/01/2023    3:21 AM 08/31/2023    3:35 AM 08/30/2023    6:39 AM  CMP  Glucose 70 - 99 mg/dL 866  84  898   BUN 6 - 20 mg/dL 69  69  86   Creatinine 0.61 - 1.24 mg/dL 0.57  0.81  89.94   Sodium 135 - 145 mmol/L 139  144  146   Potassium 3.5 - 5.1 mmol/L 4.9  4.1  4.1   Chloride 98 - 111 mmol/L 108  111  110   CO2 22 - 32 mmol/L 20  21  24    Calcium  8.9 - 10.3 mg/dL 7.7  8.3  9.3   Total Protein 6.5 - 8.1 g/dL 5.9   6.9   Total Bilirubin 0.0 - 1.2 mg/dL 0.6   0.4   Alkaline Phos 38 - 126 U/L 42   49   AST 15 - 41 U/L 15   22    ALT 0 - 44 U/L 12   15      Imaging studies: No new pertinent imaging studies   Assessment/Plan: 58 y.o. male 1 Day Post-Op s/p hemorrhoidectomy x3 for bleeding internal hemorrhoids    - Okay to continue diet as tolerated   - Can utilize superficial dressings to the peri-anal area for drainage management  - Monitor H&H; changes likely secondary to EBL intra-operatively.  - Pain control prn   - Discharge Planning: Okay for discharge from surgical perspective. We will plan to follow up in ~2 weeks   All of the above findings and recommendations were discussed with the patient, and the medical team, and all of patient's questions were answered to his expressed satisfaction.  -- Arthea Platt, PA-C [facility] 09/01/2023, 7:35 AM M-F: 7am - 4pm

## 2023-09-01 NOTE — Anesthesia Postprocedure Evaluation (Signed)
 Anesthesia Post Note  Patient: Trevor Meyer  Procedure(s) Performed: COLONOSCOPY POLYPECTOMY, INTESTINE CONTROL OF HEMORRHAGE, GI TRACT, ENDOSCOPIC, BY CLIPPING OR OVERSEWING  Patient location during evaluation: PACU Anesthesia Type: General Level of consciousness: awake and alert Pain management: pain level controlled Vital Signs Assessment: post-procedure vital signs reviewed and stable Respiratory status: spontaneous breathing, nonlabored ventilation and respiratory function stable Cardiovascular status: blood pressure returned to baseline and stable Postop Assessment: no apparent nausea or vomiting Anesthetic complications: no   No notable events documented.   Last Vitals:  Vitals:   09/01/23 0301 09/01/23 0839  BP: 139/86 (!) 153/85  Pulse: 80 73  Resp: 16 19  Temp: 36.8 C 36.9 C  SpO2: 95% 97%    Last Pain:  Vitals:   09/01/23 0839  TempSrc: Oral  PainSc:                  [name]

## 2023-09-04 ENCOUNTER — Ambulatory Visit

## 2023-09-04 ENCOUNTER — Inpatient Hospital Stay

## 2023-09-04 ENCOUNTER — Ambulatory Visit: Payer: Self-pay | Admitting: Gastroenterology

## 2023-09-04 LAB — SURGICAL PATHOLOGY

## 2023-09-05 ENCOUNTER — Ambulatory Visit: Admitting: Physician Assistant

## 2023-09-05 NOTE — Telephone Encounter (Signed)
 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 Phone call to patient's spouse Mrs Sallyann, [place] answered call stating her husband is not home and requested to try to contact him by text message. Text message sent to patient asking him to contact [facility], [facility] TB clinic nurse at [phone].  Almarie Metro, RN

## 2023-09-06 NOTE — Telephone Encounter (Signed)
 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 No response from patient to phone calls or text message. Letter mailed. Almarie Metro, RN

## 2023-09-08 ENCOUNTER — Telehealth: Payer: Self-pay | Admitting: Physician Assistant

## 2023-09-08 NOTE — Telephone Encounter (Signed)
 Did not leave a number to call back. Pt has not had the pneumonia vaccine.  KP

## 2023-09-08 NOTE — Telephone Encounter (Signed)
 Copied from CRM [id number]. Topic: Clinical - Medical Advice >> Sep 08, 2023 12:02 PM Donna BRAVO wrote: Reason for CRM: patient is at the dialysis center now, nurse is asking if patient has had pneumonia vaccine and brand. Nurse Rhoanna   Clinical Representative called with patient on the line requesting information regarding immunization history, specifically the Pneumococcal vaccine.

## 2023-09-15 ENCOUNTER — Ambulatory Visit (INDEPENDENT_AMBULATORY_CARE_PROVIDER_SITE_OTHER): Admitting: Surgery

## 2023-09-15 ENCOUNTER — Encounter: Payer: Self-pay | Admitting: Surgery

## 2023-09-15 VITALS — BP 157/93 | HR 80 | Temp 98.3°F | Ht 66.0 in | Wt 138.4 lb

## 2023-09-15 DIAGNOSIS — K648 Other hemorrhoids: Secondary | ICD-10-CM

## 2023-09-15 DIAGNOSIS — Z992 Dependence on renal dialysis: Secondary | ICD-10-CM

## 2023-09-15 DIAGNOSIS — K429 Umbilical hernia without obstruction or gangrene: Secondary | ICD-10-CM

## 2023-09-15 DIAGNOSIS — Z09 Encounter for follow-up examination after completed treatment for conditions other than malignant neoplasm: Secondary | ICD-10-CM

## 2023-09-15 DIAGNOSIS — N185 Chronic kidney disease, stage 5: Secondary | ICD-10-CM

## 2023-09-15 DIAGNOSIS — K4090 Unilateral inguinal hernia, without obstruction or gangrene, not specified as recurrent: Secondary | ICD-10-CM

## 2023-09-15 DIAGNOSIS — N186 End stage renal disease: Secondary | ICD-10-CM

## 2023-09-15 MED ORDER — LIDOCAINE 5 % EX OINT: 1.0000 | TOPICAL_OINTMENT | Freq: Four times a day (QID) | CUTANEOUS | 0 refills | Status: DC | PRN

## 2023-09-15 NOTE — Progress Notes (Signed)
 09/15/2023  HPI: Esmond Hinch is a 58 y.o. male s/p Exam under Anesthesia and hemorrhoidectomy of 3 columns on 08/31/23 for bleeding internal hemorrhoids.  Patient presents for follow up.  He reports he's doing well.  There's still some discomfort with bowel movements and also some blood that he notices when wiping.  However, no episodes of bleeding like before surgery.  Symptoms are improving.  Doing well from abdominal standpoint as well and peritoneal dialysis is going well.  Vital signs: BP (!) 157/93   Pulse 80   Temp 98.3 F (36.8 C) (Oral)   Ht 5' 6 (1.676 m)   Wt 138 lb 6.4 oz (62.8 kg)   SpO2 99%   BMI 22.34 kg/m    Physical Exam: Constitutional:  No acute distress Abdomen:  Soft, non-distended, non-tender.  Incisions well healed and without any hernia recurrence. Rectal:  External exam reveals healing tissue from excised right posterior external component.  No bleeding noted.  DRE deferred.  Assessment/Plan: This is a 58 y.o. male s/p EUA and hemorrhoidectomy  --Patient is recovering well after surgery.  No further bleeding episodes.  Discussed that the discomfort and the blood on the toilet paper will continue to improve as the tissues heal. --Will refill lidocaine  ointment per request --Follow up as needed.   Aloysius Sheree Plant, MD [facility]

## 2023-09-15 NOTE — Patient Instructions (Signed)
 Surgical Procedures for Hemorrhoids, Care After After surgery for hemorrhoids, it is common to have: Pain in your rectum for 2-4 weeks after the procedure, and may take 1-2 months to fully recover. Pain when you are pooping. A small amount of bleeding from your rectum. This is more likely to happen the first time you poop after surgery. Follow these instructions at home: Medicines Take over-the-counter and prescription medicines only as told by your health care provider. If you were prescribed antibiotics, use them as told by your provider. Do not stop using the antibiotic even if you start to feel better. Ask your provider if the medicine prescribed to you requires you to avoid driving or using machinery. Use a stool softener or a medicine that helps you poop (laxative) as told by your provider. Eating and drinking Follow instructions from your provider about what you may eat and drink after the surgery. You may need to take these actions to prevent or treat constipation: Drink enough fluid to keep your pee (urine) pale yellow. Take over-the-counter or prescription medicines. Eat foods that are high in fiber, such as beans, whole grains, and fresh fruits and vegetables. Limit foods that are high in fat and processed sugars, such as fried or sweet foods. Managing pain and swelling  Take warm sitz baths for 15-20 minutes, 2-3 times a day to ease soreness and itching. This will also help keep the rectal area clean. If told, put ice on the affected area. It may help to use ice packs between sitz baths. Put ice in a plastic bag. Place a towel between your skin and the bag. Leave the ice on for 20 minutes, 2-3 times a day. If your skin turns bright red, remove the ice right away to prevent skin damage. The risk of damage is higher if you cannot feel pain, heat, or cold. Activity Rest as told by your provider. Do not sit for a long time without moving. Get up to take short walks every 1-2  hours. This will improve blood flow and breathing. Ask for help if you feel weak or unsteady. You may have to avoid lifting. Ask your provider how much you can safely lift. Return to your normal activities as told by your provider. Ask your provider what activities are safe for you. General instructions Do not strain to poop. Do not spend a long time sitting on the toilet. If you were given a sedative during the surgery, it can affect you for several hours. Do not drive or operate machinery until your provider says that it is safe. Your provider may give you more instructions. Make sure you know what you can and cannot do. Contact a health care provider if: Your pain medicine is not helping. You have a fever or chills. You have drainage that smells bad. You have a lot of swelling. You become constipated. You have trouble peeing (urinating). You have very bad pain in your rectum. Get help right away if: You are bleeding a lot from your rectum. This information is not intended to replace advice given to you by your health care provider. Make sure you discuss any questions you have with your health care provider. Document Revised: 08/20/2021 Document Reviewed: 08/20/2021 Elsevier Patient Education  2024 [employer].

## 2023-09-18 ENCOUNTER — Telehealth: Payer: Self-pay | Admitting: Surgery

## 2023-09-18 DIAGNOSIS — R7612 Nonspecific reaction to cell mediated immunity measurement of gamma interferon antigen response without active tuberculosis: Secondary | ICD-10-CM | POA: Insufficient documentation

## 2023-09-18 NOTE — Telephone Encounter (Signed)
 Pt said that he went to the ph[facility] on [address] to get his cream and that said they did not have the rx. I seen where we got a conformation that they received it. [facility] call [facility] on  [address] and place it again. Pt # [phone]

## 2023-09-18 NOTE — Progress Notes (Signed)
 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 [facility] Department for follow-up on 09/04/2023. No response from patient to phone calls, text message or letter. Home visit today, no answer. Phone call to nephrology provider, at [phone], message left for Dr. Dennise Capri with clinic nurse, reporting that this case will be closed to follow-up until patient contact [place] TB clinic for further screening and recommendations. TB coordinator contact number [phone]. Almarie Metro, RN

## 2023-09-18 NOTE — Telephone Encounter (Signed)
 Lvm for the pt with information.

## 2023-10-01 ENCOUNTER — Other Ambulatory Visit: Payer: Self-pay

## 2023-10-01 ENCOUNTER — Emergency Department: Admission: EM | Admit: 2023-10-01 | Discharge: 2023-10-01 | Disposition: A | Discharge status: Home / Self Care

## 2023-10-01 ENCOUNTER — Emergency Department

## 2023-10-01 DIAGNOSIS — N186 End stage renal disease: Secondary | ICD-10-CM | POA: Diagnosis not present

## 2023-10-01 DIAGNOSIS — K5900 Constipation, unspecified: Secondary | ICD-10-CM | POA: Diagnosis present

## 2023-10-01 DIAGNOSIS — D631 Anemia in chronic kidney disease: Secondary | ICD-10-CM | POA: Diagnosis not present

## 2023-10-01 DIAGNOSIS — K648 Other hemorrhoids: Secondary | ICD-10-CM | POA: Insufficient documentation

## 2023-10-01 DIAGNOSIS — I120 Hypertensive chronic kidney disease with stage 5 chronic kidney disease or end stage renal disease: Secondary | ICD-10-CM | POA: Diagnosis not present

## 2023-10-01 DIAGNOSIS — Z992 Dependence on renal dialysis: Secondary | ICD-10-CM | POA: Diagnosis not present

## 2023-10-01 LAB — CBC WITH DIFFERENTIAL/PLATELET
Abs Immature Granulocytes: 0.02 K/uL (ref 0.00–0.07)
Basophils Absolute: 0 K/uL (ref 0.0–0.1)
Basophils Relative: 0 %
Eosinophils Absolute: 0.1 K/uL (ref 0.0–0.5)
Eosinophils Relative: 2 %
HCT: 29.4 % — ABNORMAL LOW (ref 39.0–52.0)
Hemoglobin: 9.8 g/dL — ABNORMAL LOW (ref 13.0–17.0)
Immature Granulocytes: 0 %
Lymphocytes Relative: 12 %
Lymphs Abs: 0.7 K/uL (ref 0.7–4.0)
MCH: 30.8 pg (ref 26.0–34.0)
MCHC: 33.3 g/dL (ref 30.0–36.0)
MCV: 92.5 fL (ref 80.0–100.0)
Monocytes Absolute: 0.3 K/uL (ref 0.1–1.0)
Monocytes Relative: 5 %
Neutro Abs: 4.7 K/uL (ref 1.7–7.7)
Neutrophils Relative %: 81 %
Platelets: 161 K/uL (ref 150–400)
RBC: 3.18 MIL/uL — ABNORMAL LOW (ref 4.22–5.81)
RDW: 14.6 % (ref 11.5–15.5)
WBC: 5.8 K/uL (ref 4.0–10.5)
nRBC: 0 % (ref 0.0–0.2)

## 2023-10-01 MED ORDER — MAGNESIUM CITRATE PO SOLN: 1.0000 | Freq: Once | ORAL | 0 refills | Status: AC

## 2023-10-01 MED ORDER — FLEET ENEMA RE ENEM
1.0000 | ENEMA | Freq: Once | RECTAL | Status: AC
  Administered 2023-10-01: 1 via RECTAL

## 2023-10-01 MED ORDER — BISACODYL 10 MG RE SUPP
10.0000 mg | Freq: Once | RECTAL | Status: AC
  Administered 2023-10-01: 10 mg via RECTAL
  Filled 2023-10-01: qty 1

## 2023-10-01 NOTE — Discharge Instructions (Addendum)
 Do not take the magnesium  citrate that was sent to your pharmacy.  Continue the MiraLax  2 times per day until you are having regular bowel movements.  Follow up with GI and your other specialists as scheduled.  Return to the ER for symptoms that change or worsen if you are unable to  schedule an appointment.

## 2023-10-01 NOTE — ED Notes (Signed)
 Patient had blood on briefs on exam by NP.

## 2023-10-01 NOTE — ED Notes (Signed)
 Per ED tech's report, patient had a large, formed stool with some blood noted.

## 2023-10-01 NOTE — ED Notes (Signed)
 Patient given ED stretch briefs and a pad and paper scrubs. Patient states that he would like staff to just throw them away.

## 2023-10-01 NOTE — ED Notes (Signed)
 Patient is on bedside commode. Patient states he is having a bowel movement. Patient given call bell and has wipes at bedside. Hilliard Gentry, NP aware.

## 2023-10-01 NOTE — ED Notes (Signed)
 Patient ambulated to hallway bathroom with a steady gait. Patient was shown cord to use if needed.

## 2023-10-01 NOTE — ED Provider Notes (Signed)
 [facility] Provider Note    Event Date/Time   First MD Initiated Contact with Patient 10/01/23 [phone]     (approximate)   History   Constipation   HPI  Trevor Meyer is a 58 y.o. male  with history of hypertension, CKD, ESRD, IDA and as listed in EMR presents to the emergency department for treatment and evaluation of constipation x 4 days. He feels something is stuck in the lower rectum that won't come out. No abdominal pain, nausea, or vomiting.    Physical Exam    Vitals:   10/01/23 0944  BP: (!) 143/95  Pulse: 97  Resp: 18  Temp: 97.8 F (36.6 C)  SpO2: 98%    General: Awake, no distress.  CV:  Good peripheral perfusion.  Resp:  Normal effort.  Abd:  No distention.  Other:  Soft stool ball palpable high in rectum. Internal hemorrhoid noted. No melena.   ED Results / Procedures / Treatments   Labs (all labs ordered are listed, but only abnormal results are displayed)  Labs Reviewed  CBC WITH DIFFERENTIAL/PLATELET - Abnormal; Notable for the following components:      Result Value   RBC 3.18 (*)    Hemoglobin 9.8 (*)    HCT 29.4 (*)    All other components within normal limits     EKG  Not indicated.   RADIOLOGY  Image and radiology report reviewed and interpreted by me. Radiology report consistent with the same.  1 view abdomen image shows nonobstructive bowel gas pattern  PROCEDURES:  Critical Care performed: No  Procedures   MEDICATIONS ORDERED IN ED:  Medications  bisacodyl  (DULCOLAX) suppository 10 mg (10 mg Rectal Given 10/01/23 1217)  sodium phosphate  (FLEET) enema 1 enema (1 enema Rectal Given 10/01/23 1517)     IMPRESSION / MDM / ASSESSMENT AND PLAN / ED COURSE   I have reviewed the triage note and vital signs. Vital signs are stable.   Differential diagnosis includes, but is not limited to, constipation, impaction, hemorrhoid, bowel obstruction  Patient's presentation is most consistent with acute  illness / injury with system symptoms.  58 year old male presenting to the emergency department for treatment and evaluation of constipation.  No bowel movement in the last 4 days.  Patient feels that there is something in the rectum that will not come out.  See HPI for further details.  Discharge summary from recent hospitalization dated September 01, 2023 reviewed.  He had been admitted for new onset rectal bleeding and found to be anemic with a hemoglobin of 7.9 then 7.2.  He received 1 unit of packed red blood cells.  Colonoscopy was performed which showed multiple actively bleeding hemorrhoids.  He had a hemorrhoidectomy on August 21.  Hemoglobin remained stable above 7 and he felt ready for discharge home.  He was advised of the importance of avoiding constipation and was encouraged to follow-up with specialist including GI.  Since the last admission, patient denies noting significant rectal bleeding but does state that he has blood streaks on toilet paper when wiping.  He has not had repeat labs.  On exam, he has soft stool high in the rectal vault. Suppository ordered.   No relief with suppository. Fleet ordered.  Clinical Course as of 10/01/23 1602  Sun Oct 01, 2023  1558 CBC today shows a stable anemia with a hemoglobin of 9.8.  Fleet enema was successful in producing a bowel movement.  Patient advised to continue MiraLAX  as  previously advised until bowel movements become regular.  He is to follow-up with his GI specialist. ER return precautions discussed. [CT]    Clinical Course User Index [CT] Carlei Huang B, FNP     FINAL CLINICAL IMPRESSION(S) / ED DIAGNOSES   Final diagnoses:  Constipation, unspecified constipation type     Rx / DC Orders   ED Discharge Orders          Ordered    magnesium  citrate SOLN   Once        10/01/23 1406             Note:  This document was prepared using Dragon voice recognition software and may include unintentional dictation  errors.   Herlinda Kirk NOVAK, FNP 10/01/23 1602    Nicholaus Rolland BRAVO, MD 10/01/23 [phone]

## 2023-10-01 NOTE — ED Triage Notes (Signed)
 Pt to ED via POV from home. Pt reports constipation x4 days. Pt reports some intermittent pain. No relief with at home remedies.

## 2023-10-01 NOTE — ED Notes (Signed)
 Family at bedside.

## 2023-10-01 NOTE — ED Notes (Signed)
 Patient states he was not able to have a bowel movement at this time. Kirk Landry Gentry, NP aware.

## 2023-10-02 ENCOUNTER — Telehealth: Payer: Self-pay

## 2023-10-02 NOTE — Transitions of Care (Post Inpatient/ED Visit) (Unsigned)
   10/02/2023  Name: Trevor Meyer MRN: [id number] DOB: [birth year]  Today's TOC FU Call Status:    Attempted to reach the patient regarding the most recent Inpatient/ED visit.  Follow Up Plan: Additional outreach attempts will be made to reach the patient to complete the Transitions of Care (Post Inpatient/ED visit) call.   [employer], CMA

## 2023-10-03 NOTE — Transitions of Care (Post Inpatient/ED Visit) (Unsigned)
   10/03/2023  Name: Trevor Meyer MRN: [id number] DOB: 07-21-1965  Today's TOC FU Call Status:    Attempted to reach the patient regarding the most recent Inpatient/ED visit.  Follow Up Plan: Additional outreach attempts will be made to reach the patient to complete the Transitions of Care (Post Inpatient/ED visit) call.   [employer], CMA

## 2023-10-04 ENCOUNTER — Encounter: Payer: Self-pay | Admitting: Nephrology

## 2023-10-04 NOTE — Transitions of Care (Post Inpatient/ED Visit) (Signed)
   10/04/2023  Name: Trevor Meyer MRN: [id number] DOB: 1965/12/18  Today's TOC FU Call Status: Today's TOC FU Call Status:: Unsuccessful Call (1st Attempt) Unsuccessful Call (1st Attempt) Date: 10/02/23 [facility] FU Call Complete Date: 10/03/23  Attempted to reach the patient regarding the most recent Inpatient/ED visit.  Follow Up Plan: No further outreach attempts will be made at this time. We have been unable to contact the patient.  [employer], CMA

## 2023-10-13 ENCOUNTER — Inpatient Hospital Stay: Attending: Oncology

## 2023-10-16 ENCOUNTER — Inpatient Hospital Stay

## 2023-10-16 ENCOUNTER — Encounter: Payer: Self-pay | Admitting: Oncology

## 2023-10-16 ENCOUNTER — Inpatient Hospital Stay: Admitting: Oncology

## 2023-12-04 ENCOUNTER — Ambulatory Visit
Admission: RE | Admit: 2023-12-04 | Discharge: 2023-12-04 | Disposition: A | Discharge status: Home / Self Care | Source: Ambulatory Visit | Attending: Nephrology | Admitting: Nephrology

## 2023-12-04 ENCOUNTER — Other Ambulatory Visit: Payer: Self-pay | Admitting: Nephrology

## 2023-12-04 ENCOUNTER — Ambulatory Visit
Admission: RE | Admit: 2023-12-04 | Discharge: 2023-12-04 | Disposition: A | Discharge status: Home / Self Care | Attending: Nephrology | Admitting: Nephrology

## 2023-12-04 DIAGNOSIS — T85611S Breakdown (mechanical) of intraperitoneal dialysis catheter, sequela: Secondary | ICD-10-CM

## 2023-12-04 DIAGNOSIS — N186 End stage renal disease: Secondary | ICD-10-CM | POA: Diagnosis present

## 2023-12-05 ENCOUNTER — Telehealth: Payer: Self-pay | Admitting: Surgery

## 2023-12-05 NOTE — Telephone Encounter (Signed)
 Patient has been advised of Pre-Admission date/time, and Surgery date at [facility].  Surgery Date: 12/12/23 Preadmission Testing Date: 12/06/23 (phone 1p-4p)  Patient informed of the scheduling process and surgery given at time of office visit.    Patient has been made aware to call [phone], between 1-3:00pm the day before surgery, to find out what time to arrive for surgery.

## 2023-12-06 ENCOUNTER — Encounter
Admission: RE | Admit: 2023-12-06 | Discharge: 2023-12-06 | Disposition: A | Discharge status: Home / Self Care | Source: Ambulatory Visit | Attending: Surgery | Admitting: Surgery

## 2023-12-06 ENCOUNTER — Other Ambulatory Visit: Payer: Self-pay

## 2023-12-06 VITALS — Ht 66.0 in | Wt 150.0 lb

## 2023-12-06 DIAGNOSIS — Z01818 Encounter for other preprocedural examination: Secondary | ICD-10-CM | POA: Insufficient documentation

## 2023-12-06 DIAGNOSIS — N185 Chronic kidney disease, stage 5: Secondary | ICD-10-CM

## 2023-12-06 NOTE — Patient Instructions (Addendum)
 Your procedure is scheduled on: TUESDAY 12/12/23 Report to the Registration Desk on the 1st floor of the [facility]. To find out your arrival time, please call [phone] between 1PM - 3PM on: MONDAY 12/11/23 If your arrival time is 6:00 am, do not arrive before that time as the [facility] entrance doors do not open until 6:00 am.  REMEMBER: Instructions that are not followed completely may result in serious medical risk, up to and including death; or upon the discretion of your surgeon and anesthesiologist your surgery may need to be rescheduled.  Do not eat food after midnight the night before surgery.  No gum chewing or hard candies.  You may however, drink CLEAR liquids up to 2 hours before you are scheduled to arrive for your surgery. Do not drink anything within 2 hours of your scheduled arrival time.  Clear liquids include: - water   - apple juice without pulp - gatorade (not RED colors) - black coffee or tea (Do NOT add milk or creamers to the coffee or tea) Do NOT drink anything that is not on this list.  One week prior to surgery: Stop Anti-inflammatories (NSAIDS) such as Advil, Aleve, Ibuprofen, Motrin, Naproxen, Naprosyn and Aspirin based products such as Excedrin, Goody's Powder, BC Powder.  Stop ANY OVER THE COUNTER supplements until after surgery.  You may however, continue to take Tylenol  if needed for pain up until the day of surgery.  Continue taking all of your other prescription medications up until the day of surgery.  ON THE DAY OF SURGERY ONLY TAKE THESE MEDICATIONS WITH SIPS OF WATER :  amLODipine  (NORVASC )  cloNIDine  (CATAPRES )  Labetalol  (NORMODYNE )  oxyCODONE  (OXY IR/ROXICODONE )  pantoprazole  (PROTONIX )  isosorbide  mononitrate (IMDUR )   Use inhalers on the day of surgery and bring to the hospital.  No Alcohol  for 24 hours before or after surgery.  No Smoking including e-cigarettes for 24 hours before surgery.  No chewable tobacco products for  at least 6 hours before surgery.  No nicotine patches on the day of surgery.  Do not use any recreational drugs for at least a week (preferably 2 weeks) before your surgery.  Please be advised that the combination of cocaine and anesthesia may have negative outcomes, up to and including death. If you test positive for cocaine, your surgery will be cancelled.  On the morning of surgery brush your teeth with toothpaste and water , you may rinse your mouth with mouthwash if you wish. Do not swallow any toothpaste or mouthwash.  Use CHG Soap or wipes as directed on instruction sheet.  Do not wear jewelry, make-up, hairpins, clips or nail polish.  For welded (permanent) jewelry: bracelets, anklets, waist bands, etc.  Please have this removed prior to surgery.  If it is not removed, there is a chance that hospital personnel will need to cut it off on the day of surgery.  Do not wear lotions, powders, or perfumes.   Do not shave body hair from the neck down 48 hours before surgery.  Contact lenses, hearing aids and dentures may not be worn into surgery.  Do not bring valuables to the hospital. [facility] is not responsible for any missing/lost belongings or valuables.   Bring your C-PAP to the hospital in case you may have to spend the night.   Notify your doctor if there is any change in your medical condition (cold, fever, infection).  Wear comfortable clothing (specific to your surgery type) to the hospital.  After surgery, you can  help prevent lung complications by doing breathing exercises.  Take deep breaths and cough every 1-2 hours. Your doctor may order a device called an Incentive Spirometer to help you take deep breaths. When coughing or sneezing, hold a pillow firmly against your incision with both hands. This is called "splinting." Doing this helps protect your incision. It also decreases belly discomfort.  If you are being discharged the day of surgery, you will not be  allowed to drive home. You will need a responsible individual to drive you home and stay with you for 24 hours after surgery.   If you are taking public transportation, you will need to have a responsible individual with you.  Please call the Pre-admissions Testing Dept. at [phone] if you have any questions about these instructions.  Surgery Visitation Policy:  Patients having surgery or a procedure may have two visitors.  Children under the age of 42 must have an adult with them who is not the patient.  Merchandiser, Retail to address health-related social needs:  [email or web address]                                                                                                            Preparing for Surgery with CHLORHEXIDINE  GLUCONATE (CHG) Soap  Chlorhexidine  Gluconate (CHG) Soap  o An antiseptic cleaner that kills germs and bonds with the skin to continue killing germs even after washing  o Used for showering the night before surgery and morning of surgery  Before surgery, you can play an important role by reducing the number of germs on your skin.  CHG (Chlorhexidine  gluconate) soap is an antiseptic cleanser which kills germs and bonds with the skin to continue killing germs even after washing.  Please do not use if you have an allergy to CHG or antibacterial soaps. If your skin becomes reddened/irritated stop using the CHG.  1. Shower the NIGHT BEFORE SURGERY with CHG soap.  2. If you choose to wash your hair, wash your hair first as usual with your normal shampoo.  3. After shampooing, rinse your hair and body thoroughly to remove the shampoo.  4. Use CHG as you would any other liquid soap. You can apply CHG directly to the skin and wash gently with a clean washcloth.  5. Apply the CHG soap to your body only from the neck down. Do not use on open wounds or open sores. Avoid contact with your eyes, ears, mouth, and genitals (private parts).  Wash face and genitals (private parts) with your normal soap.  6. Wash thoroughly, paying special attention to the area where your surgery will be performed.  7. Thoroughly rinse your body with warm water .  8. Do not shower/wash with your normal soap after using and rinsing off the CHG soap.  9. Do not use lotions, oils, etc., after showering with CHG.  10. Pat yourself dry with a clean towel.  11. Wear clean pajamas to bed the night before surgery.  12. Place clean sheets on your bed the night of your shower  and do not sleep with pets.  13. Do not apply any deodorants/lotions/powders.  14. Please wear clean clothes to the hospital.  15. Remember to brush your teeth with your regular toothpaste.

## 2023-12-11 ENCOUNTER — Ambulatory Visit: Admitting: Surgery

## 2023-12-11 ENCOUNTER — Encounter: Payer: Self-pay | Admitting: Surgery

## 2023-12-11 VITALS — BP 137/80 | HR 82 | Temp 98.9°F | Ht 66.0 in | Wt 149.0 lb

## 2023-12-11 DIAGNOSIS — T85611A Breakdown (mechanical) of intraperitoneal dialysis catheter, initial encounter: Secondary | ICD-10-CM

## 2023-12-11 MED ORDER — GABAPENTIN 300 MG PO CAPS
300.0000 mg | ORAL_CAPSULE | ORAL | Status: AC
  Administered 2023-12-12: 300 mg via ORAL

## 2023-12-11 MED ORDER — SODIUM CHLORIDE 0.9 % IV SOLN: INTRAVENOUS | Status: DC

## 2023-12-11 MED ORDER — CHLORHEXIDINE GLUCONATE CLOTH 2 % EX PADS
6.0000 | MEDICATED_PAD | Freq: Once | CUTANEOUS | Status: AC
  Administered 2023-12-12: 6 via TOPICAL

## 2023-12-11 MED ORDER — CEFAZOLIN SODIUM-DEXTROSE 2-4 GM/100ML-% IV SOLN
2.0000 g | INTRAVENOUS | Status: AC
  Administered 2023-12-12: 2 g via INTRAVENOUS

## 2023-12-11 MED ORDER — ORAL CARE MOUTH RINSE: 15.0000 mL | Freq: Once | OROMUCOSAL | Status: AC

## 2023-12-11 MED ORDER — CHLORHEXIDINE GLUCONATE 0.12 % MT SOLN
15.0000 mL | Freq: Once | OROMUCOSAL | Status: AC
  Administered 2023-12-12: 15 mL via OROMUCOSAL

## 2023-12-11 MED ORDER — ACETAMINOPHEN 500 MG PO TABS
1000.0000 mg | ORAL_TABLET | ORAL | Status: AC
  Administered 2023-12-12: 1000 mg via ORAL

## 2023-12-11 NOTE — H&P (View-Only) (Signed)
 12/11/2023  History of Present Illness: Trevor Meyer is a 58 y.o. male status post robotic assisted right inguinal hernia repair, laparoscopic peritoneal dialysis catheter placement, and open umbilical hernia repair on 08/11/2023.  Patient recently had issues with the PD catheter and KUB showed that the catheter had become displaced with the catheter being in the left lower quadrant rather than in the pelvis.  As such, he is scheduled for tomorrow 12/12/2023 for laparoscopic peritoneal dialysis catheter revision, with possible option of replacement.  The patient reports that he is otherwise been doing well and denies any abdominal pain, nausea, vomiting.  He reports feeling some soreness in the left lower quadrant at the site where the catheter is misplaced.  Denies any troubles with the incisions and denies any bulging or recurrence of his hernias.  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   a.) CTA chest 03/21/2020:  acute aortic syndrome with intramural hematoma extending from the LEFT subclavian artery origin to the diaphragmatic hiatus   CKD (chronic kidney disease), stage V (HCC)    Clotting disorder 2000   Hypertension 03/2020   Iron  deficiency anemia    Polycystic kidney disease    Right inguinal hernia    Secondary hyperparathyroidism of renal origin    Umbilical hernia      Past Surgical History: Past Surgical History:  Procedure Laterality Date   CAPD INSERTION N/A 08/11/2023   Procedure: LAPAROSCOPIC INSERTION CONTINUOUS AMBULATORY PERITONEAL DIALYSIS  (CAPD) CATHETER;  Surgeon: Desiderio Schanz, MD;  Location: [facility] ORS;  Service: General;  Laterality: N/A;   COLONOSCOPY N/A 08/31/2023   Procedure: COLONOSCOPY;  Surgeon: Jinny Carmine, MD;  Location: [facility] ENDOSCOPY;  Service: Endoscopy;  Laterality: N/A;   HEMORRHOID SURGERY N/A 08/31/2023   Procedure:  HEMORRHOIDECTOMY;  Surgeon: Desiderio Schanz, MD;  Location: [facility] ORS;  Service: General;  Laterality: N/A;   HEMOSTASIS CLIP PLACEMENT  08/31/2023   Procedure: CONTROL OF HEMORRHAGE, GI TRACT, ENDOSCOPIC, BY CLIPPING OR OVERSEWING;  Surgeon: Jinny Carmine, MD;  Location: [facility] ENDOSCOPY;  Service: Endoscopy;;   HERNIORRHAPHY, INGUINAL, ROBOT-ASSISTED, LAPAROSCOPIC Right 08/11/2023   Procedure: HERNIORRHAPHY, INGUINAL, ROBOT-ASSISTED, LAPAROSCOPIC;  Surgeon: Desiderio Schanz, MD;  Location: [facility] ORS;  Service: General;  Laterality: Right;   POLYPECTOMY  08/31/2023   Procedure: POLYPECTOMY, INTESTINE;  Surgeon: Jinny Carmine, MD;  Location: [facility] ENDOS[facility];  Service: Endoscopy;;   RECTAL EXAM UNDER ANESTHESIA N/A 08/31/2023   Procedure: EXAM UNDER ANESTHESIA, RECTUM;  Surgeon: Desiderio Schanz, MD;  Location: [facility] ORS;  Service: General;  Laterality: N/A;   UMBILICAL HERNIA REPAIR N/A 08/11/2023   Procedure: REPAIR, HERNIA, UMBILICAL, ADULT;  Surgeon: Desiderio Schanz, MD;  Location: [facility] ORS;  Service: General;  Laterality: N/A;    Home Medications: Prior to Admission medications   Medication Sig Start Date End Date Taking? Authorizing Provider  amLODipine  (NORVASC ) 10 MG tablet Take 1 tablet (10 mg total) by mouth daily. 03/27/20 07/21/27 Yes Maree Hue, MD  cloNIDine  (CATAPRES ) 0.1 MG tablet Take 1 tablet (0.1 mg total) by mouth 2 (two) times daily. 03/26/20  Yes Maree Hue, MD  isosorbide  mononitrate (IMDUR ) 30 MG 24 hr tablet Take 1 tablet (30 mg total) by mouth daily. 03/27/20 07/21/27 Yes Maree Hue, MD  labetalol  (NORMODYNE ) 200 MG tablet Take 1 tablet (200 mg total) by mouth 2 (two) times daily. 03/26/20 07/21/26 Yes Maree Hue, MD  lactulose  (CHRONULAC ) 10  GM/15ML solution Take 30 mLs (20 g total) by mouth 2 (two) times daily as needed for mild constipation. Take until having at least one easy bowel movement daily 09/01/23  Yes Dezii, Alexandra, DO  pantoprazole  (PROTONIX ) 40 MG tablet Take 1 tablet (40 mg total)  by mouth daily. 08/18/23 12/11/23 Yes Lemon Raisin, MD  Propylene Glycol (LUBRICANT EYE DROP) 0.6 % SOLN Place 1-2 drops into both eyes 3 (three) times daily as needed (dry/irritated eyes.).   Yes [provider]  sodium zirconium cyclosilicate  (LOKELMA ) 5 g packet Take 10 g by mouth daily. 08/12/23  Yes Caleen Qualia, MD    Allergies: Allergies  Allergen Reactions   Hydralazine  Other (See Comments)    Flushing and tachycardia       Review of Systems: Review of Systems  Constitutional:  Negative for chills and fever.  Respiratory:  Negative for shortness of breath.   Cardiovascular:  Negative for chest pain.  Gastrointestinal:  Positive for abdominal pain (discomfort left lower quadrant). Negative for nausea and vomiting.    Physical Exam BP 137/80   Pulse 82   Temp 98.9 F (37.2 C) (Oral)   Ht 5' 6 (1.676 m)   Wt 149 lb (67.6 kg)   SpO2 94%   BMI 24.05 kg/m  CONSTITUTIONAL: No acute distress, well-nourished. HEENT:  Normocephalic, atraumatic, extraocular motion intact. RESPIRATORY:  Lungs are clear, and breath sounds are equal bilaterally. Normal respiratory effort without pathologic use of accessory muscles. CARDIOVASCULAR: Heart is regular without murmurs, gallops, or rubs. GI: The abdomen is soft, nondistended, nontender to palpation.  Patient's incisions are well-healed.  Peritoneal dialysis catheter in place without any evidence of infection or tubing complications.  No evidence of hernia recurrence.  NEUROLOGIC:  Motor and sensation is grossly normal.  Cranial nerves are grossly intact. PSYCH:  Alert and oriented to person, place and time. Affect is normal.  Labs/Imaging: KUB on 11/30/2023: FINDINGS: Peritoneal dialysis catheter terminates in the left lower quadrant. Bowel gas pattern is unremarkable. Phleboliths in the right anatomic pelvis. Small right pleural effusion.   IMPRESSION: No acute findings in the abdomen.  Assessment and Plan: This is a 58  y.o. male with peritoneal dialysis catheter displacement.  - Discussed with patient the findings on his x-ray study.  The catheter has shifted to the left lower quadrant rather than being in the pelvis.  This could be as a result of adhesions or potentially of the bowels pushing the catheter out of place.  Discussed with him the plan for a laparoscopic assisted peritoneal dialysis catheter revision.  Discussed with him the planned incisions, risks of bleeding, infection, injury to surrounding structures, the possibility of having to replace the catheter, possibility of removing fibrin or clot formation within the catheter, that this would be an outpatient procedure, postoperative activity restrictions, pain control, and he is willing to proceed. - All of his questions have been answered.  I spent 30 minutes dedicated to the care of this patient on the date of this encounter to include pre-visit review of records, face-to-face time with the patient discussing diagnosis and management, and any post-visit coordination of care.   Aloysius Sheree Plant, MD [facility]

## 2023-12-11 NOTE — Patient Instructions (Signed)
 Surgery to Put in a Peritoneal Dialysis Catheter: What to Expect  Before starting peritoneal dialysis (PD), you need to have surgery to put a thin, flexible tube called a catheter into your belly. PD is a way to filter your blood when your kidneys can't. The catheter will be used for the PD treatments. This catheter is small, soft, and easy to keep hidden. The surgery to put in the catheter is usually done at least 2 weeks before PD is started. During dialysis, wastes, salt, and extra water are removed from the blood. In PD, these tasks are done by moving a fluid into and out of the belly. The fluid goes into the belly through the catheter at the start of each session, and then it drains out of the body through the catheter. This surgery is done using one of these techniques: Open technique. This is when the surgery is done through one large incision, or cut. Laparoscopic technique. This is when small cuts are made and a tube with a light and camera is inserted through one of the cuts to help do the surgery. The camera sends images to a video screen in the operating room. This lets the surgeon see inside the belly during the surgery. Tell a health care provider about: Any allergies you have. All medicines you take. These include vitamins, herbs, eye drops, and creams. Any problems you or family members have had with anesthesia. Any bleeding problems you have. Any health problems or surgeries you've had. Any history of smoking. Whether you're pregnant or may be pregnant. What are the risks? Your provider will talk with you about risks. These may include: Infection. Bleeding. A collection of blood near a cut. Damage to blood vessels, tissues, or organs in the belly area. Allergic reactions to medicines. Slow healing. Scarring or skin damage. Catheter problems after the surgery. The catheter may: Get blocked. Move out of place. Poke or wrap around intestines. Allow fluid to leak around  it. What happens before the surgery? When to stop eating and drinking Eat and drink only as you've been told. You may be told this: 8 hours before your surgery Stop eating most foods. Do not eat meat, fried foods, or fatty foods. Eat only light foods, such as toast or crackers. All liquids are OK except energy drinks and alcohol . 6 hours before your surgery Stop eating. Drink only clear liquids, such as water, clear fruit juice, black coffee, plain tea, and sports drinks. Do not drink energy drinks or alcohol . 2 hours before your surgery Stop drinking all liquids. You may be allowed to take medicines with small sips of water. If you do not eat and drink as told, your surgery may be delayed or canceled. Medicines Ask about changing or stopping: Any medicines you take. Any vitamins, herbs, or supplements you take. Do not take aspirin  or ibuprofen  unless you're told to. Surgery safety For your safety, you may: Need to wash your skin with a soap that kills germs. Get antibiotics. Have your surgery site marked. Have hair removed at the surgery site. General instructions Your provider will discuss the best site for the catheter to be placed. The site will be chosen: To help prevent the catheter from being flattened or damaged. To make it as comfortable as possible for you. You may have a CT scan or ultrasound of your belly. You may have a blood sample taken. Ask if you'll be staying overnight in the hospital. If you'll be going home right after the  surgery, plan to have a responsible adult: Drive you home from the hospital or clinic. You won't be allowed to drive. Stay with you for the time you're told. What happens during the surgery? An IV will be put into a vein in your hand or arm. You may be given: A sedative to help you relax. Anesthesia to keep you from feeling pain. If you're having an open surgery, one large cut will be made in the belly. If you're having laparoscopic  surgery, small cuts will be made in the belly. The catheter will be put in place. A short tunnel will be made under the skin to a place where the catheter exits the belly. Stitches will be placed around the catheter to hold it in place. Your cuts will be closed with stitches or staples. These steps may vary. Ask what you can expect. What happens after the surgery? You will be watched closely until you leave. This includes checking your pain level, blood pressure, heart rate, and breathing rate. You may have some pain. You'll be given pain medicine as needed. You'll be given instructions about how to care for your catheter and how it's used for the PD process. This information is not intended to replace advice given to you by your health care provider. Make sure you discuss any questions you have with your health care provider. Document Revised: 07/06/2022 Document Reviewed: 07/06/2022 Elsevier Patient Education  2024 [employer].

## 2023-12-11 NOTE — Progress Notes (Signed)
 12/11/2023  History of Present Illness: Trevor Meyer is a 58 y.o. male status post robotic assisted right inguinal hernia repair, laparoscopic peritoneal dialysis catheter placement, and open umbilical hernia repair on 08/11/2023.  Patient recently had issues with the PD catheter and KUB showed that the catheter had become displaced with the catheter being in the left lower quadrant rather than in the pelvis.  As such, he is scheduled for tomorrow 12/12/2023 for laparoscopic peritoneal dialysis catheter revision, with possible option of replacement.  The patient reports that he is otherwise been doing well and denies any abdominal pain, nausea, vomiting.  He reports feeling some soreness in the left lower quadrant at the site where the catheter is misplaced.  Denies any troubles with the incisions and denies any bulging or recurrence of his hernias.  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   a.) CTA chest 03/21/2020:  acute aortic syndrome with intramural hematoma extending from the LEFT subclavian artery origin to the diaphragmatic hiatus   CKD (chronic kidney disease), stage V (HCC)    Clotting disorder 2000   Hypertension 03/2020   Iron  deficiency anemia    Polycystic kidney disease    Right inguinal hernia    Secondary hyperparathyroidism of renal origin    Umbilical hernia      Past Surgical History: Past Surgical History:  Procedure Laterality Date   CAPD INSERTION N/A 08/11/2023   Procedure: LAPAROSCOPIC INSERTION CONTINUOUS AMBULATORY PERITONEAL DIALYSIS  (CAPD) CATHETER;  Surgeon: Desiderio Schanz, MD;  Location: [facility];  Service: General;  Laterality: N/A;   COLONOSCOPY N/A 08/31/2023   Procedure: COLONOSCOPY;  Surgeon: Jinny Carmine, MD;  Location: [facility];  Service: Endoscopy;  Laterality: N/A;   HEMORRHOID SURGERY N/A 08/31/2023   Procedure:  HEMORRHOIDECTOMY;  Surgeon: Desiderio Schanz, MD;  Location: [facility];  Service: General;  Laterality: N/A;   HEMOSTASIS CLIP PLACEMENT  08/31/2023   Procedure: CONTROL OF HEMORRHAGE, GI TRACT, ENDOSCOPIC, BY CLIPPING OR OVERSEWING;  Surgeon: Jinny Carmine, MD;  Location: [facility];  Service: Endoscopy;;   HERNIORRHAPHY, INGUINAL, ROBOT-ASSISTED, LAPAROSCOPIC Right 08/11/2023   Procedure: HERNIORRHAPHY, INGUINAL, ROBOT-ASSISTED, LAPAROSCOPIC;  Surgeon: Desiderio Schanz, MD;  Location: [facility];  Service: General;  Laterality: Right;   POLYPECTOMY  08/31/2023   Procedure: POLYPECTOMY, INTESTINE;  Surgeon: Jinny Carmine, MD;  Location: [facility];  Service: Endoscopy;;   RECTAL EXAM UNDER ANESTHESIA N/A 08/31/2023   Procedure: EXAM UNDER ANESTHESIA, RECTUM;  Surgeon: Desiderio Schanz, MD;  Location: [facility];  Service: General;  Laterality: N/A;   UMBILICAL HERNIA REPAIR N/A 08/11/2023   Procedure: REPAIR, HERNIA, UMBILICAL, ADULT;  Surgeon: Desiderio Schanz, MD;  Location: [facility];  Service: General;  Laterality: N/A;    Home Medications: Prior to Admission medications   Medication Sig Start Date End Date Taking? Authorizing Provider  amLODipine  (NORVASC ) 10 MG tablet Take 1 tablet (10 mg total) by mouth daily. 03/27/20 07/21/27 Yes Maree Hue, MD  cloNIDine  (CATAPRES ) 0.1 MG tablet Take 1 tablet (0.1 mg total) by mouth 2 (two) times daily. 03/26/20  Yes Maree Hue, MD  isosorbide  mononitrate (IMDUR ) 30 MG 24 hr tablet Take 1 tablet (30 mg total) by mouth daily. 03/27/20 07/21/27 Yes Maree Hue, MD  labetalol  (NORMODYNE ) 200 MG tablet Take 1 tablet (200 mg total) by mouth 2 (two) times daily. 03/26/20 07/21/26 Yes Maree Hue, MD  lactulose  (CHRONULAC ) 10  GM/15ML solution Take 30 mLs (20 g total) by mouth 2 (two) times daily as needed for mild constipation. Take until having at least one easy bowel movement daily 09/01/23  Yes Dezii, Alexandra, DO  pantoprazole  (PROTONIX ) 40 MG tablet Take 1 tablet (40 mg total)  by mouth daily. 08/18/23 12/11/23 Yes Lemon Raisin, MD  Propylene Glycol (LUBRICANT EYE DROP) 0.6 % SOLN Place 1-2 drops into both eyes 3 (three) times daily as needed (dry/irritated eyes.).   Yes [provider]  sodium zirconium cyclosilicate  (LOKELMA ) 5 g packet Take 10 g by mouth daily. 08/12/23  Yes Caleen Qualia, MD    Allergies: Allergies  Allergen Reactions   Hydralazine  Other (See Comments)    Flushing and tachycardia       Review of Systems: Review of Systems  Constitutional:  Negative for chills and fever.  Respiratory:  Negative for shortness of breath.   Cardiovascular:  Negative for chest pain.  Gastrointestinal:  Positive for abdominal pain (discomfort left lower quadrant). Negative for nausea and vomiting.    Physical Exam BP 137/80   Pulse 82   Temp 98.9 F (37.2 C) (Oral)   Ht 5' 6 (1.676 m)   Wt 149 lb (67.6 kg)   SpO2 94%   BMI 24.05 kg/m  CONSTITUTIONAL: No acute distress, well-nourished. HEENT:  Normocephalic, atraumatic, extraocular motion intact. RESPIRATORY:  Lungs are clear, and breath sounds are equal bilaterally. Normal respiratory effort without pathologic use of accessory muscles. CARDIOVASCULAR: Heart is regular without murmurs, gallops, or rubs. GI: The abdomen is soft, nondistended, nontender to palpation.  Patient's incisions are well-healed.  Peritoneal dialysis catheter in place without any evidence of infection or tubing complications.  No evidence of hernia recurrence.  NEUROLOGIC:  Motor and sensation is grossly normal.  Cranial nerves are grossly intact. PSYCH:  Alert and oriented to person, place and time. Affect is normal.  Labs/Imaging: KUB on 11/30/2023: FINDINGS: Peritoneal dialysis catheter terminates in the left lower quadrant. Bowel gas pattern is unremarkable. Phleboliths in the right anatomic pelvis. Small right pleural effusion.   IMPRESSION: No acute findings in the abdomen.  Assessment and Plan: This is a 58  y.o. male with peritoneal dialysis catheter displacement.  - Discussed with patient the findings on his x-ray study.  The catheter has shifted to the left lower quadrant rather than being in the pelvis.  This could be as a result of adhesions or potentially of the bowels pushing the catheter out of place.  Discussed with him the plan for a laparoscopic assisted peritoneal dialysis catheter revision.  Discussed with him the planned incisions, risks of bleeding, infection, injury to surrounding structures, the possibility of having to replace the catheter, possibility of removing fibrin or clot formation within the catheter, that this would be an outpatient procedure, postoperative activity restrictions, pain control, and he is willing to proceed. - All of his questions have been answered.  I spent 30 minutes dedicated to the care of this patient on the date of this encounter to include pre-visit review of records, face-to-face time with the patient discussing diagnosis and management, and any post-visit coordination of care.   Aloysius Sheree Plant, MD [facility]

## 2023-12-12 ENCOUNTER — Encounter
Admission: RE | Disposition: A | Discharge status: Home / Self Care | Payer: Self-pay | Source: Home / Self Care | Attending: Surgery

## 2023-12-12 ENCOUNTER — Other Ambulatory Visit: Payer: Self-pay

## 2023-12-12 ENCOUNTER — Encounter: Payer: Self-pay | Admitting: Surgery

## 2023-12-12 ENCOUNTER — Ambulatory Visit
Admission: RE | Admit: 2023-12-12 | Discharge: 2023-12-12 | Disposition: A | Discharge status: Home / Self Care | Attending: Surgery | Admitting: Surgery

## 2023-12-12 ENCOUNTER — Ambulatory Visit

## 2023-12-12 DIAGNOSIS — N186 End stage renal disease: Secondary | ICD-10-CM | POA: Diagnosis not present

## 2023-12-12 DIAGNOSIS — N185 Chronic kidney disease, stage 5: Secondary | ICD-10-CM

## 2023-12-12 DIAGNOSIS — T85611A Breakdown (mechanical) of intraperitoneal dialysis catheter, initial encounter: Secondary | ICD-10-CM

## 2023-12-12 DIAGNOSIS — Z01818 Encounter for other preprocedural examination: Secondary | ICD-10-CM

## 2023-12-12 DIAGNOSIS — Z992 Dependence on renal dialysis: Secondary | ICD-10-CM | POA: Diagnosis not present

## 2023-12-12 HISTORY — PX: CAPD REVISION: SHX5260

## 2023-12-12 LAB — POCT I-STAT, CHEM 8
BUN: 104 mg/dL — ABNORMAL HIGH (ref 6–20)
Calcium, Ion: 0.78 mmol/L — CL (ref 1.15–1.40)
Chloride: 103 mmol/L (ref 98–111)
Creatinine, Ser: 12.4 mg/dL — ABNORMAL HIGH (ref 0.61–1.24)
Glucose, Bld: 100 mg/dL — ABNORMAL HIGH (ref 70–99)
HCT: 37 % — ABNORMAL LOW (ref 39.0–52.0)
Hemoglobin: 12.6 g/dL — ABNORMAL LOW (ref 13.0–17.0)
Potassium: 4.2 mmol/L (ref 3.5–5.1)
Sodium: 138 mmol/L (ref 135–145)
TCO2: 26 mmol/L (ref 22–32)

## 2023-12-12 SURGERY — LAPAROSCOPIC REVISION CONTINUOUS AMBULATORY PERITONEAL DIALYSIS  (CAPD) CATHETER
Anesthesia: General | Site: Abdomen

## 2023-12-12 MED ORDER — LIDOCAINE HCL (CARDIAC) PF 100 MG/5ML IV SOSY
PREFILLED_SYRINGE | INTRAVENOUS | Status: DC | PRN
  Administered 2023-12-12: 100 mg via INTRAVENOUS

## 2023-12-12 MED ORDER — PHENYLEPHRINE 80 MCG/ML (10ML) SYRINGE FOR IV PUSH (FOR BLOOD PRESSURE SUPPORT)
PREFILLED_SYRINGE | INTRAVENOUS | Status: DC | PRN
  Administered 2023-12-12: 80 ug via INTRAVENOUS

## 2023-12-12 MED ORDER — ACETAMINOPHEN 500 MG PO TABS: 1000.0000 mg | ORAL_TABLET | Freq: Four times a day (QID) | ORAL | Status: AC | PRN

## 2023-12-12 MED ORDER — FENTANYL CITRATE (PF) 100 MCG/2ML IJ SOLN: 25.0000 ug | INTRAMUSCULAR | Status: DC | PRN

## 2023-12-12 MED ORDER — MIDAZOLAM HCL (PF) 2 MG/2ML IJ SOLN
INTRAMUSCULAR | Status: DC | PRN
  Administered 2023-12-12: 2 mg via INTRAVENOUS

## 2023-12-12 MED ORDER — ONDANSETRON HCL 4 MG/2ML IJ SOLN
INTRAMUSCULAR | Status: DC | PRN
  Administered 2023-12-12 (×2): 4 mg via INTRAVENOUS

## 2023-12-12 MED ORDER — PHENYLEPHRINE HCL-NACL 20-0.9 MG/250ML-% IV SOLN
INTRAVENOUS | Status: DC | PRN
  Administered 2023-12-12: 25 ug/min via INTRAVENOUS

## 2023-12-12 MED ORDER — GABAPENTIN 300 MG PO CAPS
ORAL_CAPSULE | ORAL | Status: AC
  Filled 2023-12-12: qty 1

## 2023-12-12 MED ORDER — SUCCINYLCHOLINE CHLORIDE 200 MG/10ML IV SOSY
PREFILLED_SYRINGE | INTRAVENOUS | Status: DC | PRN
  Administered 2023-12-12: 100 mg via INTRAVENOUS

## 2023-12-12 MED ORDER — 0.9 % SODIUM CHLORIDE (POUR BTL) OPTIME
TOPICAL | Status: DC | PRN
  Administered 2023-12-12: 500 mL

## 2023-12-12 MED ORDER — ACETAMINOPHEN 500 MG PO TABS
ORAL_TABLET | ORAL | Status: AC
  Filled 2023-12-12: qty 2

## 2023-12-12 MED ORDER — BUPIVACAINE-EPINEPHRINE 0.5% -1:200000 IJ SOLN
INTRAMUSCULAR | Status: DC | PRN
  Administered 2023-12-12: 30 mL

## 2023-12-12 MED ORDER — FENTANYL CITRATE (PF) 100 MCG/2ML IJ SOLN
INTRAMUSCULAR | Status: DC | PRN
  Administered 2023-12-12: 50 ug via INTRAVENOUS

## 2023-12-12 MED ORDER — BUPIVACAINE-EPINEPHRINE (PF) 0.5% -1:200000 IJ SOLN
INTRAMUSCULAR | Status: AC
  Filled 2023-12-12: qty 30

## 2023-12-12 MED ORDER — PHENYLEPHRINE HCL-NACL 20-0.9 MG/250ML-% IV SOLN
INTRAVENOUS | Status: AC
  Filled 2023-12-12: qty 250

## 2023-12-12 MED ORDER — GLYCOPYRROLATE 0.2 MG/ML IJ SOLN
INTRAMUSCULAR | Status: DC | PRN
  Administered 2023-12-12: .2 mg via INTRAVENOUS

## 2023-12-12 MED ORDER — DEXAMETHASONE SOD PHOSPHATE PF 10 MG/ML IJ SOLN
INTRAMUSCULAR | Status: DC | PRN
  Administered 2023-12-12: 5 mg via INTRAVENOUS

## 2023-12-12 MED ORDER — OXYCODONE HCL 5 MG PO TABS: 5.0000 mg | ORAL_TABLET | ORAL | 0 refills | Status: AC | PRN

## 2023-12-12 MED ORDER — ROCURONIUM BROMIDE 100 MG/10ML IV SOLN
INTRAVENOUS | Status: DC | PRN
  Administered 2023-12-12: 50 mg via INTRAVENOUS

## 2023-12-12 MED ORDER — OXYCODONE HCL 5 MG/5ML PO SOLN: 5.0000 mg | Freq: Once | ORAL | Status: DC | PRN

## 2023-12-12 MED ORDER — FENTANYL CITRATE (PF) 100 MCG/2ML IJ SOLN
INTRAMUSCULAR | Status: AC
  Filled 2023-12-12: qty 2

## 2023-12-12 MED ORDER — SUGAMMADEX SODIUM 200 MG/2ML IV SOLN
INTRAVENOUS | Status: DC | PRN
  Administered 2023-12-12: 300 mg via INTRAVENOUS

## 2023-12-12 MED ORDER — HEPARIN SODIUM (PORCINE) 5000 UNIT/ML IJ SOLN
INTRAMUSCULAR | Status: AC
  Filled 2023-12-12: qty 1

## 2023-12-12 MED ORDER — CHLORHEXIDINE GLUCONATE 0.12 % MT SOLN
OROMUCOSAL | Status: AC
  Filled 2023-12-12: qty 15

## 2023-12-12 MED ORDER — MIDAZOLAM HCL 2 MG/2ML IJ SOLN
INTRAMUSCULAR | Status: AC
  Filled 2023-12-12: qty 2

## 2023-12-12 MED ORDER — OXYCODONE HCL 5 MG PO TABS: 5.0000 mg | ORAL_TABLET | Freq: Once | ORAL | Status: DC | PRN

## 2023-12-12 MED ORDER — CEFAZOLIN SODIUM-DEXTROSE 2-4 GM/100ML-% IV SOLN
INTRAVENOUS | Status: AC
  Filled 2023-12-12: qty 100

## 2023-12-12 MED ORDER — HEPARIN 5000 UNITS IN NS 1000 ML (FLUSH)
INTRAMUSCULAR | Status: DC | PRN
  Administered 2023-12-12: 900 mL via INTRAMUSCULAR

## 2023-12-12 MED ORDER — PROPOFOL 10 MG/ML IV BOLUS
INTRAVENOUS | Status: DC | PRN
  Administered 2023-12-12: 100 mg via INTRAVENOUS

## 2023-12-12 SURGICAL SUPPLY — 35 items
BLADE SURG SZ11 CARB STEEL (BLADE) ×1 IMPLANT
CATH EXTENDED DIALYSIS (CATHETERS) ×1 IMPLANT
DEFOGGER SCOPE WARM SEASHARP (MISCELLANEOUS) ×1 IMPLANT
DERMABOND ADVANCED .7 DNX12 (GAUZE/BANDAGES/DRESSINGS) ×1 IMPLANT
ELECTRODE REM PT RTRN 9FT ADLT (ELECTROSURGICAL) ×1 IMPLANT
GAUZE SPONGE 4X4 12PLY STRL (GAUZE/BANDAGES/DRESSINGS) IMPLANT
GLOVE SURG SYN 7.0 PF PI (GLOVE) ×1 IMPLANT
GLOVE SURG SYN 7.5 PF PI (GLOVE) ×1 IMPLANT
GOWN STRL REUS W/ TWL LRG LVL3 (GOWN DISPOSABLE) ×2 IMPLANT
GRASPER SUT TROCAR 14GX15 (MISCELLANEOUS) IMPLANT
IRRIGATION STRYKERFLOW (MISCELLANEOUS) IMPLANT
IV 0.9% NACL 1000 ML (IV SOLUTION) ×1 IMPLANT
KIT TURNOVER KIT A (KITS) ×1 IMPLANT
LABEL OR SOLS (LABEL) ×1 IMPLANT
MANIFOLD NEPTUNE II (INSTRUMENTS) ×1 IMPLANT
MINICAP W/POVIDONE IODINE SOL (MISCELLANEOUS) ×1 IMPLANT
NDL HYPO 22X1.5 SAFETY MO (MISCELLANEOUS) ×1 IMPLANT
NDL INSUFFLATION 14GA 120MM (NEEDLE) ×1 IMPLANT
NS IRRIG 500ML POUR BTL (IV SOLUTION) ×1 IMPLANT
PACK LAP CHOLECYSTECTOMY (MISCELLANEOUS) ×1 IMPLANT
SET CYSTO IRRIGATION (SET/KITS/TRAYS/PACK) ×1 IMPLANT
SET TRANSFER 6 W/TWIST CLAMP 5 (SET/KITS/TRAYS/PACK) IMPLANT
SET TUBE SMOKE EVAC HIGH FLOW (TUBING) ×1 IMPLANT
SLEEVE ADV FIXATION 5X100MM (TROCAR) ×2 IMPLANT
SOLN STERILE WATER 500 ML (IV SOLUTION) ×1 IMPLANT
SPONGE DRAIN TRACH 4X4 STRL 2S (GAUZE/BANDAGES/DRESSINGS) ×1 IMPLANT
STYLET FALLER (MISCELLANEOUS) IMPLANT
STYLET FALLER MEDIONICS (MISCELLANEOUS) IMPLANT
SUT ETHILON 2 0 FS 18 (SUTURE) ×1 IMPLANT
SUT VIC AB 3-0 SH 27X BRD (SUTURE) ×1 IMPLANT
SUT VICRYL 0 UR6 27IN ABS (SUTURE) ×1 IMPLANT
SUTURE MNCRL 4-0 27XMF (SUTURE) ×1 IMPLANT
SYSTEM KII FIOS ACES ABD 5X100 (TROCAR) ×1 IMPLANT
TRAP FLUID SMOKE EVACUATOR (MISCELLANEOUS) ×1 IMPLANT
TROCAR KII BLADELESS 5X150 (TROCAR) ×1 IMPLANT

## 2023-12-12 NOTE — Transfer of Care (Signed)
 Immediate Anesthesia Transfer of Care Note  Patient: Trevor Meyer  Procedure(s) Performed: LAPAROSCOPIC REVISION CONTINUOUS AMBULATORY PERITONEAL DIALYSIS  (CAPD) CATHETER (Abdomen)  Patient Location: PACU  Anesthesia Type:General  Level of Consciousness: awake, drowsy, and patient cooperative  Airway & Oxygen Therapy: Patient Spontanous Breathing and Patient connected to face mask oxygen  Post-op Assessment: Report given to RN and Post -op Vital signs reviewed and stable  Post vital signs: Reviewed and stable  Last Vitals:  Vitals Value Taken Time  BP 128/88 12/12/23 18:03  Temp    Pulse 85 12/12/23 18:06  Resp 18 12/12/23 18:06  SpO2 100 % 12/12/23 18:06  Vitals shown include unfiled device data.  Last Pain:  Vitals:   12/12/23 1409  PainSc: 0-No pain         Complications: No notable events documented.

## 2023-12-12 NOTE — Interval H&P Note (Signed)
 History and Physical Interval Note:  12/12/2023 4:14 PM  Trevor Meyer  has presented today for surgery, with the diagnosis of PD catheter malfunction.  The various methods of treatment have been discussed with the patient and family. After consideration of risks, benefits and other options for treatment, the patient has consented to  Procedure(s): LAPAROSCOPIC REVISION CONTINUOUS AMBULATORY PERITONEAL DIALYSIS  (CAPD) CATHETER (N/A) as a surgical intervention.  The patient's history has been reviewed, patient examined, no change in status, stable for surgery.  I have reviewed the patient's chart and labs.  Questions were answered to the patient's satisfaction.     [name]

## 2023-12-12 NOTE — Anesthesia Procedure Notes (Signed)
 Procedure Name: Intubation Date/Time: 12/12/2023 4:37 PM  Performed by: Ledora Duncan, CRNAPre-anesthesia Checklist: Patient identified, Emergency Drugs available, Suction available and Patient being monitored Patient Re-evaluated:Patient Re-evaluated prior to induction Oxygen Delivery Method: Circle system utilized Preoxygenation: Pre-oxygenation with 100% oxygen Induction Type: IV induction Ventilation: Mask ventilation without difficulty Laryngoscope Size: McGrath and 3 Grade View: Grade I Tube type: Oral Tube size: 7.0 mm Number of attempts: 1 Airway Equipment and Method: Stylet Placement Confirmation: ETT inserted through vocal cords under direct vision, positive ETCO2 and breath sounds checked- equal and bilateral Secured at: 21 cm Tube secured with: Tape Dental Injury: Teeth and Oropharynx as per pre-operative assessment

## 2023-12-12 NOTE — Anesthesia Preprocedure Evaluation (Signed)
 Anesthesia Evaluation  Patient identified by MRN, date of birth, ID band Patient awake    Reviewed: Allergy & Precautions, H&P , NPO status , Patient's Chart, lab work & pertinent test results, reviewed documented beta blocker date and time   History of Anesthesia Complications Negative for: history of anesthetic complications  Airway Mallampati: II  TM Distance: >3 FB Neck ROM: full    Dental  (+) Teeth Intact, Dental Advidsory Given   Pulmonary neg pulmonary ROS   Pulmonary exam normal        Cardiovascular Exercise Tolerance: Poor hypertension, On Medications and Pt. on home beta blockers (-) angina (-) Past MI and (-) Cardiac Stents Normal cardiovascular exam(-) dysrhythmias (-) Valvular Problems/Murmurs Rhythm:regular Rate:Normal     Neuro/Psych negative neurological ROS  negative psych ROS   GI/Hepatic negative GI ROS, Neg liver ROS,,,  Endo/Other  negative endocrine ROS    Renal/GU Renal disease     Musculoskeletal   Abdominal   Peds  Hematology  (+) Blood dyscrasia, anemia   Anesthesia Other Findings Rectal bleeding  Past Medical History: No date: Anemia of chronic renal failure No date: Aortic atherosclerosis (HCC) 03/21/2020: Aortic mural thrombus (HCC)     Comment:  a.) CTA chest 03/21/2020:  acute aortic syndrome with               intramural hematoma extending from the LEFT subclavian               artery origin to the diaphragmatic hiatus No date: CKD (chronic kidney disease), stage V (HCC) 2000: Clotting disorder (HCC) 03/2020: Hypertension No date: Iron  deficiency anemia No date: Polycystic kidney disease No date: Right inguinal hernia No date: Secondary hyperparathyroidism of renal origin (HCC) No date: Umbilical hernia History reviewed. No pertinent surgical history.   Reproductive/Obstetrics negative OB ROS                              Anesthesia  Physical Anesthesia Plan  ASA: 3  Anesthesia Plan: General ETT   Post-op Pain Management:    Induction: Intravenous  PONV Risk Score and Plan: 3 and Ondansetron , Dexamethasone  and Midazolam   Airway Management Planned: Oral ETT  Additional Equipment:   Intra-op Plan:   Post-operative Plan: Extubation in OR  Informed Consent: I have reviewed the patients History and Physical, chart, labs and discussed the procedure including the risks, benefits and alternatives for the proposed anesthesia with the patient or authorized representative who has indicated his/her understanding and acceptance.     Dental Advisory Given  Plan Discussed with: Anesthesiologist, CRNA and Surgeon  Anesthesia Plan Comments: (Patient consented for risks of anesthesia including but not limited to:  - adverse reactions to medications - damage to eyes, teeth, lips or other oral mucosa - nerve damage due to positioning  - sore throat or hoarseness - Damage to heart, brain, nerves, lungs, other parts of body or loss of life  Patient voiced understanding and assent.)        Anesthesia Quick Evaluation

## 2023-12-12 NOTE — Discharge Instructions (Signed)
 Discharge instructions: 1.  Patient may shower, but do not scrub wounds heavily and dab dry only. 2.  Do not submerge in pool/tub. 3.  May change dressing once daily as you are currently doing at home starting tomorrow. 4.  Do not apply ointments or hydrogen peroxide to the wounds. 5.  May apply ice packs to the wounds for comfort. 6.  Do not drive while taking narcotics for pain control.  Prior to driving, make sure you are able to rotate right and left to look at blindspots without significant pain or discomfort. 7. No heavy lifting or pushing of more than 10-15 lbs for 4 weeks.

## 2023-12-12 NOTE — Op Note (Addendum)
  Procedure Date:  12/12/2023  Pre-operative Diagnosis:  Peritoneal dialysis catheter malfunction  Post-operative Diagnosis:  Peritoneal dialysis catheter migration and clotting.  Procedure:  Laparoscopic peritoneal dialysis catheter revision.  Surgeon:  Aloysius Sheree Plant, MD  Anesthesia:  General endotracheal  Estimated Blood Loss:  10 ml  Specimens:  None  Complications:  None  Indications for Procedure:  This is a 58 y.o. male status post laparoscopic assisted peritoneal dialysis catheter placement on 08/11/2023.  Patient had recently issues with the catheter not working well and despite of using tPA it was not working.  KUB showed that the catheter had migrated to the left lower quadrant instead of being in the pelvis.  As such, he presents today for laparoscopic assisted peritoneal dialysis catheter revision.  Surgery was discussed with him at length including the planned incisions, risks of bleeding, infection, injury to surrounding structures, the potential for catheter replacement, postoperative activity restrictions, pain control, and he is willing to proceed.  Description of Procedure: The patient was correctly identified in the preoperative area and brought into the operating room.  The patient was placed supine with VTE prophylaxis in place.  Appropriate time-outs were performed.  Anesthesia was induced and the patient was intubated.  Appropriate antibiotics were infused.  The abdomen was prepped and draped in a sterile fashion.  A stab incision was made in the left upper quadrant and Veress needle insufflation was done with appropriate clicks and opening pressures.  Once the abdomen was insufflated, 5 mm laparoscopic port was placed in the right abdomen under direct visualization using Optiview technique.  The abdomen was inspected at this point and it was noted that the catheter was in the left lower quadrant and adhered adjacent to the sigmoid colon and the lateral abdominal wall.   An additional 5 mm port was placed in the right lower quadrant and using a laparoscopic grasper the catheter was pulled out of the way.  It was then pulled through the port and fibrin clot was manually removed from the tip of the catheter.  The catheter was then placed back into the abdomen and positioned in the pelvis as it should be.  Suction irrigator was used to try to dissect part of the lateral abdominal wall where it was adhered to the sigmoid colon.  This was heavily scarred and we did not attempt further to dissect this area to avoid any potential bowel injury.  It is unclear why this area of adhesion and scarring had formed.  With the catheter now in the pelvis, the catheter was tested for flow/drainage using heparinized saline.  It had good flow both in and out.  At this point, laparoscopic ports were removed and pneumoperitoneum released.  30 ml of local anesthetic was infiltrated onto the incisions.  The port incisions were closed with 4-0 Monocryl.  The wounds were cleaned and sealed with DermaBond.  A Biopatch was placed at the catheter exit site and the catheter was dressed with 4x4 gauze and porous tape.  The patient was emerged from anesthesia and extubated and brought to the recovery room for further management.  The patient tolerated the procedure well and all counts were correct at the end of the case.   Aloysius Sheree Plant, MD

## 2023-12-12 NOTE — Anesthesia Postprocedure Evaluation (Signed)
 Anesthesia Post Note  Patient: Trevor Meyer  Procedure(s) Performed: LAPAROSCOPIC REVISION CONTINUOUS AMBULATORY PERITONEAL DIALYSIS  (CAPD) CATHETER (Abdomen)  Patient location during evaluation: PACU Anesthesia Type: General Level of consciousness: awake and alert Pain management: pain level controlled Vital Signs Assessment: post-procedure vital signs reviewed and stable Respiratory status: spontaneous breathing, nonlabored ventilation, respiratory function stable and patient connected to nasal cannula oxygen Cardiovascular status: blood pressure returned to baseline and stable Postop Assessment: no apparent nausea or vomiting Anesthetic complications: no   No notable events documented.   Last Vitals:  Vitals:   12/12/23 1405 12/12/23 1803  BP: (!) 139/90 128/88  Pulse: 72 91  Resp: 16 20  Temp: 36.7 C   SpO2: 97% 100%    Last Pain:  Vitals:   12/12/23 1803  PainSc: 0-No pain                 Debby Mines

## 2023-12-13 ENCOUNTER — Encounter: Payer: Self-pay | Admitting: Surgery

## 2023-12-27 ENCOUNTER — Encounter: Admitting: Physician Assistant
